# Patient Record
Sex: Male | Born: 1965 | Race: Black or African American | Hispanic: No | Marital: Married | Smoking: Never smoker
Health system: Southern US, Community
[De-identification: ages and names within clinical notes are randomized; demographics above are authoritative.]

## PROBLEM LIST (undated history)

## (undated) DIAGNOSIS — I10 Essential (primary) hypertension: Secondary | ICD-10-CM

---

## 2008-06-27 ENCOUNTER — Encounter: Admission: RE | Admit: 2008-06-27 | Discharge: 2008-06-27 | Payer: Self-pay | Admitting: Family Medicine

## 2010-06-02 ENCOUNTER — Encounter: Admission: RE | Admit: 2010-06-02 | Discharge: 2010-06-02 | Payer: Self-pay | Admitting: Orthopedic Surgery

## 2014-03-07 ENCOUNTER — Other Ambulatory Visit: Payer: Self-pay | Admitting: Family Medicine

## 2014-03-07 ENCOUNTER — Ambulatory Visit
Admission: RE | Admit: 2014-03-07 | Discharge: 2014-03-07 | Disposition: A | Discharge status: Home / Self Care | Payer: BC Managed Care – PPO | Source: Ambulatory Visit | Attending: Family Medicine | Admitting: Family Medicine

## 2014-03-07 DIAGNOSIS — IMO0001 Reserved for inherently not codable concepts without codable children: Secondary | ICD-10-CM

## 2014-03-07 DIAGNOSIS — M7918 Myalgia, other site: Secondary | ICD-10-CM

## 2014-12-01 ENCOUNTER — Other Ambulatory Visit: Payer: Self-pay | Admitting: Family Medicine

## 2014-12-01 DIAGNOSIS — K6289 Other specified diseases of anus and rectum: Secondary | ICD-10-CM

## 2014-12-01 DIAGNOSIS — R102 Pelvic and perineal pain: Secondary | ICD-10-CM

## 2014-12-08 ENCOUNTER — Ambulatory Visit
Admission: RE | Admit: 2014-12-08 | Discharge: 2014-12-08 | Disposition: A | Discharge status: Home / Self Care | Payer: BLUE CROSS/BLUE SHIELD | Source: Ambulatory Visit | Attending: Family Medicine | Admitting: Family Medicine

## 2014-12-08 ENCOUNTER — Other Ambulatory Visit: Payer: Self-pay | Admitting: Family Medicine

## 2014-12-08 DIAGNOSIS — K6289 Other specified diseases of anus and rectum: Secondary | ICD-10-CM

## 2014-12-08 DIAGNOSIS — R102 Pelvic and perineal pain: Secondary | ICD-10-CM

## 2014-12-08 MED ORDER — IOPAMIDOL (ISOVUE-300) INJECTION 61%
125.0000 mL | Freq: Once | INTRAVENOUS | Status: AC | PRN
  Administered 2014-12-08: 125 mL via INTRAVENOUS

## 2018-07-18 ENCOUNTER — Emergency Department (HOSPITAL_BASED_OUTPATIENT_CLINIC_OR_DEPARTMENT_OTHER): Payer: BLUE CROSS/BLUE SHIELD

## 2018-07-18 ENCOUNTER — Inpatient Hospital Stay (HOSPITAL_BASED_OUTPATIENT_CLINIC_OR_DEPARTMENT_OTHER)
Admission: EM | Admit: 2018-07-18 | Discharge: 2018-07-27 | DRG: 374 | Disposition: A | Discharge status: Home / Self Care | Payer: BLUE CROSS/BLUE SHIELD | Attending: Internal Medicine | Admitting: Internal Medicine

## 2018-07-18 ENCOUNTER — Other Ambulatory Visit: Payer: Self-pay

## 2018-07-18 ENCOUNTER — Encounter (HOSPITAL_BASED_OUTPATIENT_CLINIC_OR_DEPARTMENT_OTHER): Payer: Self-pay | Admitting: Emergency Medicine

## 2018-07-18 DIAGNOSIS — E875 Hyperkalemia: Secondary | ICD-10-CM | POA: Diagnosis present

## 2018-07-18 DIAGNOSIS — K8689 Other specified diseases of pancreas: Secondary | ICD-10-CM

## 2018-07-18 DIAGNOSIS — Z79899 Other long term (current) drug therapy: Secondary | ICD-10-CM

## 2018-07-18 DIAGNOSIS — N179 Acute kidney failure, unspecified: Secondary | ICD-10-CM | POA: Diagnosis present

## 2018-07-18 DIAGNOSIS — R109 Unspecified abdominal pain: Secondary | ICD-10-CM | POA: Diagnosis not present

## 2018-07-18 DIAGNOSIS — E860 Dehydration: Secondary | ICD-10-CM | POA: Diagnosis present

## 2018-07-18 DIAGNOSIS — R160 Hepatomegaly, not elsewhere classified: Secondary | ICD-10-CM | POA: Diagnosis present

## 2018-07-18 DIAGNOSIS — K640 First degree hemorrhoids: Secondary | ICD-10-CM | POA: Diagnosis present

## 2018-07-18 DIAGNOSIS — C182 Malignant neoplasm of ascending colon: Secondary | ICD-10-CM | POA: Diagnosis present

## 2018-07-18 DIAGNOSIS — K56609 Unspecified intestinal obstruction, unspecified as to partial versus complete obstruction: Secondary | ICD-10-CM | POA: Diagnosis present

## 2018-07-18 DIAGNOSIS — K668 Other specified disorders of peritoneum: Secondary | ICD-10-CM

## 2018-07-18 DIAGNOSIS — Z8042 Family history of malignant neoplasm of prostate: Secondary | ICD-10-CM

## 2018-07-18 DIAGNOSIS — Z7189 Other specified counseling: Secondary | ICD-10-CM

## 2018-07-18 DIAGNOSIS — C799 Secondary malignant neoplasm of unspecified site: Secondary | ICD-10-CM

## 2018-07-18 DIAGNOSIS — J90 Pleural effusion, not elsewhere classified: Secondary | ICD-10-CM | POA: Diagnosis present

## 2018-07-18 DIAGNOSIS — C787 Secondary malignant neoplasm of liver and intrahepatic bile duct: Secondary | ICD-10-CM

## 2018-07-18 DIAGNOSIS — Z800 Family history of malignant neoplasm of digestive organs: Secondary | ICD-10-CM

## 2018-07-18 DIAGNOSIS — K859 Acute pancreatitis without necrosis or infection, unspecified: Secondary | ICD-10-CM | POA: Diagnosis present

## 2018-07-18 DIAGNOSIS — I10 Essential (primary) hypertension: Secondary | ICD-10-CM | POA: Diagnosis present

## 2018-07-18 HISTORY — DX: Essential (primary) hypertension: I10

## 2018-07-18 LAB — COMPREHENSIVE METABOLIC PANEL
ALBUMIN: 3.3 g/dL — AB (ref 3.5–5.0)
ALK PHOS: 335 U/L — AB (ref 38–126)
ALT: 45 U/L — AB (ref 0–44)
AST: 58 U/L — AB (ref 15–41)
Anion gap: 11 (ref 5–15)
BUN: 17 mg/dL (ref 6–20)
CALCIUM: 12.6 mg/dL — AB (ref 8.9–10.3)
CO2: 23 mmol/L (ref 22–32)
Chloride: 94 mmol/L — ABNORMAL LOW (ref 98–111)
Creatinine, Ser: 1.75 mg/dL — ABNORMAL HIGH (ref 0.61–1.24)
GFR calc Af Amer: 51 mL/min — ABNORMAL LOW (ref >60)
GFR calc non Af Amer: 44 mL/min — ABNORMAL LOW (ref >60)
GLUCOSE: 115 mg/dL — AB (ref 70–99)
Potassium: 4.7 mmol/L (ref 3.5–5.1)
SODIUM: 128 mmol/L — AB (ref 135–145)
Total Bilirubin: 1 mg/dL (ref 0.3–1.2)
Total Protein: 8 g/dL (ref 6.5–8.1)

## 2018-07-18 LAB — CBC
HCT: 41 % (ref 39.0–52.0)
Hemoglobin: 13.3 g/dL (ref 13.0–17.0)
MCH: 27.5 pg (ref 26.0–34.0)
MCHC: 32.4 g/dL (ref 30.0–36.0)
MCV: 84.7 fL (ref 80.0–100.0)
PLATELETS: 409 10*3/uL — AB (ref 150–400)
RBC: 4.84 MIL/uL (ref 4.22–5.81)
RDW: 13 % (ref 11.5–15.5)
WBC: 14.1 10*3/uL — ABNORMAL HIGH (ref 4.0–10.5)
nRBC: 0 % (ref 0.0–0.2)

## 2018-07-18 LAB — LIPASE, BLOOD: Lipase: 1104 U/L — ABNORMAL HIGH (ref 11–51)

## 2018-07-18 MED ORDER — SODIUM CHLORIDE 0.9 % IV BOLUS
500.0000 mL | Freq: Once | INTRAVENOUS | Status: AC
  Administered 2018-07-18: 500 mL via INTRAVENOUS

## 2018-07-18 MED ORDER — IOPAMIDOL (ISOVUE-300) INJECTION 61%
100.0000 mL | Freq: Once | INTRAVENOUS | Status: AC | PRN
  Administered 2018-07-18: 100 mL via INTRAVENOUS

## 2018-07-18 NOTE — ED Provider Notes (Signed)
[facility] EMERGENCY DEPARTMENT Provider Note   CSN: [id number] Arrival date & time: 07/18/18  2030     History   Chief Complaint Chief Complaint  Patient presents with  . Abdominal Pain    HPI Jeffery Roman is a 52 y.o. male.  HPI Patient presents with abdominal pain in his upper abdomen for the last 3 weeks.  States he went to see his doctor and they did not find a clear cause of the pain but found out he had a likely prostatitis.  Has been on antibiotics.  Then developed some nausea and vomiting.  Thought that it may be related to the ciprofloxacin and his medicines were changed.  Now has more nausea vomiting.  Had been on omeprazole.  Pain is worse with eating.  States he has been eating less because he know what will make him hurt and potentially vomit.  States his stool has also become smaller like pellets per Past Medical History:  Diagnosis Date  . Hypertension     There are no active problems to display for this patient.   History reviewed. No pertinent surgical history.      Home Medications    Prior to Admission medications   Not on File    Family History No family history on file.  Social History Social History   Tobacco Use  . Smoking status: Never Smoker  . Smokeless tobacco: Never Used  Substance Use Topics  . Alcohol use: Not on file  . Drug use: Not on file     Allergies   Ciprofloxacin and Lisinopril   Review of Systems Review of Systems  Constitutional: Positive for appetite change. Negative for fever.  Respiratory: Negative for shortness of breath.   Cardiovascular: Negative for chest pain.  Gastrointestinal: Positive for abdominal pain.  Genitourinary: Positive for dysuria.  Musculoskeletal: Negative for back pain.  Skin: Negative for rash.  Neurological: Negative for weakness.  Hematological: Negative for adenopathy.  Psychiatric/Behavioral: Negative for confusion.     Physical Exam Updated Vital Signs BP  (!) 147/92   Pulse (!) 105   Temp 98.7 F (37.1 C) (Oral)   Resp 16   Ht 5\' 11"  (1.803 m)   Wt 89.4 kg   SpO2 98%   BMI 27.48 kg/m   Physical Exam  Constitutional: He appears well-developed.  HENT:  Head: Normocephalic.  Cardiovascular: Normal rate.  Pulmonary/Chest: Breath sounds normal.  Abdominal: Normal appearance.  Epigastric tenderness and tightness to left upper abdomen.  No fullness.  No rebound or guarding.  Neurological: He is alert.  Skin: Skin is warm. Capillary refill takes less than 2 seconds.     ED Treatments / Results  Labs (all labs ordered are listed, but only abnormal results are displayed) Labs Reviewed  LIPASE, BLOOD - Abnormal; Notable for the following components:      Result Value   Lipase 1,104 (*)    All other components within normal limits  COMPREHENSIVE METABOLIC PANEL - Abnormal; Notable for the following components:   Sodium 128 (*)    Chloride 94 (*)    Glucose, Bld 115 (*)    Creatinine, Ser 1.75 (*)    Calcium 12.6 (*)    Albumin 3.3 (*)    AST 58 (*)    ALT 45 (*)    Alkaline Phosphatase 335 (*)    GFR calc non Af Amer 44 (*)    GFR calc Af Amer 51 (*)    All other components  within normal limits  CBC - Abnormal; Notable for the following components:   WBC 14.1 (*)    Platelets 409 (*)    All other components within normal limits  URINALYSIS, ROUTINE W REFLEX MICROSCOPIC    EKG None  Radiology Ct Abdomen Pelvis W Contrast  Result Date: 07/18/2018 CLINICAL DATA:  Worsening epigastric pain for a week, vomiting today. On treatment for prostatitis. EXAM: CT ABDOMEN AND PELVIS WITH CONTRAST TECHNIQUE: Multidetector CT imaging of the abdomen and pelvis was performed using the standard protocol following bolus administration of intravenous contrast. CONTRAST:  125mL ISOVUE-300 IOPAMIDOL (ISOVUE-300) INJECTION 61% COMPARISON:  CT pelvis December 08, 2014 FINDINGS: LOWER CHEST: No pericardial effusions. Lower lobe atelectasis. Heart  size is normal HEPATOBILIARY: Numerous rounded hypodensities throughout the liver measuring to 3.7 cm. Liver size is normal. Normal gallbladder. PANCREAS: Peripancreatic inflammation without parenchymal hypodensity, ductal dilatation, abnormal calcifications or mass. SPLEEN: Normal. ADRENALS/URINARY TRACT: Kidneys are orthotopic, demonstrating symmetric enhancement. No nephrolithiasis, hydronephrosis or solid renal masses. The unopacified ureters are normal in course and caliber. Delayed imaging through the kidneys demonstrates symmetric prompt contrast excretion within the proximal urinary collecting system. Urinary bladder is partially distended and unremarkable. Normal adrenal glands. STOMACH/BOWEL: Eccentric posterior thickening of the ascending colon (axial image 61). The stomach, small bowel are normal in course and caliber without inflammatory changes. Normal appendix. VASCULAR/LYMPHATIC: Aortoiliac vessels are normal in course and caliber. No lymphadenopathy by CT size criteria. REPRODUCTIVE: Normal. OTHER: No intraperitoneal free fluid or free air. Small amount of ascites predominantly in the LEFT upper quadrant and LEFT pericolic gutter. No intraperitoneal free air or focal fluid collection. MUSCULOSKELETAL: Nonacute. Severe L5-S1 disc height loss with endplate sclerosis and vacuum disc compatible with degenerative disc resulting in severe LEFT L5-S1 neural foraminal narrowing. IMPRESSION: 1. Acute pancreatitis without necrosis. 2. Eccentric ascending colon wall thickening concerning for primary neoplasm. Multiple hepatic metastasis. Recommend colonoscopy and/or PET-CT on non emergent basis. 3. Acute findings discussed with and reconfirmed by Dr.MOLPUS on 07/18/2018 at 11:42 pm. Electronically Signed   By: Elon Alas M.D.   On: 07/18/2018 23:45    Procedures Procedures (including critical care time)  Medications Ordered in ED Medications  sodium chloride 0.9 % bolus 500 mL (0 mLs Intravenous  Stopped 07/18/18 2239)  iopamidol (ISOVUE-300) 61 % injection 100 mL (100 mLs Intravenous Contrast Given 07/18/18 2318)     Initial Impression / Assessment and Plan / ED Course  I have reviewed the triage vital signs and the nursing notes.  Pertinent labs & imaging results that were available during my care of the patient were reviewed by me and considered in my medical decision making (see chart for details).    Patient presents with abdominal pain.  Has had vomiting.  Lipase elevated on labs.  CT scan done and shows likely metastatic colon cancer liver.  Patient's pain and vomiting is made it difficult for him to tolerate orals.  Will admit to hospital for further control of his pancreatitis and work-up of his likely metastatic cancer.  Final Clinical Impressions(s) / ED Diagnoses   Final diagnoses:  Acute pancreatitis, unspecified complication status, unspecified pancreatitis type  Metastatic cancer [facility])    ED Discharge Orders    None       Davonna Belling, MD 07/19/18 0000

## 2018-07-18 NOTE — ED Notes (Signed)
Patient stated he could not urinate at this time.

## 2018-07-18 NOTE — ED Triage Notes (Addendum)
Pt reports epigastric pain this week that has increased today; vomited x 2 today; being treated for prostatitis currently and  Is taking zofran and omeprazole per MD, as they thought abd discomfort r/t abx; pt pale

## 2018-07-19 ENCOUNTER — Encounter (HOSPITAL_COMMUNITY): Payer: Self-pay | Admitting: Internal Medicine

## 2018-07-19 DIAGNOSIS — I10 Essential (primary) hypertension: Secondary | ICD-10-CM | POA: Diagnosis present

## 2018-07-19 DIAGNOSIS — N179 Acute kidney failure, unspecified: Secondary | ICD-10-CM | POA: Diagnosis present

## 2018-07-19 DIAGNOSIS — K859 Acute pancreatitis without necrosis or infection, unspecified: Secondary | ICD-10-CM | POA: Diagnosis present

## 2018-07-19 DIAGNOSIS — R109 Unspecified abdominal pain: Secondary | ICD-10-CM | POA: Diagnosis present

## 2018-07-19 DIAGNOSIS — C787 Secondary malignant neoplasm of liver and intrahepatic bile duct: Secondary | ICD-10-CM | POA: Diagnosis present

## 2018-07-19 DIAGNOSIS — E860 Dehydration: Secondary | ICD-10-CM | POA: Diagnosis present

## 2018-07-19 DIAGNOSIS — D490 Neoplasm of unspecified behavior of digestive system: Secondary | ICD-10-CM

## 2018-07-19 DIAGNOSIS — R160 Hepatomegaly, not elsewhere classified: Secondary | ICD-10-CM | POA: Diagnosis not present

## 2018-07-19 DIAGNOSIS — K640 First degree hemorrhoids: Secondary | ICD-10-CM | POA: Diagnosis present

## 2018-07-19 DIAGNOSIS — Z79899 Other long term (current) drug therapy: Secondary | ICD-10-CM | POA: Diagnosis not present

## 2018-07-19 DIAGNOSIS — C182 Malignant neoplasm of ascending colon: Secondary | ICD-10-CM | POA: Diagnosis present

## 2018-07-19 DIAGNOSIS — Z8042 Family history of malignant neoplasm of prostate: Secondary | ICD-10-CM | POA: Diagnosis not present

## 2018-07-19 DIAGNOSIS — E875 Hyperkalemia: Secondary | ICD-10-CM | POA: Diagnosis present

## 2018-07-19 DIAGNOSIS — Z800 Family history of malignant neoplasm of digestive organs: Secondary | ICD-10-CM | POA: Diagnosis not present

## 2018-07-19 DIAGNOSIS — J90 Pleural effusion, not elsewhere classified: Secondary | ICD-10-CM | POA: Diagnosis present

## 2018-07-19 DIAGNOSIS — K56609 Unspecified intestinal obstruction, unspecified as to partial versus complete obstruction: Secondary | ICD-10-CM | POA: Diagnosis not present

## 2018-07-19 LAB — CBC WITH DIFFERENTIAL/PLATELET
Abs Immature Granulocytes: 0.13 10*3/uL — ABNORMAL HIGH (ref 0.00–0.07)
Basophils Absolute: 0 10*3/uL (ref 0.0–0.1)
Basophils Relative: 0 %
EOS ABS: 0 10*3/uL (ref 0.0–0.5)
EOS PCT: 0 %
HEMATOCRIT: 38.7 % — AB (ref 39.0–52.0)
Hemoglobin: 12.4 g/dL — ABNORMAL LOW (ref 13.0–17.0)
Immature Granulocytes: 1 %
LYMPHS ABS: 1.2 10*3/uL (ref 0.7–4.0)
Lymphocytes Relative: 8 %
MCH: 27.7 pg (ref 26.0–34.0)
MCHC: 32 g/dL (ref 30.0–36.0)
MCV: 86.4 fL (ref 80.0–100.0)
MONOS PCT: 7 %
Monocytes Absolute: 1.2 10*3/uL — ABNORMAL HIGH (ref 0.1–1.0)
Neutro Abs: 13.7 10*3/uL — ABNORMAL HIGH (ref 1.7–7.7)
Neutrophils Relative %: 84 %
Platelets: 359 10*3/uL (ref 150–400)
RBC: 4.48 MIL/uL (ref 4.22–5.81)
RDW: 13.2 % (ref 11.5–15.5)
WBC: 16.3 10*3/uL — ABNORMAL HIGH (ref 4.0–10.5)
nRBC: 0 % (ref 0.0–0.2)

## 2018-07-19 LAB — BASIC METABOLIC PANEL
ANION GAP: 9 (ref 5–15)
ANION GAP: 6 (ref 5–15)
BUN: 17 mg/dL (ref 6–20)
BUN: 17 mg/dL (ref 6–20)
CALCIUM: 11.5 mg/dL — AB (ref 8.9–10.3)
CALCIUM: 10.6 mg/dL — AB (ref 8.9–10.3)
CO2: 21 mmol/L — ABNORMAL LOW (ref 22–32)
CO2: 22 mmol/L (ref 22–32)
Chloride: 99 mmol/L (ref 98–111)
Chloride: 102 mmol/L (ref 98–111)
Creatinine, Ser: 1.63 mg/dL — ABNORMAL HIGH (ref 0.61–1.24)
Creatinine, Ser: 1.54 mg/dL — ABNORMAL HIGH (ref 0.61–1.24)
GFR calc Af Amer: 56 mL/min — ABNORMAL LOW (ref >60)
GFR calc Af Amer: 60 mL/min — ABNORMAL LOW (ref >60)
GFR calc non Af Amer: 51 mL/min — ABNORMAL LOW (ref >60)
GFR, EST NON AFRICAN AMERICAN: 48 mL/min — AB (ref >60)
GLUCOSE: 96 mg/dL (ref 70–99)
GLUCOSE: 89 mg/dL (ref 70–99)
POTASSIUM: 5.6 mmol/L — AB (ref 3.5–5.1)
Potassium: 6 mmol/L — ABNORMAL HIGH (ref 3.5–5.1)
SODIUM: 129 mmol/L — AB (ref 135–145)
SODIUM: 130 mmol/L — AB (ref 135–145)

## 2018-07-19 LAB — HEPATIC FUNCTION PANEL
ALBUMIN: 2.9 g/dL — AB (ref 3.5–5.0)
ALK PHOS: 283 U/L — AB (ref 38–126)
ALT: 41 U/L (ref 0–44)
AST: 59 U/L — ABNORMAL HIGH (ref 15–41)
BILIRUBIN DIRECT: 0.4 mg/dL — AB (ref 0.0–0.2)
BILIRUBIN INDIRECT: 0.7 mg/dL (ref 0.3–0.9)
TOTAL PROTEIN: 7.2 g/dL (ref 6.5–8.1)
Total Bilirubin: 1.1 mg/dL (ref 0.3–1.2)

## 2018-07-19 LAB — URINALYSIS, ROUTINE W REFLEX MICROSCOPIC
Bilirubin Urine: NEGATIVE
GLUCOSE, UA: NEGATIVE mg/dL
Ketones, ur: NEGATIVE mg/dL
LEUKOCYTES UA: NEGATIVE
Nitrite: NEGATIVE
Protein, ur: 30 mg/dL — AB
Specific Gravity, Urine: 1.025 (ref 1.005–1.030)
pH: 5 (ref 5.0–8.0)

## 2018-07-19 LAB — URINALYSIS, MICROSCOPIC (REFLEX): BACTERIA UA: NONE SEEN

## 2018-07-19 LAB — TROPONIN I: Troponin I: <0.03 ng/mL (ref <0.03)

## 2018-07-19 LAB — HIV ANTIBODY (ROUTINE TESTING W REFLEX): HIV SCREEN 4TH GENERATION: NONREACTIVE

## 2018-07-19 LAB — MAGNESIUM: Magnesium: 1.9 mg/dL (ref 1.7–2.4)

## 2018-07-19 LAB — TRIGLYCERIDES: Triglycerides: 43 mg/dL (ref <150)

## 2018-07-19 MED ORDER — SODIUM CHLORIDE 0.9 % IV BOLUS
1000.0000 mL | Freq: Once | INTRAVENOUS | Status: AC
  Administered 2018-07-19: 1000 mL via INTRAVENOUS

## 2018-07-19 MED ORDER — ACETAMINOPHEN 650 MG RE SUPP: 650.0000 mg | Freq: Four times a day (QID) | RECTAL | Status: DC | PRN

## 2018-07-19 MED ORDER — SODIUM CHLORIDE 0.9 % IV SOLN: INTRAVENOUS | Status: DC

## 2018-07-19 MED ORDER — ACETAMINOPHEN 325 MG PO TABS
650.0000 mg | ORAL_TABLET | Freq: Four times a day (QID) | ORAL | Status: DC | PRN
  Administered 2018-07-21 (×2): 650 mg via ORAL
  Filled 2018-07-19 (×2): qty 2

## 2018-07-19 MED ORDER — FENTANYL CITRATE (PF) 100 MCG/2ML IJ SOLN
50.0000 ug | INTRAMUSCULAR | Status: DC | PRN
  Administered 2018-07-19 – 2018-07-21 (×16): 50 ug via INTRAVENOUS
  Filled 2018-07-19 (×17): qty 2

## 2018-07-19 MED ORDER — PEG 3350-KCL-NA BICARB-NACL 420 G PO SOLR
4000.0000 mL | Freq: Once | ORAL | Status: AC
  Administered 2018-07-19: 4000 mL via ORAL

## 2018-07-19 MED ORDER — LACTATED RINGERS IV SOLN: INTRAVENOUS | Status: DC

## 2018-07-19 MED ORDER — SODIUM CHLORIDE 0.9 % IV BOLUS
2000.0000 mL | Freq: Once | INTRAVENOUS | Status: AC
  Administered 2018-07-19: 2000 mL via INTRAVENOUS

## 2018-07-19 MED ORDER — FENTANYL CITRATE (PF) 100 MCG/2ML IJ SOLN
50.0000 ug | Freq: Once | INTRAMUSCULAR | Status: AC
  Administered 2018-07-19: 50 ug via INTRAVENOUS
  Filled 2018-07-19: qty 2

## 2018-07-19 MED ORDER — ONDANSETRON HCL 4 MG/2ML IJ SOLN
4.0000 mg | Freq: Once | INTRAMUSCULAR | Status: AC
  Administered 2018-07-19: 4 mg via INTRAVENOUS
  Filled 2018-07-19: qty 2

## 2018-07-19 MED ORDER — ADULT MULTIVITAMIN W/MINERALS CH
1.0000 | ORAL_TABLET | Freq: Every day | ORAL | Status: DC
  Administered 2018-07-21 – 2018-07-25 (×3): 1 via ORAL
  Filled 2018-07-19 (×5): qty 1

## 2018-07-19 MED ORDER — ONDANSETRON HCL 4 MG/2ML IJ SOLN
4.0000 mg | Freq: Four times a day (QID) | INTRAMUSCULAR | Status: DC | PRN
  Administered 2018-07-19 – 2018-07-27 (×13): 4 mg via INTRAVENOUS
  Filled 2018-07-19 (×15): qty 2

## 2018-07-19 MED ORDER — LACTATED RINGERS IV SOLN
INTRAVENOUS | Status: DC
  Administered 2018-07-19: 04:00:00 via INTRAVENOUS

## 2018-07-19 MED ORDER — SODIUM POLYSTYRENE SULFONATE 15 GM/60ML PO SUSP
15.0000 g | Freq: Once | ORAL | Status: AC
  Administered 2018-07-19: 15 g via ORAL
  Filled 2018-07-19: qty 60

## 2018-07-19 MED ORDER — ENSURE ENLIVE PO LIQD
237.0000 mL | Freq: Two times a day (BID) | ORAL | Status: DC
  Administered 2018-07-21 – 2018-07-23 (×2): 237 mL via ORAL

## 2018-07-19 MED ORDER — ZOLPIDEM TARTRATE 5 MG PO TABS
5.0000 mg | ORAL_TABLET | Freq: Every evening | ORAL | Status: DC | PRN
  Administered 2018-07-20: 5 mg via ORAL
  Filled 2018-07-19: qty 1

## 2018-07-19 MED ORDER — HYDRALAZINE HCL 20 MG/ML IJ SOLN
10.0000 mg | INTRAMUSCULAR | Status: DC | PRN
  Administered 2018-07-20 – 2018-07-21 (×3): 10 mg via INTRAVENOUS
  Filled 2018-07-19 (×3): qty 1

## 2018-07-19 MED ORDER — ONDANSETRON HCL 4 MG PO TABS
4.0000 mg | ORAL_TABLET | Freq: Four times a day (QID) | ORAL | Status: DC | PRN
  Administered 2018-07-26: 4 mg via ORAL
  Filled 2018-07-19: qty 1

## 2018-07-19 MED ORDER — SODIUM CHLORIDE 0.9 % IV SOLN
INTRAVENOUS | Status: AC
  Administered 2018-07-19 – 2018-07-20 (×3): via INTRAVENOUS

## 2018-07-19 NOTE — Progress Notes (Signed)
Initial Nutrition Assessment  DOCUMENTATION CODES:   (Will assess for malnutrition at follow-up)  INTERVENTION:  - Diet advancement as medically feasible._0 - Will order Ensure Enlive BID for once diet advanced to at least FLD, each supplement provides 350 kcal and 20 grams of protein. - Will order daily multivitamins with minerals.   NUTRITION DIAGNOSIS:   Increased nutrient needs related to acute illness, catabolic illness(pancreatitis, colonic mass with liver lesions) as evidenced by estimated needs.  GOAL:   Patient will meet greater than or equal to 90% of their needs  MONITOR:   Diet advancement, PO intake, Supplement acceptance, Weight trends, Labs  REASON FOR ASSESSMENT:   Malnutrition Screening Tool  ASSESSMENT:   52 y.o. male past medical history of HTN. He presented to the ED with abdominal pain. He recently saw his PCP and was started on antibiotics with no improvement so he presented to the ED.  He was found to be in ARF with mild pancreatitis. CT scan of the abdomen pelvis showed pancreatitis with a colonic mass with multiple metastatic lesions to the liver.  BMI indicates overweight status. Diet was advanced from NPO to CLD at 10:17 AM today; no intakes documented. Patient was sleeping soundly at the time of RD visit ~30 minutes ago. Did not want to arouse patient. No family/visitors present.   Very limited weight hx available in the chart. Current weight is 195 lb and weight on 01/27/15 at [facility] was 209 lb. This indicates a 14 lb (6.7% body weight) weight difference.   Per Dr. Barbra Sarks note this AM: acute pancreatitis with plan for aggressive hydration, AKI, hyperkalemia with plan for Kayexalate and then recheck BMP this afternoon, colonic neoplasm with metastatic liver disease with possible plan for colonoscopy and consult to GI for possible endoscopy and biopsy, hypercalemia likely 2/2 dehydration.    Medications reviewed; 15 mL oral Kayexalate x1 dose  today. Labs reviewed; Na: 129 mmol/L, K: 6 mmol/L, creatinine: 1.63 mg/dL, Ca: 11.5 mg/dL, Alk Phos elevated, AST elevated, GFR: 48 mL/min. IVF; NS @ 125 mL/hr.     NUTRITION - FOCUSED PHYSICAL EXAM:  Unable to complete at this time and will attempt at follow-up.   Diet Order:   Diet Order            Diet NPO time specified  Diet effective midnight        Diet clear liquid Room service appropriate? Yes; Fluid consistency: Thin  Diet effective now              EDUCATION NEEDS:   No education needs have been identified at this time  Skin:  Skin Assessment: Reviewed RN Assessment  Last BM:  PTA/unknown  Height:   Ht Readings from Last 1 Encounters:  07/19/18 5' 10.98" (1.803 m)    Weight:   Wt Readings from Last 1 Encounters:  07/19/18 88.6 kg    Ideal Body Weight:  78.18 kg  BMI:  Body mass index is 27.25 kg/m.  Estimated Nutritional Needs:   Kcal:  2305-2570 (26-29 kcal/kg)  Protein:  120-130 grams  Fluid:  >/= 2.2 L/day     Jarome Matin, MS, RD, LDN, [facility] Inpatient Clinical Dietitian Pager # [phone] After hours/weekend pager # [phone]

## 2018-07-19 NOTE — H&P (Signed)
History and Physical    [name]:[id number] DOB: 26-Jun-1966 DOA: 07/18/2018  PCP: Alroy Dust, L.Marlou Sa, MD  Patient coming from: Home.  Chief Complaint: Abdominal pain.  HPI: Jeffery Roman is a 52 y.o. male with history of hypertension has been experiencing abdominal pain over the last 3 weeks.  Had gone to his PCP and was empirically started on antibiotics for possible prostatitis.  Initially was on Cipro which was discontinued due to intolerance and was changed to Bactrim.  Patient states despite taking antibiotics his abdominal pain has persistently and progressively worsened.  Has been having some nausea at times vomiting.  Denies any diarrhea.  Pain is mostly in the epigastric area moving across the upper abdomen.  Denies any chest pain shortness of breath fever chills.  Pain acutely worsened yesterday and decided to come to the ER at [facility]  ED Course: In the ER patient's labs show acute renal failure with creatinine of 1.7 hypercalcemia with calcium of 12.2 and lipase of 2104 with AST of 58 ALT of 45 total bilirubin of 1.  CT abdomen and pelvis done shows features concerning for acute pancreatitis and also features concerning for colonic neoplasm with metastasis.  Patient was given fluid bolus and fentanyl for pain and admitted for further management of acute pancreatitis and possible colonic mass with metastasis.  On exam patient has abdominal tenderness which is mostly in the epigastric and right lower quadrant.  No rigidity or rebound tenderness.  Review of Systems: As per HPI, rest all negative.   Past Medical History:  Diagnosis Date  . Hypertension     History reviewed. No pertinent surgical history.   reports that he has never smoked. He has never used smokeless tobacco. His alcohol and drug histories are not on file.  Allergies  Allergen Reactions  . Ciprofloxacin Other (See Comments)    Too strong   . Lisinopril Other (See Comments)    ED     Family History  Problem Relation Age of Onset  . Diabetes Mellitus II Mother   . Prostate cancer Father   . Colon cancer Maternal Grandmother     Prior to Admission medications   Medication Sig Start Date End Date Taking? Authorizing Provider  amLODipine (NORVASC) 5 MG tablet Take 5 mg by mouth daily. 04/30/18  Yes [provider]  hydrochlorothiazide (HYDRODIURIL) 12.5 MG tablet Take 12.5 mg by mouth daily. 04/30/18  Yes [provider]  loratadine (CLARITIN) 10 MG tablet Take 10 mg by mouth daily.   Yes [provider]  Multiple Vitamin (MULTIVITAMIN WITH MINERALS) TABS tablet Take 1 tablet by mouth daily.   Yes [provider]  omeprazole (PRILOSEC) 20 MG capsule Take 20 mg by mouth daily.   Yes [provider]  ondansetron (ZOFRAN) 8 MG tablet Take 8 mg by mouth 2 (two) times daily as needed for nausea or vomiting.  07/09/18  Yes [provider]  Oxygen Permeable Lens Products (RENU REWETTING DROPS) SOLN Place 1 drop into both eyes daily as needed (dry eyes).   Yes [provider]  sulfamethoxazole-trimethoprim (BACTRIM DS,SEPTRA DS) 800-160 MG tablet Take 1 tablet by mouth 2 (two) times daily. 07/09/18  Yes [provider]    Physical Exam: Vitals:   07/18/18 2245 07/18/18 2315 07/19/18 0205 07/19/18 0237  BP: (!) 141/75 (!) 147/92 139/87   Pulse: (!) 103 (!) 105 (!) 103   Resp: (!) 38 16 18   Temp:  98.9 F (37.2 C)   TempSrc:   Oral   SpO2: 100% 98% 99%   Weight:    88.6 kg  Height:    5' 10.98" (1.803 m)      Constitutional: Moderately built and well-nourished. Vitals:   07/18/18 2245 07/18/18 2315 07/19/18 0205 07/19/18 0237  BP: (!) 141/75 (!) 147/92 139/87   Pulse: (!) 103 (!) 105 (!) 103   Resp: (!) 38 16 18   Temp:   98.9 F (37.2 C)   TempSrc:   Oral   SpO2: 100% 98% 99%   Weight:    88.6 kg  Height:    5' 10.98" (1.803 m)   Eyes: Anicteric no pallor. ENMT: No discharge from the  ears eyes nose or mouth. Neck: No mass felt.  No neck rigidity, no JVD appreciated. Respiratory: No rhonchi or crepitations. Cardiovascular: S1-S2 heard no murmurs appreciated. Abdomen: Soft tenderness in the epigastric and right lower quadrant no guarding no rigidity no rebound tenderness. Musculoskeletal: No edema. Skin: No rash. Neurologic: Alert awake oriented to time place and person.  Moves all extremities. Psychiatric: Appears normal.  Normal affect.   Labs on Admission: I have personally reviewed following labs and imaging studies  CBC: Recent Labs  Lab 07/18/18 2114  WBC 14.1*  HGB 13.3  HCT 41.0  MCV 84.7  PLT 030*   Basic Metabolic Panel: Recent Labs  Lab 07/18/18 2114  NA 128*  K 4.7  CL 94*  CO2 23  GLUCOSE 115*  BUN 17  CREATININE 1.75*  CALCIUM 12.6*   GFR: Estimated Creatinine Clearance: 54.4 mL/min (A) (by C-G formula based on SCr of 1.75 mg/dL (H)). Liver Function Tests: Recent Labs  Lab 07/18/18 2114  AST 58*  ALT 45*  ALKPHOS 335*  BILITOT 1.0  PROT 8.0  ALBUMIN 3.3*   Recent Labs  Lab 07/18/18 2114  LIPASE 1,104*   No results for input(s): AMMONIA in the last 168 hours. Coagulation Profile: No results for input(s): INR, PROTIME in the last 168 hours. Cardiac Enzymes: No results for input(s): CKTOTAL, CKMB, CKMBINDEX, TROPONINI in the last 168 hours. BNP (last 3 results) No results for input(s): PROBNP in the last 8760 hours. HbA1C: No results for input(s): HGBA1C in the last 72 hours. CBG: No results for input(s): GLUCAP in the last 168 hours. Lipid Profile: No results for input(s): CHOL, HDL, LDLCALC, TRIG, CHOLHDL, LDLDIRECT in the last 72 hours. Thyroid Function Tests: No results for input(s): TSH, T4TOTAL, FREET4, T3FREE, THYROIDAB in the last 72 hours. Anemia Panel: No results for input(s): VITAMINB12, FOLATE, FERRITIN, TIBC, IRON, RETICCTPCT in the last 72 hours. Urine analysis:    Component Value Date/Time    COLORURINE YELLOW 07/19/2018 0030   APPEARANCEUR CLEAR 07/19/2018 0030   LABSPEC 1.025 07/19/2018 0030   PHURINE 5.0 07/19/2018 0030   GLUCOSEU NEGATIVE 07/19/2018 0030   HGBUR SMALL (A) 07/19/2018 0030   BILIRUBINUR NEGATIVE 07/19/2018 0030   KETONESUR NEGATIVE 07/19/2018 0030   PROTEINUR 30 (A) 07/19/2018 0030   NITRITE NEGATIVE 07/19/2018 0030   LEUKOCYTESUR NEGATIVE 07/19/2018 0030   Sepsis Labs: @LABRCNTIP (procalcitonin:4,lacticidven:4) )No results found for this or any previous visit (from the past 240 hour(s)).   Radiological Exams on Admission: Ct Abdomen Pelvis W Contrast  Result Date: 07/18/2018 CLINICAL DATA:  Worsening epigastric pain for a week, vomiting today. On treatment for prostatitis. EXAM: CT ABDOMEN AND PELVIS WITH CONTRAST TECHNIQUE: Multidetector CT imaging of the abdomen and pelvis was performed using the standard protocol  following bolus administration of intravenous contrast. CONTRAST:  179mL ISOVUE-300 IOPAMIDOL (ISOVUE-300) INJECTION 61% COMPARISON:  CT pelvis December 08, 2014 FINDINGS: LOWER CHEST: No pericardial effusions. Lower lobe atelectasis. Heart size is normal HEPATOBILIARY: Numerous rounded hypodensities throughout the liver measuring to 3.7 cm. Liver size is normal. Normal gallbladder. PANCREAS: Peripancreatic inflammation without parenchymal hypodensity, ductal dilatation, abnormal calcifications or mass. SPLEEN: Normal. ADRENALS/URINARY TRACT: Kidneys are orthotopic, demonstrating symmetric enhancement. No nephrolithiasis, hydronephrosis or solid renal masses. The unopacified ureters are normal in course and caliber. Delayed imaging through the kidneys demonstrates symmetric prompt contrast excretion within the proximal urinary collecting system. Urinary bladder is partially distended and unremarkable. Normal adrenal glands. STOMACH/BOWEL: Eccentric posterior thickening of the ascending colon (axial image 61). The stomach, small bowel are normal in course  and caliber without inflammatory changes. Normal appendix. VASCULAR/LYMPHATIC: Aortoiliac vessels are normal in course and caliber. No lymphadenopathy by CT size criteria. REPRODUCTIVE: Normal. OTHER: No intraperitoneal free fluid or free air. Small amount of ascites predominantly in the LEFT upper quadrant and LEFT pericolic gutter. No intraperitoneal free air or focal fluid collection. MUSCULOSKELETAL: Nonacute. Severe L5-S1 disc height loss with endplate sclerosis and vacuum disc compatible with degenerative disc resulting in severe LEFT L5-S1 neural foraminal narrowing. IMPRESSION: 1. Acute pancreatitis without necrosis. 2. Eccentric ascending colon wall thickening concerning for primary neoplasm. Multiple hepatic metastasis. Recommend colonoscopy and/or PET-CT on non emergent basis. 3. Acute findings discussed with and reconfirmed by Dr.MOLPUS on 07/18/2018 at 11:42 pm. Electronically Signed   By: Elon Alas M.D.   On: 07/18/2018 23:45    EKG: Independently reviewed.  Normal sinus rhythm with QTC of 390 ms.  QRS of 80 ms.  Assessment/Plan Principal Problem:   Acute pancreatitis Active Problems:   Hypercalcemia   Colon neoplasm   ARF (acute renal failure) (HCC)   Essential hypertension    1. Acute pancreatitis cause not clear.  Check triglycerides.  CT does not show any gallstones.  Patient does not drink alcohol.  Continue with aggressive hydration and follow labs and pain relief medications to be continued. 2. Abnormal CT findings concerning for colonic mass with metastasis -need GI consult. 3. Hypertension since patient is n.p.o. we will keep patient on PRN IV hydralazine. 4. Acute renal failure likely from nausea vomiting and dehydration and patient also was on hydrochlorothiazide lisinopril and Bactrim.  Continue with aggressive hydration and follow metabolic panel intake output. 5. Hypercalcemia likely from dehydration or possible secondary to possible malignancy.  Continue with  aggressive hydration recheck metabolic panel.  EKG does not show anything acute.   DVT prophylaxis: SCDs for now. Code Status: Full code. Family Communication: Patient's wife. Disposition Plan: Home. Consults called: None. Admission status: Inpatient.   [name] MD [name] Pager [phone].  If 7PM-7AM, please contact night-coverage [email or web address] Password [email or web address]  07/19/2018, 3:18 AM

## 2018-07-19 NOTE — Consult Note (Signed)
Referring Provider: Dr. Aileen Fass Primary Care Physician:  Alroy Dust, Carlean Jews.Marlou Sa, MD Primary Gastroenterologist:  Althia Forts  Reason for Consultation:  Abdominal pain; Colon mass  HPI: Jeffery Roman is a 52 y.o. male with a 3 week history of crampy upper quadrant abdominal pain. Poor appetite during this time and had N/V X 2 days. 8 pound unintentional weight loss. Reports being diagnosed with prostatitis during this time and being placed on Cipro. BMs have decreased in volume and size but denies rectal bleeding, melena, or hematochezia. Hgb 13.3, WBC 14.1 yesterday and 16.3 today. Lipas 1,104, TB 1, ALP 335, AST 58, ALT 45. CT scan showed an ascending colon mass with liver mets and acute pancreatitis. Paternal grandmother had colon cancer in her 16's and father has a history of prostate cancer. He has not had a colonoscopy.  Past Medical History:  Diagnosis Date  . Hypertension     History reviewed. No pertinent surgical history.  Prior to Admission medications   Medication Sig Start Date End Date Taking? Authorizing Provider  amLODipine (NORVASC) 5 MG tablet Take 5 mg by mouth daily. 04/30/18  Yes [provider]  hydrochlorothiazide (HYDRODIURIL) 12.5 MG tablet Take 12.5 mg by mouth daily. 04/30/18  Yes [provider]  loratadine (CLARITIN) 10 MG tablet Take 10 mg by mouth daily.   Yes [provider]  Multiple Vitamin (MULTIVITAMIN WITH MINERALS) TABS tablet Take 1 tablet by mouth daily.   Yes [provider]  omeprazole (PRILOSEC) 20 MG capsule Take 20 mg by mouth daily.   Yes [provider]  ondansetron (ZOFRAN) 8 MG tablet Take 8 mg by mouth 2 (two) times daily as needed for nausea or vomiting.  07/09/18  Yes [provider]  Oxygen Permeable Lens Products (RENU REWETTING DROPS) SOLN Place 1 drop into both eyes daily as needed (dry eyes).   Yes [provider]  sulfamethoxazole-trimethoprim (BACTRIM DS,SEPTRA DS) 800-160 MG  tablet Take 1 tablet by mouth 2 (two) times daily. 07/09/18  Yes [provider]    Scheduled Meds: Continuous Infusions: . sodium chloride    . sodium chloride 2,000 mL (07/19/18 0859)   PRN Meds:.acetaminophen **OR** acetaminophen, fentaNYL (SUBLIMAZE) injection, hydrALAZINE, ondansetron **OR** ondansetron (ZOFRAN) IV, zolpidem  Allergies as of 07/18/2018 - Review Complete 07/18/2018  Allergen Reaction Noted  . Ciprofloxacin  07/18/2018  . Lisinopril  07/18/2018    Family History  Problem Relation Age of Onset  . Diabetes Mellitus II Mother   . Prostate cancer Father   . Colon cancer Maternal Grandmother     Social History   Socioeconomic History  . Marital status: Married    Spouse name: Not on file  . Number of children: Not on file  . Years of education: Not on file  . Highest education level: Not on file  Occupational History  . Not on file  Social Needs  . Financial resource strain: Not on file  . Food insecurity:    Worry: Not on file    Inability: Not on file  . Transportation needs:    Medical: Not on file    Non-medical: Not on file  Tobacco Use  . Smoking status: Never Smoker  . Smokeless tobacco: Never Used  Substance and Sexual Activity  . Alcohol use: Not on file  . Drug use: Not on file  . Sexual activity: Not on file  Lifestyle  . Physical activity:    Days per week: Not on file    Minutes per  session: Not on file  . Stress: Not on file  Relationships  . Social connections:    Talks on phone: Not on file    Gets together: Not on file    Attends religious service: Not on file    Active member of club or organization: Not on file    Attends meetings of clubs or organizations: Not on file    Relationship status: Not on file  . Intimate partner violence:    Fear of current or ex partner: Not on file    Emotionally abused: Not on file    Physically abused: Not on file    Forced sexual activity: Not on file  Other Topics Concern   . Not on file  Social History Narrative  . Not on file    Review of Systems: All negative except as stated above in HPI.  Physical Exam: Vital signs: Vitals:   07/19/18 0205 07/19/18 0400  BP: 139/87 (!) 144/86  Pulse: (!) 103 (!) 106  Resp: 18 18  Temp: 98.9 F (37.2 C) 99.4 F (37.4 C)  SpO2: 99% 94%     General:   Lethargic, Well-developed, well-nourished, pleasant and cooperative in NAD Head: normocephalic, atraumatic Eyes: anicteric sclera ENT: oropharynx clear Neck: supple, nontender Lungs:  Clear throughout to auscultation.   No wheezes, crackles, or rhonchi. No acute distress. Heart:  Regular rate and rhythm; no murmurs, clicks, rubs,  or gallops. Abdomen: upper quadrant (greatest in RUQ) tenderness with guarding, soft, nondistended, +BS  Rectal:  Deferred Ext: no edema  GI:  Lab Results: Recent Labs    07/18/18 2114 07/19/18 0543  WBC 14.1* 16.3*  HGB 13.3 12.4*  HCT 41.0 38.7*  PLT 409* 359   BMET Recent Labs    07/18/18 2114 07/19/18 0543  NA 128* 129*  K 4.7 6.0*  CL 94* 99  CO2 23 21*  GLUCOSE 115* 96  BUN 17 17  CREATININE 1.75* 1.63*  CALCIUM 12.6* 11.5*   LFT Recent Labs    07/19/18 0543  PROT 7.2  ALBUMIN 2.9*  AST 59*  ALT 41  ALKPHOS 283*  BILITOT 1.1  BILIDIR 0.4*  IBILI 0.7   PT/INR No results for input(s): LABPROT, INR in the last 72 hours.   Studies/Results: Ct Abdomen Pelvis W Contrast  Result Date: 07/18/2018 CLINICAL DATA:  Worsening epigastric pain for a week, vomiting today. On treatment for prostatitis. EXAM: CT ABDOMEN AND PELVIS WITH CONTRAST TECHNIQUE: Multidetector CT imaging of the abdomen and pelvis was performed using the standard protocol following bolus administration of intravenous contrast. CONTRAST:  152mL ISOVUE-300 IOPAMIDOL (ISOVUE-300) INJECTION 61% COMPARISON:  CT pelvis December 08, 2014 FINDINGS: LOWER CHEST: No pericardial effusions. Lower lobe atelectasis. Heart size is normal HEPATOBILIARY:  Numerous rounded hypodensities throughout the liver measuring to 3.7 cm. Liver size is normal. Normal gallbladder. PANCREAS: Peripancreatic inflammation without parenchymal hypodensity, ductal dilatation, abnormal calcifications or mass. SPLEEN: Normal. ADRENALS/URINARY TRACT: Kidneys are orthotopic, demonstrating symmetric enhancement. No nephrolithiasis, hydronephrosis or solid renal masses. The unopacified ureters are normal in course and caliber. Delayed imaging through the kidneys demonstrates symmetric prompt contrast excretion within the proximal urinary collecting system. Urinary bladder is partially distended and unremarkable. Normal adrenal glands. STOMACH/BOWEL: Eccentric posterior thickening of the ascending colon (axial image 61). The stomach, small bowel are normal in course and caliber without inflammatory changes. Normal appendix. VASCULAR/LYMPHATIC: Aortoiliac vessels are normal in course and caliber. No lymphadenopathy by CT size criteria. REPRODUCTIVE: Normal. OTHER: No intraperitoneal free fluid  or free air. Small amount of ascites predominantly in the LEFT upper quadrant and LEFT pericolic gutter. No intraperitoneal free air or focal fluid collection. MUSCULOSKELETAL: Nonacute. Severe L5-S1 disc height loss with endplate sclerosis and vacuum disc compatible with degenerative disc resulting in severe LEFT L5-S1 neural foraminal narrowing. IMPRESSION: 1. Acute pancreatitis without necrosis. 2. Eccentric ascending colon wall thickening concerning for primary neoplasm. Multiple hepatic metastasis. Recommend colonoscopy and/or PET-CT on non emergent basis. 3. Acute findings discussed with and reconfirmed by Dr.MOLPUS on 07/18/2018 at 11:42 pm. Electronically Signed   By: Elon Alas M.D.   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 Colon prep today. Clear liquid diet. NPO p MN. Supportive  care.    LOS: 0 days   [name]  07/19/2018, 10:14 AM  Questions please call [phone]

## 2018-07-19 NOTE — Progress Notes (Addendum)
[name] PROGRESS NOTE    Progress Note  Jeffery Roman  EUM:[id number] DOB: [birth year] DOA: 07/18/2018 PCP: Alroy Dust, L.Marlou Sa, MD     Brief Narrative:   Jeffery Roman is an 52 y.o. male past medical history of hypertension comes in complaining of abdominal pain, she recently seen her PCP and was started on.  Antibiotics no improvement came into the ER was found to be in acute renal failure with mild pancreatitis calcium of 12.6 bilirubin of 1, CT scan of the abdomen pelvis showed pancreatitis with a colonic mass with multiple metastatic lesions to the liver.  Assessment/Plan:   Acute pancreatitis: We will hydrate aggressively, continue to follow strict I's and O's Daily weights. Continue narcotics for pain place her n.p.o. Discontinue lactated Ringer's, start him on normal saline.  Acute kidney injury: With an unknown baseline, on admission 1.5, today 1.6 we will give him 2 L of normal saline continue IV fluids recheck a basic metabolic panel this afternoon.  Hyperkalemia: Give oral Kayexalate check a 12-lead EKG. Check a basic metabolic panel this afternoon.  Colonic neoplasm with metastatic liver disease: Hemoglobin is 12.4, we will consult for possible colonoscopy Consult GI, for possible endoscopy and biopsy.  Hypercalcemia Likely due to dehydration is improving with IV fluids.  Essential hypertension Hold antihypertensive medication.   DVT prophylaxis: SCD Family Communication:None Disposition Plan/Barrier to D/C: unable to determine Code Status:     Code Status Orders  (From admission, onward)         Start     Ordered   07/19/18 0316  Full code  Continuous     07/19/18 0317        Code Status History    This patient has a current code status but no historical code status.        IV Access:    Peripheral IV   Procedures and diagnostic studies:   Ct Abdomen Pelvis W Contrast  Result Date: 07/18/2018 CLINICAL DATA:  Worsening  epigastric pain for a week, vomiting today. On treatment for prostatitis. EXAM: CT ABDOMEN AND PELVIS WITH CONTRAST TECHNIQUE: Multidetector CT imaging of the abdomen and pelvis was performed using the standard protocol following bolus administration of intravenous contrast. CONTRAST:  145mL ISOVUE-300 IOPAMIDOL (ISOVUE-300) INJECTION 61% COMPARISON:  CT pelvis December 08, 2014 FINDINGS: LOWER CHEST: No pericardial effusions. Lower lobe atelectasis. Heart size is normal HEPATOBILIARY: Numerous rounded hypodensities throughout the liver measuring to 3.7 cm. Liver size is normal. Normal gallbladder. PANCREAS: Peripancreatic inflammation without parenchymal hypodensity, ductal dilatation, abnormal calcifications or mass. SPLEEN: Normal. ADRENALS/URINARY TRACT: Kidneys are orthotopic, demonstrating symmetric enhancement. No nephrolithiasis, hydronephrosis or solid renal masses. The unopacified ureters are normal in course and caliber. Delayed imaging through the kidneys demonstrates symmetric prompt contrast excretion within the proximal urinary collecting system. Urinary bladder is partially distended and unremarkable. Normal adrenal glands. STOMACH/BOWEL: Eccentric posterior thickening of the ascending colon (axial image 61). The stomach, small bowel are normal in course and caliber without inflammatory changes. Normal appendix. VASCULAR/LYMPHATIC: Aortoiliac vessels are normal in course and caliber. No lymphadenopathy by CT size criteria. REPRODUCTIVE: Normal. OTHER: No intraperitoneal free fluid or free air. Small amount of ascites predominantly in the LEFT upper quadrant and LEFT pericolic gutter. No intraperitoneal free air or focal fluid collection. MUSCULOSKELETAL: Nonacute. Severe L5-S1 disc height loss with endplate sclerosis and vacuum disc compatible with degenerative disc resulting in severe LEFT L5-S1 neural foraminal narrowing. IMPRESSION: 1. Acute pancreatitis without necrosis. 2. Eccentric ascending colon  wall  thickening concerning for primary neoplasm. Multiple hepatic metastasis. Recommend colonoscopy and/or PET-CT on non emergent basis. 3. Acute findings discussed with and reconfirmed by Dr.MOLPUS on 07/18/2018 at 11:42 pm. Electronically Signed   By: Elon Alas M.D.   On: 07/18/2018 23:45     Medical Consultants:    None.  Anti-Infectives:   None  Subjective:    Jeffery Roman relates he continues to have epigastric pain mild nausea but no vomiting.  Objective:    Vitals:   07/18/18 2315 07/19/18 0205 07/19/18 0237 07/19/18 0400  BP: (!) 147/92 139/87  (!) 144/86  Pulse: (!) 105 (!) 103  (!) 106  Resp: 16 18  18   Temp:  98.9 F (37.2 C)  99.4 F (37.4 C)  TempSrc:  Oral  Oral  SpO2: 98% 99%  94%  Weight:   88.6 kg   Height:   5' 10.98" (1.803 m)     Intake/Output Summary (Last 24 hours) at 07/19/2018 0758 Last data filed at 07/19/2018 0200 Gross per 24 hour  Intake 0 ml  Output -  Net 0 ml   Filed Weights   07/18/18 2057 07/19/18 0237  Weight: 89.4 kg 88.6 kg    Exam: General exam: In no acute distress. Respiratory system: Good air movement and clear to auscultation. Cardiovascular system: S1 & S2 heard, RRR. Gastrointestinal system: Abdomen is nondistended, soft and nontender.  Central nervous system: Alert and oriented. No focal neurological deficits. Extremities: No pedal edema. Skin: No rashes, lesions or ulcers Psychiatry: Judgement and insight appear normal. Mood & affect appropriate.    Data Reviewed:    Labs: Basic Metabolic Panel: Recent Labs  Lab 07/18/18 2114 07/19/18 0543  NA 128* 129*  K 4.7 6.0*  CL 94* 99  CO2 23 21*  GLUCOSE 115* 96  BUN 17 17  CREATININE 1.75* 1.63*  CALCIUM 12.6* 11.5*  MG  --  1.9   GFR Estimated Creatinine Clearance: 58.4 mL/min (A) (by C-G formula based on SCr of 1.63 mg/dL (H)). Liver Function Tests: Recent Labs  Lab 07/18/18 2114 07/19/18 0543  AST 58* 59*  ALT 45* 41  ALKPHOS 335*  283*  BILITOT 1.0 1.1  PROT 8.0 7.2  ALBUMIN 3.3* 2.9*   Recent Labs  Lab 07/18/18 2114  LIPASE 1,104*   No results for input(s): AMMONIA in the last 168 hours. Coagulation profile No results for input(s): INR, PROTIME in the last 168 hours.  CBC: Recent Labs  Lab 07/18/18 2114 07/19/18 0543  WBC 14.1* 16.3*  NEUTROABS  --  13.7*  HGB 13.3 12.4*  HCT 41.0 38.7*  MCV 84.7 86.4  PLT 409* 359   Cardiac Enzymes: Recent Labs  Lab 07/19/18 0543  TROPONINI <0.03   BNP (last 3 results) No results for input(s): PROBNP in the last 8760 hours. CBG: No results for input(s): GLUCAP in the last 168 hours. D-Dimer: No results for input(s): DDIMER in the last 72 hours. Hgb A1c: No results for input(s): HGBA1C in the last 72 hours. Lipid Profile: Recent Labs    07/19/18 0543  TRIG 43   Thyroid function studies: No results for input(s): TSH, T4TOTAL, T3FREE, THYROIDAB in the last 72 hours.  Invalid input(s): FREET3 Anemia work up: No results for input(s): VITAMINB12, FOLATE, FERRITIN, TIBC, IRON, RETICCTPCT in the last 72 hours. Sepsis Labs: Recent Labs  Lab 07/18/18 2114 07/19/18 0543  WBC 14.1* 16.3*   Microbiology No results found for this or any previous visit (from the past  240 hour(s)).   Medications:    Continuous Infusions: . sodium chloride    . lactated ringers 150 mL/hr at 07/19/18 0358  . sodium chloride       LOS: 0 days   Charlynne Cousins  [name] Pager [phone]  *Please refer to [email or web address], password [email or web address] to get updated schedule on who will round on this patient, as hospitalists switch teams weekly. If 7PM-7AM, please contact night-coverage at [email or web address], password [email or web address] for any overnight needs.  07/19/2018, 7:58 AM

## 2018-07-20 ENCOUNTER — Encounter (HOSPITAL_COMMUNITY)
Admission: EM | Disposition: A | Discharge status: Home / Self Care | Payer: Self-pay | Source: Home / Self Care | Attending: Internal Medicine

## 2018-07-20 ENCOUNTER — Encounter (HOSPITAL_COMMUNITY): Payer: Self-pay | Admitting: Anesthesiology

## 2018-07-20 ENCOUNTER — Inpatient Hospital Stay (HOSPITAL_COMMUNITY): Payer: BLUE CROSS/BLUE SHIELD

## 2018-07-20 LAB — COMPREHENSIVE METABOLIC PANEL
ALBUMIN: 2.5 g/dL — AB (ref 3.5–5.0)
ALT: 36 U/L (ref 0–44)
ANION GAP: 7 (ref 5–15)
AST: 69 U/L — ABNORMAL HIGH (ref 15–41)
Alkaline Phosphatase: 262 U/L — ABNORMAL HIGH (ref 38–126)
BUN: 16 mg/dL (ref 6–20)
CO2: 21 mmol/L — ABNORMAL LOW (ref 22–32)
Calcium: 10.1 mg/dL (ref 8.9–10.3)
Chloride: 104 mmol/L (ref 98–111)
Creatinine, Ser: 1.36 mg/dL — ABNORMAL HIGH (ref 0.61–1.24)
GFR calc Af Amer: >60 mL/min (ref >60)
GFR, EST NON AFRICAN AMERICAN: 60 mL/min — AB (ref >60)
GLUCOSE: 71 mg/dL (ref 70–99)
POTASSIUM: 5.8 mmol/L — AB (ref 3.5–5.1)
SODIUM: 132 mmol/L — AB (ref 135–145)
TOTAL PROTEIN: 6 g/dL — AB (ref 6.5–8.1)
Total Bilirubin: 2.4 mg/dL — ABNORMAL HIGH (ref 0.3–1.2)

## 2018-07-20 LAB — CBC
HCT: 34.6 % — ABNORMAL LOW (ref 39.0–52.0)
Hemoglobin: 11 g/dL — ABNORMAL LOW (ref 13.0–17.0)
MCH: 27.9 pg (ref 26.0–34.0)
MCHC: 31.8 g/dL (ref 30.0–36.0)
MCV: 87.8 fL (ref 80.0–100.0)
NRBC: 0 % (ref 0.0–0.2)
PLATELETS: 291 10*3/uL (ref 150–400)
RBC: 3.94 MIL/uL — ABNORMAL LOW (ref 4.22–5.81)
RDW: 13.2 % (ref 11.5–15.5)
WBC: 15.6 10*3/uL — AB (ref 4.0–10.5)

## 2018-07-20 LAB — LIPASE, BLOOD: Lipase: 214 U/L — ABNORMAL HIGH (ref 11–51)

## 2018-07-20 SURGERY — COLONOSCOPY WITH PROPOFOL
Anesthesia: Monitor Anesthesia Care

## 2018-07-20 MED ORDER — SODIUM CHLORIDE 0.9 % IV SOLN
INTRAVENOUS | Status: AC
  Administered 2018-07-20 (×2): via INTRAVENOUS

## 2018-07-20 NOTE — Progress Notes (Addendum)
Patient unable to drink the entire Golytely due to nausea, cramping, and firm abdomen. He was only able to drink a little over 1 liter. Stool is still Dills and lumpy.

## 2018-07-20 NOTE — Consult Note (Signed)
[facility] Surgery Consult Note  Jeffery Roman 02-08-1966  [id number].    Requesting MD: Wilford Corner Chief Complaint/Reason for Consult: colon mass  HPI:  Jeffery Roman is a 52yo male admitted to [facility] 10/23 for pancreatitis and newly found ascending colon mass and liver metastasis. States that 3 weeks ago he started having upper abdominal pain. Initially seen by PCP who felt this was prostatitis and started him on cipro. Since that time his pain has gradually gotten worse. He has had associated nausea, vomiting, and abdominal bloating. Pain became severe on Wednesday so he went to the ED. CT scan was performed and showed Acute pancreatitis without necrosis, as well as eccentric ascending colon wall thickening concerning for primary neoplasm with multiple hepatic metastasis. GI was consulted and planned to perform a colonoscopy today, but patient was unable to tolerate colon prep. Last BM was 2 days ago, but he is still passing flatus. Prior to admission he reports noticing a change in his stools caliber, smaller than normal. Denies any recent weight loss. He has never had a colonoscopy before. No known family h/o colon cancer.  PMH significant for HTN Abdominal surgical history: none Anticoagulants: none Nonsmoker Employment: IT  ROS: Review of Systems  Constitutional: Negative.   HENT: Negative.   Eyes: Negative.   Respiratory: Negative.   Cardiovascular: Negative.   Gastrointestinal: Positive for abdominal pain, constipation, nausea and vomiting. Negative for blood in stool and melena.  Genitourinary: Negative.   Musculoskeletal: Negative.   Skin: Negative.   Neurological: Negative.    All systems reviewed and otherwise negative except for as above  Family History  Problem Relation Age of Onset  . Diabetes Mellitus II Mother   . Prostate cancer Father   . Colon cancer Maternal Grandmother     Past Medical History:  Diagnosis Date  . Hypertension     History  reviewed. No pertinent surgical history.  Social History:  reports that he has never smoked. He has never used smokeless tobacco. His alcohol and drug histories are not on file.  Allergies:  Allergies  Allergen Reactions  . Ciprofloxacin Other (See Comments)    Too strong   . Lisinopril Other (See Comments)    ED    Medications Prior to Admission  Medication Sig Dispense Refill  . amLODipine (NORVASC) 5 MG tablet Take 5 mg by mouth daily.    . hydrochlorothiazide (HYDRODIURIL) 12.5 MG tablet Take 12.5 mg by mouth daily.    Marland Kitchen loratadine (CLARITIN) 10 MG tablet Take 10 mg by mouth daily.    . Multiple Vitamin (MULTIVITAMIN WITH MINERALS) TABS tablet Take 1 tablet by mouth daily.    Marland Kitchen omeprazole (PRILOSEC) 20 MG capsule Take 20 mg by mouth daily.    . ondansetron (ZOFRAN) 8 MG tablet Take 8 mg by mouth 2 (two) times daily as needed for nausea or vomiting.   0  . Oxygen Permeable Lens Products (RENU REWETTING DROPS) SOLN Place 1 drop into both eyes daily as needed (dry eyes).    Marland Kitchen sulfamethoxazole-trimethoprim (BACTRIM DS,SEPTRA DS) 800-160 MG tablet Take 1 tablet by mouth 2 (two) times daily.  0    Prior to Admission medications   Medication Sig Start Date End Date Taking? Authorizing Provider  amLODipine (NORVASC) 5 MG tablet Take 5 mg by mouth daily. 04/30/18  Yes [provider]  hydrochlorothiazide (HYDRODIURIL) 12.5 MG tablet Take 12.5 mg by mouth daily. 04/30/18  Yes [provider]  loratadine (CLARITIN) 10 MG tablet  Take 10 mg by mouth daily.   Yes [provider]  Multiple Vitamin (MULTIVITAMIN WITH MINERALS) TABS tablet Take 1 tablet by mouth daily.   Yes [provider]  omeprazole (PRILOSEC) 20 MG capsule Take 20 mg by mouth daily.   Yes [provider]  ondansetron (ZOFRAN) 8 MG tablet Take 8 mg by mouth 2 (two) times daily as needed for nausea or vomiting.  07/09/18  Yes [provider]  Oxygen Permeable Lens Products (RENU  REWETTING DROPS) SOLN Place 1 drop into both eyes daily as needed (dry eyes).   Yes [provider]  sulfamethoxazole-trimethoprim (BACTRIM DS,SEPTRA DS) 800-160 MG tablet Take 1 tablet by mouth 2 (two) times daily. 07/09/18  Yes [provider]    Blood pressure 140/83, pulse (!) 114, temperature 99.1 F (37.3 C), temperature source Oral, resp. rate 20, height 5' 10.98" (1.803 m), weight 91.5 kg, SpO2 93 %. Physical Exam: General: pleasant, WD/WN AA male who is laying in bed in NAD HEENT: head is normocephalic, atraumatic.  Sclera are noninjected.  Pupils equal and round.  Ears and nose without any masses or lesions.  Mouth is pink and moist. Dentition fair Heart: tachy, regular rhythma.  No obvious murmurs, gallops, or rubs noted.  Palpable pedal pulses bilaterally Lungs: CTAB, no wheezes, rhonchi, or rales noted.  Respiratory effort nonlabored Abd: soft, distended, few BS heard, no masses, hernias, or organomegaly. TTP across upper abdomen without rebound or guarding MS: all 4 extremities are symmetrical with no cyanosis, clubbing, or edema. Skin: warm and dry with no masses, lesions, or rashes Psych: A&Ox3 with an appropriate affect. Neuro: cranial nerves grossly intact, extremity CSM intact bilaterally, normal speech  Results for orders placed or performed during the hospital encounter of 07/18/18 (from the past 48 hour(s))  Lipase, blood     Status: Abnormal   Collection Time: 07/18/18  9:14 PM  Result Value Ref Range   Lipase 1,104 (H) 11 - 51 U/L    Comment: RESULTS CONFIRMED BY MANUAL DILUTION Performed at [facility], [address] [place], [place] [address]   Comprehensive metabolic panel     Status: Abnormal   Collection Time: 07/18/18  9:14 PM  Result Value Ref Range   Sodium 128 (L) 135 - 145 mmol/L   Potassium 4.7 3.5 - 5.1 mmol/L   Chloride 94 (L) 98 - 111 mmol/L   CO2 23 22 - 32 mmol/L   Glucose, Bld 115 (H) 70 - 99 mg/dL   BUN 17 6 -  20 mg/dL   Creatinine, Ser 1.75 (H) 0.61 - 1.24 mg/dL   Calcium 12.6 (H) 8.9 - 10.3 mg/dL   Total Protein 8.0 6.5 - 8.1 g/dL   Albumin 3.3 (L) 3.5 - 5.0 g/dL   AST 58 (H) 15 - 41 U/L   ALT 45 (H) 0 - 44 U/L   Alkaline Phosphatase 335 (H) 38 - 126 U/L   Total Bilirubin 1.0 0.3 - 1.2 mg/dL   GFR calc non Af Amer 44 (L) >60 mL/min   GFR calc Af Amer 51 (L) >60 mL/min    Comment: (NOTE) The eGFR has been calculated using the CKD EPI equation. This calculation has not been validated in all clinical situations. eGFR's persistently <60 mL/min signify possible Chronic Kidney Disease.    Anion gap 11 5 - 15    Comment: Performed at [facility], [address] [place], [place] [address]  CBC  Status: Abnormal   Collection Time: 07/18/18  9:14 PM  Result Value Ref Range   WBC 14.1 (H) 4.0 - 10.5 K/uL   RBC 4.84 4.22 - 5.81 MIL/uL   Hemoglobin 13.3 13.0 - 17.0 g/dL   HCT 41.0 39.0 - 52.0 %   MCV 84.7 80.0 - 100.0 fL   MCH 27.5 26.0 - 34.0 pg   MCHC 32.4 30.0 - 36.0 g/dL   RDW 13.0 11.5 - 15.5 %   Platelets 409 (H) 150 - 400 K/uL   nRBC 0.0 0.0 - 0.2 %    Comment: Performed at [facility], [address] [place], [place] [address]  Urinalysis, Routine w reflex microscopic     Status: Abnormal   Collection Time: 07/19/18 12:30 AM  Result Value Ref Range   Color, Urine YELLOW YELLOW   APPearance CLEAR CLEAR   Specific Gravity, Urine 1.025 1.005 - 1.030   pH 5.0 5.0 - 8.0   Glucose, UA NEGATIVE NEGATIVE mg/dL   Hgb urine dipstick SMALL (A) NEGATIVE   Bilirubin Urine NEGATIVE NEGATIVE   Ketones, ur NEGATIVE NEGATIVE mg/dL   Protein, ur 30 (A) NEGATIVE mg/dL   Nitrite NEGATIVE NEGATIVE   Leukocytes, UA NEGATIVE NEGATIVE    Comment: Performed at [facility], [address] [place], [place] [address]  Urinalysis, Microscopic (reflex)     Status: None   Collection Time: 07/19/18 12:30 AM  Result Value Ref Range   RBC / HPF 0-5 0 - 5 RBC/hpf    WBC, UA 0-5 0 - 5 WBC/hpf   Bacteria, UA NONE SEEN NONE SEEN   Squamous Epithelial / LPF 0-5 0 - 5   Hyaline Casts, UA PRESENT    Granular Casts, UA PRESENT     Comment: Performed at [facility], [address] [place], [place] [address]  HIV antibody (Routine Testing)     Status: None   Collection Time: 07/19/18  5:43 AM  Result Value Ref Range   HIV Screen 4th Generation wRfx Non Reactive Non Reactive    Comment: (NOTE) Performed At: [facility] [place], [place] [id number] Rush Farmer MD WI:[id number]   Hepatic function panel     Status: Abnormal   Collection Time: 07/19/18  5:43 AM  Result Value Ref Range   Total Protein 7.2 6.5 - 8.1 g/dL   Albumin 2.9 (L) 3.5 - 5.0 g/dL   AST 59 (H) 15 - 41 U/L   ALT 41 0 - 44 U/L   Alkaline Phosphatase 283 (H) 38 - 126 U/L   Total Bilirubin 1.1 0.3 - 1.2 mg/dL   Bilirubin, Direct 0.4 (H) 0.0 - 0.2 mg/dL   Indirect Bilirubin 0.7 0.3 - 0.9 mg/dL    Comment: Performed at [facility], [place] [address] [place], [place] [address]  Basic metabolic panel     Status: Abnormal   Collection Time: 07/19/18  5:43 AM  Result Value Ref Range   Sodium 129 (L) 135 - 145 mmol/L   Potassium 6.0 (H) 3.5 - 5.1 mmol/L   Chloride 99 98 - 111 mmol/L   CO2 21 (L) 22 - 32 mmol/L   Glucose, Bld 96 70 - 99 mg/dL   BUN 17 6 - 20 mg/dL   Creatinine, Ser 1.63 (H) 0.61 - 1.24 mg/dL   Calcium 11.5 (H) 8.9 - 10.3 mg/dL   GFR calc non Af Amer 48 (L) >60 mL/min   GFR  calc Af Amer 56 (L) >60 mL/min    Comment: (NOTE) The eGFR has been calculated using the CKD EPI equation. This calculation has not been validated in all clinical situations. eGFR's persistently <60 mL/min signify possible Chronic Kidney Disease.    Anion gap 9 5 - 15    Comment: Performed at [facility], [place] [address], [place], [place] [address]  Magnesium     Status: None   Collection Time: 07/19/18  5:43 AM  Result  Value Ref Range   Magnesium 1.9 1.7 - 2.4 mg/dL    Comment: Performed at [facility], [place] [address], [place], [place] [address]  CBC WITH DIFFERENTIAL     Status: Abnormal   Collection Time: 07/19/18  5:43 AM  Result Value Ref Range   WBC 16.3 (H) 4.0 - 10.5 K/uL   RBC 4.48 4.22 - 5.81 MIL/uL   Hemoglobin 12.4 (L) 13.0 - 17.0 g/dL   HCT 38.7 (L) 39.0 - 52.0 %   MCV 86.4 80.0 - 100.0 fL   MCH 27.7 26.0 - 34.0 pg   MCHC 32.0 30.0 - 36.0 g/dL   RDW 13.2 11.5 - 15.5 %   Platelets 359 150 - 400 K/uL   nRBC 0.0 0.0 - 0.2 %   Neutrophils Relative % 84 %   Neutro Abs 13.7 (H) 1.7 - 7.7 K/uL   Lymphocytes Relative 8 %   Lymphs Abs 1.2 0.7 - 4.0 K/uL   Monocytes Relative 7 %   Monocytes Absolute 1.2 (H) 0.1 - 1.0 K/uL   Eosinophils Relative 0 %   Eosinophils Absolute 0.0 0.0 - 0.5 K/uL   Basophils Relative 0 %   Basophils Absolute 0.0 0.0 - 0.1 K/uL   Immature Granulocytes 1 %   Abs Immature Granulocytes 0.13 (H) 0.00 - 0.07 K/uL    Comment: Performed at [facility], [place] [address] [place], [place] [address]  Troponin I     Status: None   Collection Time: 07/19/18  5:43 AM  Result Value Ref Range   Troponin I <0.03 <0.03 ng/mL    Comment: Performed at [facility], [place] [address], [place], [place] [address]  Triglycerides     Status: None   Collection Time: 07/19/18  5:43 AM  Result Value Ref Range   Triglycerides 43 <150 mg/dL    Comment: Performed at [facility], [place] [address], [place], [place] [address]  Basic metabolic panel     Status: Abnormal   Collection Time: 07/19/18  2:49 PM  Result Value Ref Range   Sodium 130 (L) 135 - 145 mmol/L   Potassium 5.6 (H) 3.5 - 5.1 mmol/L   Chloride 102 98 - 111 mmol/L   CO2 22 22 - 32 mmol/L   Glucose, Bld 89 70 - 99 mg/dL   BUN 17 6 - 20 mg/dL   Creatinine, Ser 1.54 (H) 0.61 - 1.24 mg/dL   Calcium 10.6 (H) 8.9 - 10.3 mg/dL   GFR calc non Af Amer 51 (L)  >60 mL/min   GFR calc Af Amer 60 (L) >60 mL/min    Comment: (NOTE) The eGFR has been calculated using the CKD EPI equation. This calculation has not been validated in all clinical situations. eGFR's persistently <60 mL/min signify possible Chronic Kidney Disease.    Anion gap 6 5 - 15    Comment: Performed at [facility], [place] [address] [place], [place] [address]   Dg  Abd 1 View - Kub  Result Date: 07/20/2018 CLINICAL DATA:  Evaluate for free peritoneal air. EXAM: ABDOMEN - 1 VIEW COMPARISON:  CT 07/18/2018 FINDINGS: Moderate air and contrast throughout the colon. No definite free peritoneal air. Remainder the exam is unremarkable. IMPRESSION: Nonspecific, nonobstructive bowel gas pattern. No definite free peritoneal air. Recommend upright KUB with diaphragms in field of view versus right lateral decubitus film for better evaluation for free air. Electronically Signed   By: Marin Olp M.D.   On: 07/20/2018 11:03   Ct Abdomen Pelvis W Contrast  Result Date: 07/18/2018 CLINICAL DATA:  Worsening epigastric pain for a week, vomiting today. On treatment for prostatitis. EXAM: CT ABDOMEN AND PELVIS WITH CONTRAST TECHNIQUE: Multidetector CT imaging of the abdomen and pelvis was performed using the standard protocol following bolus administration of intravenous contrast. CONTRAST:  161m ISOVUE-300 IOPAMIDOL (ISOVUE-300) INJECTION 61% COMPARISON:  CT pelvis December 08, 2014 FINDINGS: LOWER CHEST: No pericardial effusions. Lower lobe atelectasis. Heart size is normal HEPATOBILIARY: Numerous rounded hypodensities throughout the liver measuring to 3.7 cm. Liver size is normal. Normal gallbladder. PANCREAS: Peripancreatic inflammation without parenchymal hypodensity, ductal dilatation, abnormal calcifications or mass. SPLEEN: Normal. ADRENALS/URINARY TRACT: Kidneys are orthotopic, demonstrating symmetric enhancement. No nephrolithiasis, hydronephrosis or solid renal masses. The  unopacified ureters are normal in course and caliber. Delayed imaging through the kidneys demonstrates symmetric prompt contrast excretion within the proximal urinary collecting system. Urinary bladder is partially distended and unremarkable. Normal adrenal glands. STOMACH/BOWEL: Eccentric posterior thickening of the ascending colon (axial image 61). The stomach, small bowel are normal in course and caliber without inflammatory changes. Normal appendix. VASCULAR/LYMPHATIC: Aortoiliac vessels are normal in course and caliber. No lymphadenopathy by CT size criteria. REPRODUCTIVE: Normal. OTHER: No intraperitoneal free fluid or free air. Small amount of ascites predominantly in the LEFT upper quadrant and LEFT pericolic gutter. No intraperitoneal free air or focal fluid collection. MUSCULOSKELETAL: Nonacute. Severe L5-S1 disc height loss with endplate sclerosis and vacuum disc compatible with degenerative disc resulting in severe LEFT L5-S1 neural foraminal narrowing. IMPRESSION: 1. Acute pancreatitis without necrosis. 2. Eccentric ascending colon wall thickening concerning for primary neoplasm. Multiple hepatic metastasis. Recommend colonoscopy and/or PET-CT on non emergent basis. 3. Acute findings discussed with and reconfirmed by Dr.MOLPUS on 07/18/2018 at 11:42 pm. Electronically Signed   By: CElon AlasM.D.   On: 07/18/2018 23:45   Anti-infectives (From admission, onward)   None        Assessment/Plan HTN  Acute pancreatitis Ascending colon mass, liver metastasis  - Patient with newly found likely colon cancer with metastasis to his liver. Supposed to have colonoscopy today but did not tolerate colon prep, possibly because of his pancreatitis. He is distended and tender in his upper abdomen but has no signs of peritonitis. Would recommend continuing treatment for his pancreatitis and once this resolves repeat colon prep and attempt colonoscopy. He does not need an urgent operation. It would be  beneficial to have this done prior to surgery in order to take a biopsy and evaluate the entirety of his colon.   ID - none VTE - SCDs FEN - IVF, NPO Foley - none  BWellington Hampshire PLa [facility] Inter[facility] Hospital[facility] 07/20/2018, 12:12 PM Pager: [phone] 7:00 am -11:30 AM Tues-Fri 7:00 am-4:30 pm Sat-Sun 7:00 am-11:30 am

## 2018-07-20 NOTE — Progress Notes (Signed)
Notified endoscopy that patient was only able to tolerate drinking a small amount of bowel prep. Patient reported that is last bowel movement was 07/19/18 prior to drinking bowel prep, he is passing gas, bowel is distended. Patient remains NPO and waiting for instruction from GI.

## 2018-07-20 NOTE — Progress Notes (Signed)
[facility] Gastroenterology Progress Note  Jeffery Roman 52 y.o. 1965/10/01   Subjective: Complaining of abdominal pain. No BMs overnight with bowel prep. Abdominal distention. Wife at bedside.  Objective: Vital signs: Vitals:   07/19/18 2049 07/20/18 0522  BP: (!) 155/95 140/83  Pulse: (!) 107 (!) 114  Resp: 20 20  Temp: 99 F (37.2 C) 99.1 F (37.3 C)  SpO2: 98% 93%    Physical Exam: Gen: lethargic, no acute distress  HEENT: anicteric sclera CV: RRR Chest: CTA B Abd: diffuse tenderness with guarding, distended, high-pitched bowel sounds, no rebound Ext: no edema  Lab Results: Recent Labs    07/19/18 0543 07/19/18 1449  NA 129* 130*  K 6.0* 5.6*  CL 99 102  CO2 21* 22  GLUCOSE 96 89  BUN 17 17  CREATININE 1.63* 1.54*  CALCIUM 11.5* 10.6*  MG 1.9  --    Recent Labs    07/18/18 2114 07/19/18 0543  AST 58* 59*  ALT 45* 41  ALKPHOS 335* 283*  BILITOT 1.0 1.1  PROT 8.0 7.2  ALBUMIN 3.3* 2.9*   Recent Labs    07/18/18 2114 07/19/18 0543  WBC 14.1* 16.3*  NEUTROABS  --  13.7*  HGB 13.3 12.4*  HCT 41.0 38.7*  MCV 84.7 86.4  PLT 409* 359      Assessment/Plan: 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 Cancel colonoscopy. Surgery consult called and discussed with Dr. Orest Dikes PA. Bedrest. Supportive care.   Jeffery Roman 07/20/2018, 11:35 AM  Questions please call [phone] ID: Jeffery Roman, male   DOB: 1966/09/23, 52 y.o.   MRN: [id number]

## 2018-07-20 NOTE — Progress Notes (Signed)
[name] PROGRESS NOTE    Progress Note  Jeffery Roman  YIR:[id number] DOB: [birth year] DOA: 07/18/2018 PCP: Alroy Dust, L.Marlou Sa, MD     Brief Narrative:   Jeffery Roman is an 52 y.o. male past medical history of hypertension comes in complaining of abdominal pain, she recently seen her PCP and was started on.  Antibiotics no improvement came into the ER was found to be in acute renal failure with mild pancreatitis calcium of 12.6 bilirubin of 1, CT scan of the abdomen pelvis showed pancreatitis with a colonic mass with multiple metastatic lesions to the liver.  Assessment/Plan:   Acute pancreatitis: We will hydrate aggressively, continue to follow strict I's and O's Daily weights. Continue narcotics for pain place her n.p.o.  Acute kidney injury: Likely prerenal in etiology, unknown baseline creatinine is improving slowly with IV fluids we will continue IV hydration check a basic metabolic panel in the morning.  Hyperkalemia: No changes on EKG his potassium is improving slowly.  Colonic neoplasm with metastatic liver disease: Hemoglobin is 12.4, we will consult for possible colonoscopy Consult GI, did not finish his prep we have notified GI, KUB was obtained.  Hypercalcemia Likely due to dehydration is improving with IV fluids.  Essential hypertension Hold antihypertensive medication.   DVT prophylaxis: SCD Family Communication:None Disposition Plan/Barrier to D/C: unable to determine Code Status:     Code Status Orders  (From admission, onward)         Start     Ordered   07/19/18 0316  Full code  Continuous     07/19/18 0317        Code Status History    This patient has a current code status but no historical code status.        IV Access:    Peripheral IV   Procedures and diagnostic studies:   Ct Abdomen Pelvis W Contrast  Result Date: 07/18/2018 CLINICAL DATA:  Worsening epigastric pain for a week, vomiting today. On treatment for  prostatitis. EXAM: CT ABDOMEN AND PELVIS WITH CONTRAST TECHNIQUE: Multidetector CT imaging of the abdomen and pelvis was performed using the standard protocol following bolus administration of intravenous contrast. CONTRAST:  181mL ISOVUE-300 IOPAMIDOL (ISOVUE-300) INJECTION 61% COMPARISON:  CT pelvis December 08, 2014 FINDINGS: LOWER CHEST: No pericardial effusions. Lower lobe atelectasis. Heart size is normal HEPATOBILIARY: Numerous rounded hypodensities throughout the liver measuring to 3.7 cm. Liver size is normal. Normal gallbladder. PANCREAS: Peripancreatic inflammation without parenchymal hypodensity, ductal dilatation, abnormal calcifications or mass. SPLEEN: Normal. ADRENALS/URINARY TRACT: Kidneys are orthotopic, demonstrating symmetric enhancement. No nephrolithiasis, hydronephrosis or solid renal masses. The unopacified ureters are normal in course and caliber. Delayed imaging through the kidneys demonstrates symmetric prompt contrast excretion within the proximal urinary collecting system. Urinary bladder is partially distended and unremarkable. Normal adrenal glands. STOMACH/BOWEL: Eccentric posterior thickening of the ascending colon (axial image 61). The stomach, small bowel are normal in course and caliber without inflammatory changes. Normal appendix. VASCULAR/LYMPHATIC: Aortoiliac vessels are normal in course and caliber. No lymphadenopathy by CT size criteria. REPRODUCTIVE: Normal. OTHER: No intraperitoneal free fluid or free air. Small amount of ascites predominantly in the LEFT upper quadrant and LEFT pericolic gutter. No intraperitoneal free air or focal fluid collection. MUSCULOSKELETAL: Nonacute. Severe L5-S1 disc height loss with endplate sclerosis and vacuum disc compatible with degenerative disc resulting in severe LEFT L5-S1 neural foraminal narrowing. IMPRESSION: 1. Acute pancreatitis without necrosis. 2. Eccentric ascending colon wall thickening concerning for primary neoplasm. Multiple  hepatic metastasis.  Recommend colonoscopy and/or PET-CT on non emergent basis. 3. Acute findings discussed with and reconfirmed by Dr.MOLPUS on 07/18/2018 at 11:42 pm. Electronically Signed   By: Elon Alas M.D.   On: 07/18/2018 23:45     Medical Consultants:    None.  Anti-Infectives:   None  Subjective:    Jeffery Roman she feels bloated started throwing up about a third the way during his prep.  Objective:    Vitals:   07/19/18 0400 07/19/18 1321 07/19/18 2049 07/20/18 0522  BP: (!) 144/86 (!) 151/102 (!) 155/95 140/83  Pulse: (!) 106 (!) 103 (!) 107 (!) 114  Resp: 18  20 20   Temp: 99.4 F (37.4 C) 98.8 F (37.1 C) 99 F (37.2 C) 99.1 F (37.3 C)  TempSrc: Oral Oral Oral Oral  SpO2: 94% 94% 98% 93%  Weight:    91.5 kg  Height:        Intake/Output Summary (Last 24 hours) at 07/20/2018 1057 Last data filed at 07/20/2018 0359 Gross per 24 hour  Intake 2043.54 ml  Output -  Net 2043.54 ml   Filed Weights   07/18/18 2057 07/19/18 0237 07/20/18 0522  Weight: 89.4 kg 88.6 kg 91.5 kg    Exam: General exam: In no acute distress. Respiratory system: Good air movement and clear to auscultation. Cardiovascular system: S1 & S2 heard, RRR. Gastrointestinal system: Abdomen is nondistended, soft and nontender.  Central nervous system: Alert and oriented. No focal neurological deficits. Extremities: No pedal edema. Skin: No rashes, lesions or ulcers Psychiatry: Judgement and insight appear normal. Mood & affect appropriate.    Data Reviewed:    Labs: Basic Metabolic Panel: Recent Labs  Lab 07/18/18 2114 07/19/18 0543 07/19/18 1449  NA 128* 129* 130*  K 4.7 6.0* 5.6*  CL 94* 99 102  CO2 23 21* 22  GLUCOSE 115* 96 89  BUN 17 17 17   CREATININE 1.75* 1.63* 1.54*  CALCIUM 12.6* 11.5* 10.6*  MG  --  1.9  --    GFR Estimated Creatinine Clearance: 67.1 mL/min (A) (by C-G formula based on SCr of 1.54 mg/dL (H)). Liver Function Tests: Recent Labs    Lab 07/18/18 2114 07/19/18 0543  AST 58* 59*  ALT 45* 41  ALKPHOS 335* 283*  BILITOT 1.0 1.1  PROT 8.0 7.2  ALBUMIN 3.3* 2.9*   Recent Labs  Lab 07/18/18 2114  LIPASE 1,104*   No results for input(s): AMMONIA in the last 168 hours. Coagulation profile No results for input(s): INR, PROTIME in the last 168 hours.  CBC: Recent Labs  Lab 07/18/18 2114 07/19/18 0543  WBC 14.1* 16.3*  NEUTROABS  --  13.7*  HGB 13.3 12.4*  HCT 41.0 38.7*  MCV 84.7 86.4  PLT 409* 359   Cardiac Enzymes: Recent Labs  Lab 07/19/18 0543  TROPONINI <0.03   BNP (last 3 results) No results for input(s): PROBNP in the last 8760 hours. CBG: No results for input(s): GLUCAP in the last 168 hours. D-Dimer: No results for input(s): DDIMER in the last 72 hours. Hgb A1c: No results for input(s): HGBA1C in the last 72 hours. Lipid Profile: Recent Labs    07/19/18 0543  TRIG 43   Thyroid function studies: No results for input(s): TSH, T4TOTAL, T3FREE, THYROIDAB in the last 72 hours.  Invalid input(s): FREET3 Anemia work up: No results for input(s): VITAMINB12, FOLATE, FERRITIN, TIBC, IRON, RETICCTPCT in the last 72 hours. Sepsis Labs: Recent Labs  Lab 07/18/18 2114 07/19/18 0543  WBC  14.1* 16.3*   Microbiology No results found for this or any previous visit (from the past 240 hour(s)).   Medications:   . feeding supplement (ENSURE ENLIVE)  237 mL Oral BID BM  . multivitamin with minerals  1 tablet Oral Daily   Continuous Infusions: . sodium chloride       LOS: 1 day   Charlynne Cousins  [name] Pager [phone]  *Please refer to [email or web address], password [email or web address] to get updated schedule on who will round on this patient, as hospitalists switch teams weekly. If 7PM-7AM, please contact night-coverage at [email or web address], password [email or web address] for any overnight needs.  07/20/2018, 10:57 AM

## 2018-07-21 ENCOUNTER — Encounter (HOSPITAL_COMMUNITY): Payer: Self-pay | Admitting: Certified Registered Nurse Anesthetist

## 2018-07-21 DIAGNOSIS — K56609 Unspecified intestinal obstruction, unspecified as to partial versus complete obstruction: Secondary | ICD-10-CM | POA: Diagnosis present

## 2018-07-21 DIAGNOSIS — R160 Hepatomegaly, not elsewhere classified: Secondary | ICD-10-CM | POA: Diagnosis present

## 2018-07-21 LAB — CEA: CEA: 33.7 ng/mL — ABNORMAL HIGH (ref 0.0–4.7)

## 2018-07-21 MED ORDER — POLYETHYLENE GLYCOL 3350 17 G PO PACK
34.0000 g | PACK | Freq: Two times a day (BID) | ORAL | Status: DC
  Administered 2018-07-21 (×2): 34 g via ORAL
  Filled 2018-07-21 (×3): qty 2

## 2018-07-21 MED ORDER — BISACODYL 10 MG RE SUPP
10.0000 mg | Freq: Every day | RECTAL | Status: DC
  Administered 2018-07-21 – 2018-07-25 (×3): 10 mg via RECTAL
  Filled 2018-07-21 (×7): qty 1

## 2018-07-21 MED ORDER — SODIUM BICARBONATE 650 MG PO TABS
650.0000 mg | ORAL_TABLET | Freq: Two times a day (BID) | ORAL | Status: AC
  Administered 2018-07-21 (×2): 650 mg via ORAL
  Filled 2018-07-21 (×2): qty 1

## 2018-07-21 MED ORDER — SODIUM CHLORIDE 0.9 % IV SOLN
INTRAVENOUS | Status: DC
  Administered 2018-07-21: 16:00:00 via INTRAVENOUS

## 2018-07-21 MED ORDER — LACTATED RINGERS IV BOLUS
1000.0000 mL | Freq: Once | INTRAVENOUS | Status: AC
  Administered 2018-07-21: 1000 mL via INTRAVENOUS

## 2018-07-21 MED ORDER — PEG 3350-KCL-NA BICARB-NACL 420 G PO SOLR
4000.0000 mL | Freq: Once | ORAL | Status: AC
  Administered 2018-07-21: 4000 mL via ORAL

## 2018-07-21 NOTE — Progress Notes (Signed)
[name] PROGRESS NOTE    Progress Note  Jeffery Roman  JAS:[id number] DOB: 08/28/1966 DOA: 07/18/2018 PCP: Alroy Dust, L.Marlou Sa, MD     Brief Narrative:   Jeffery Roman is an 52 y.o. male past medical history of hypertension comes in complaining of abdominal pain, she recently seen her PCP and was started on.  Antibiotics no improvement came into the ER was found to be in acute renal failure with mild pancreatitis calcium of 12.6 bilirubin of 1, CT scan of the abdomen pelvis showed pancreatitis with a colonic mass with multiple metastatic lesions to the liver.  Assessment/Plan:   Acute pancreatitis: Continue IV fluid strict I's and O's Daily weights. Continue narcotics for pain place her n.p.o. We will go ahead and do an MRCP contrast.  Acute kidney injury: Continues to improve with IV hydration, unknown baseline creatinine.  Hyperkalemia: No changes on EKG his potassium is improving slowly.  Colonic neoplasm with metastatic liver disease: Hemoglobin is 12.4, we will consult for possible colonoscopy Consult GI, did not finish his prep we have notified GI, KUB was obtained.  Hypercalcemia Likely due to dehydration is improving with IV fluids.  Essential hypertension Hold antihypertensive medication.   DVT prophylaxis: SCD Family Communication:None Disposition Plan/Barrier to D/C: unable to determine Code Status:     Code Status Orders  (From admission, onward)         Start     Ordered   07/19/18 0316  Full code  Continuous     07/19/18 0317        Code Status History    This patient has a current code status but no historical code status.        IV Access:    Peripheral IV   Procedures and diagnostic studies:   Dg Abd 1 View - Kub  Result Date: 07/20/2018 CLINICAL DATA:  Evaluate for free peritoneal air. EXAM: ABDOMEN - 1 VIEW COMPARISON:  CT 07/18/2018 FINDINGS: Moderate air and contrast throughout the colon. No definite free peritoneal  air. Remainder the exam is unremarkable. IMPRESSION: Nonspecific, nonobstructive bowel gas pattern. No definite free peritoneal air. Recommend upright KUB with diaphragms in field of view versus right lateral decubitus film for better evaluation for free air. Electronically Signed   By: Marin Olp M.D.   On: 07/20/2018 11:03     Medical Consultants:    None.  Anti-Infectives:   None  Subjective:    Jeffery Roman relates he had a watery bowel movement today. No further abdominal pain. Objective:    Vitals:   07/20/18 1256 07/20/18 2057 07/20/18 2147 07/21/18 0551  BP: (!) 147/94  (!) 162/91 (!) 160/86  Pulse: (!) 108 (!) 112 (!) 113 (!) 120  Resp: (!) 29 20  20   Temp: 99.9 F (37.7 C) 99.8 F (37.7 C)  100.3 F (37.9 C)  TempSrc: Oral Oral  Oral  SpO2: 94% 95%  91%  Weight:      Height:        Intake/Output Summary (Last 24 hours) at 07/21/2018 1013 Last data filed at 07/21/2018 0600 Gross per 24 hour  Intake 2446.34 ml  Output 950 ml  Net 1496.34 ml   Filed Weights   07/18/18 2057 07/19/18 0237 07/20/18 0522  Weight: 89.4 kg 88.6 kg 91.5 kg    Exam: General exam: In no acute distress. Respiratory system: Good air movement and clear to auscultation. Cardiovascular system: S1 & S2 heard, RRR. Gastrointestinal system: Abdomen is nondistended, soft and  nontender.  Central nervous system: Alert and oriented. No focal neurological deficits. Extremities: No pedal edema. Skin: No rashes, lesions or ulcers Psychiatry: Judgement and insight appear normal. Mood & affect appropriate.    Data Reviewed:    Labs: Basic Metabolic Panel: Recent Labs  Lab 07/18/18 2114 07/19/18 0543 07/19/18 1449 07/20/18 1219  NA 128* 129* 130* 132*  K 4.7 6.0* 5.6* 5.8*  CL 94* 99 102 104  CO2 23 21* 22 21*  GLUCOSE 115* 96 89 71  BUN 17 17 17 16   CREATININE 1.75* 1.63* 1.54* 1.36*  CALCIUM 12.6* 11.5* 10.6* 10.1  MG  --  1.9  --   --    GFR Estimated Creatinine  Clearance: 76 mL/min (A) (by C-G formula based on SCr of 1.36 mg/dL (H)). Liver Function Tests: Recent Labs  Lab 07/18/18 2114 07/19/18 0543 07/20/18 1219  AST 58* 59* 69*  ALT 45* 41 36  ALKPHOS 335* 283* 262*  BILITOT 1.0 1.1 2.4*  PROT 8.0 7.2 6.0*  ALBUMIN 3.3* 2.9* 2.5*   Recent Labs  Lab 07/18/18 2114 07/20/18 1219  LIPASE 1,104* 214*   No results for input(s): AMMONIA in the last 168 hours. Coagulation profile No results for input(s): INR, PROTIME in the last 168 hours.  CBC: Recent Labs  Lab 07/18/18 2114 07/19/18 0543 07/20/18 1219  WBC 14.1* 16.3* 15.6*  NEUTROABS  --  13.7*  --   HGB 13.3 12.4* 11.0*  HCT 41.0 38.7* 34.6*  MCV 84.7 86.4 87.8  PLT 409* 359 291   Cardiac Enzymes: Recent Labs  Lab 07/19/18 0543  TROPONINI <0.03   BNP (last 3 results) No results for input(s): PROBNP in the last 8760 hours. CBG: No results for input(s): GLUCAP in the last 168 hours. D-Dimer: No results for input(s): DDIMER in the last 72 hours. Hgb A1c: No results for input(s): HGBA1C in the last 72 hours. Lipid Profile: Recent Labs    07/19/18 0543  TRIG 43   Thyroid function studies: No results for input(s): TSH, T4TOTAL, T3FREE, THYROIDAB in the last 72 hours.  Invalid input(s): FREET3 Anemia work up: No results for input(s): VITAMINB12, FOLATE, FERRITIN, TIBC, IRON, RETICCTPCT in the last 72 hours. Sepsis Labs: Recent Labs  Lab 07/18/18 2114 07/19/18 0543 07/20/18 1219  WBC 14.1* 16.3* 15.6*   Microbiology No results found for this or any previous visit (from the past 240 hour(s)).   Medications:   . feeding supplement (ENSURE ENLIVE)  237 mL Oral BID BM  . multivitamin with minerals  1 tablet Oral Daily   Continuous Infusions: . sodium chloride 100 mL/hr at 07/21/18 0600     LOS: 2 days   [facility] Pager [phone]  *Please refer to [email or web address], password [email or web address] to get updated schedule on who will round on this  patient, as hospitalists switch teams weekly. If 7PM-7AM, please contact night-coverage at [email or web address], password [email or web address] for any overnight needs.  07/21/2018, 10:13 AM

## 2018-07-21 NOTE — H&P (View-Only) (Signed)
[facility] Progress Note  Jeffery Roman 52 y.o. 01/08/1966   Subjective: Passed a small loose BM early this morning. Denies bleeding. Less distention this morning. Denies N/V.  Objective: Vital signs: Vitals:   07/20/18 2147 07/21/18 0551  BP: (!) 162/91 (!) 160/86  Pulse: (!) 113 (!) 120  Resp:  20  Temp:  100.3 F (37.9 C)  SpO2:  91%    Physical Exam: Gen: lethargic, no acute distress, well-nourished HEENT: anicteric sclera CV: RRR Chest: CTA B Abd: epigastric and RUQ tenderness with guarding, soft, nondistended, +BS Ext: no edema  Lab Results: Recent Labs    07/19/18 0543 07/19/18 1449 07/20/18 1219  NA 129* 130* 132*  K 6.0* 5.6* 5.8*  CL 99 102 104  CO2 21* 22 21*  GLUCOSE 96 89 71  BUN 17 17 16   CREATININE 1.63* 1.54* 1.36*  CALCIUM 11.5* 10.6* 10.1  MG 1.9  --   --    Recent Labs    07/19/18 0543 07/20/18 1219  AST 59* 69*  ALT 41 36  ALKPHOS 283* 262*  BILITOT 1.1 2.4*  PROT 7.2 6.0*  ALBUMIN 2.9* 2.5*   Recent Labs    07/19/18 0543 07/20/18 1219  WBC 16.3* 15.6*  NEUTROABS 13.7*  --   HGB 12.4* 11.0*  HCT 38.7* 34.6*  MCV 86.4 87.8  PLT 359 291      Assessment/Plan: Colon mass that is likely partially obstructing but good sign with passage of stool and contrast distal to mass. MRCP ordered by surgery to further assess pancreatitis and mets. Will give additional prep today and consider colonoscopy for tissue diagnosis tomorrow if adequate BMs ocur otherwise will do 07/23/18. NPO. Supportive care.    Jeffery Roman 07/21/2018, 11:57 AM  Questions please call [phone] ID: Jeffery Roman, male   DOB: 1966/04/16, 52 y.o.   MRN: [id number]

## 2018-07-21 NOTE — Progress Notes (Signed)
Jeffery Roman [id number] 04-20-1966  CARE TEAM:  PCP: Alroy Dust, L.Marlou Sa, MD  Outpatient Care Team: Patient Care Team: [place], Carlean Jews.Marlou Sa, MD as PCP - General (Family Medicine)  Inpatient Treatment Team: Treatment Team: Attending Provider: Aileen Fass, Tammi Klippel, MD; Rounding Team: Joycelyn Das, MD; Technician: Lucila Maine, NT; Consulting Physician: Wilford Corner, MD; Technician: Loree Fee, NT; Registered Nurse: Claris Pong, RN; Technician: Angie Fava, NT; Registered Nurse: Ophelia Shoulder, RN; Consulting Physician: Nolon Nations, MD; Registered Nurse: Eli Hose, RN; Registered Nurse: Kirby Crigler, RN   Problem List:   Principal Problem:   Acute pancreatitis Active Problems:   Hypercalcemia   Colon neoplasm   ARF (acute renal failure) [facility])   Essential hypertension      07/20/2018  Procedure(s): [facility] Stay = 2 days  Assessment  Ascending colon mass with liver mets and lymphadenopathy.  Elevated CEA=  highly suspicious for ascending colon cancer metastatic to liver.  Concern for obstruction but no definite total obstruction at this time.  Pancreatitis of uncertain etiology.  Suspect extrinsic compression from lymphadenopathy and not true cholecystitis/choledocholithiasis/gallstone pancreatitis  Plan:  -Discussion with the patient, RN,  and primary service Dr. Olevia Bowens.  Think the patient would benefit from an MRI/MRCP.  This can help evaluate the pancreas and liver masses.  Also evaluate the biliary system to see if patient has extrinsic compression from lymphadenopathy versus true choledocholithiasis.  U/S redundant.  Dr Olevia Bowens agrees - will order  While he has had some obstructive like symptoms, he is not obstructed.  He is passing gas and had a bowel movement.  Contrast in colon past the ascending colon mass.  See if we can try and more gently clean him out and reconsider colonoscopy for biopsy.  Otherwise consider  liver biopsy.  If cancer proven, patient would do better with palliative chemotherapy to control disease.  Not ideal to do palliative right colectomy with ileostomy at this time unless he is truly obstructed and all other options have failed.  Also with pancreatitis, operative risks of bleeding and other issues are markedly increased.  Would like to hold off on that if possible.  Patient and primary service agree.  -ARF - resolving -VTE prophylaxis- SCDs, etc -mobilize as tolerated to help recovery  30 minutes spent in review, evaluation, examination, counseling, and coordination of care.  More than 50% of that time was spent in counseling.  07/21/2018    Subjective: (Chief complaint)  Patient feeling better.  No more abdominal cramping.  Passing gas.  Had bowel movement this morning.  Not nauseated.   Nurse outside room.  Primary service just outside hallway  Objective:  Vital signs:  Vitals:   07/20/18 1256 07/20/18 2057 07/20/18 2147 07/21/18 0551  BP: (!) 147/94  (!) 162/91 (!) 160/86  Pulse: (!) 108 (!) 112 (!) 113 (!) 120  Resp: (!) '29 20  20  ' Temp: 99.9 F (37.7 C) 99.8 F (37.7 C)  100.3 F (37.9 C)  TempSrc: Oral Oral  Oral  SpO2: 94% 95%  91%  Weight:      Height:        Last BM Date: 07/19/18  Intake/Output   Yesterday:  10/25 0701 - 10/26 0700 In: 2446.3 [I.V.:2446.3] Out: 950 [Urine:950] This shift:  No intake/output data recorded.  Bowel function:  Flatus: YES  BM:  YES  Drain: (No drain)   Physical Exam:  General: Pt awake/alert/oriented x4 in no acute distress Eyes:  PERRL, normal EOM.  Sclera clear.  No icterus Neuro: CN II-XII intact w/o focal sensory/motor deficits. Lymph: No head/neck/groin lymphadenopathy Psych:  No delerium/psychosis/paranoia HENT: Normocephalic, Mucus membranes moist.  No thrush Neck: Supple, No tracheal deviation Chest: No chest wall pain w good excursion CV:  Pulses intact.  Regular rhythm MS: Normal AROM mjr  joints.  No obvious deformity  Abdomen: Somewhat firm.  Mildy distended.  Tenderness at epigastric area - mild.  No evidence of peritonitis.  No incarcerated hernias.  Ext:  No deformity.  No mjr edema.  No cyanosis Skin: No petechiae / purpura  Results:   Labs: Results for orders placed or performed during the hospital encounter of 07/18/18 (from the past 48 hour(s))  Basic metabolic panel     Status: Abnormal   Collection Time: 07/19/18  2:49 PM  Result Value Ref Range   Sodium 130 (L) 135 - 145 mmol/L   Potassium 5.6 (H) 3.5 - 5.1 mmol/L   Chloride 102 98 - 111 mmol/L   CO2 22 22 - 32 mmol/L   Glucose, Bld 89 70 - 99 mg/dL   BUN 17 6 - 20 mg/dL   Creatinine, Ser 1.54 (H) 0.61 - 1.24 mg/dL   Calcium 10.6 (H) 8.9 - 10.3 mg/dL   GFR calc non Af Amer 51 (L) >60 mL/min   GFR calc Af Amer 60 (L) >60 mL/min    Comment: (NOTE) The eGFR has been calculated using the CKD EPI equation. This calculation has not been validated in all clinical situations. eGFR's persistently <60 mL/min signify possible Chronic Kidney Disease.    Anion gap 6 5 - 15    Comment: Performed at [facility], [place] [address] [place], [place] [address]  CBC     Status: Abnormal   Collection Time: 07/20/18 12:19 PM  Result Value Ref Range   WBC 15.6 (H) 4.0 - 10.5 K/uL   RBC 3.94 (L) 4.22 - 5.81 MIL/uL   Hemoglobin 11.0 (L) 13.0 - 17.0 g/dL   HCT 34.6 (L) 39.0 - 52.0 %   MCV 87.8 80.0 - 100.0 fL   MCH 27.9 26.0 - 34.0 pg   MCHC 31.8 30.0 - 36.0 g/dL   RDW 13.2 11.5 - 15.5 %   Platelets 291 150 - 400 K/uL   nRBC 0.0 0.0 - 0.2 %    Comment: Performed at [facility], [place] [address], [place], [place] [address]  Lipase, blood     Status: Abnormal   Collection Time: 07/20/18 12:19 PM  Result Value Ref Range   Lipase 214 (H) 11 - 51 U/L    Comment: Performed at [facility], [place] [address], [place], [place] [address]  Comprehensive metabolic panel      Status: Abnormal   Collection Time: 07/20/18 12:19 PM  Result Value Ref Range   Sodium 132 (L) 135 - 145 mmol/L   Potassium 5.8 (H) 3.5 - 5.1 mmol/L   Chloride 104 98 - 111 mmol/L   CO2 21 (L) 22 - 32 mmol/L   Glucose, Bld 71 70 - 99 mg/dL   BUN 16 6 - 20 mg/dL   Creatinine, Ser 1.36 (H) 0.61 - 1.24 mg/dL   Calcium 10.1 8.9 - 10.3 mg/dL   Total Protein 6.0 (L) 6.5 - 8.1 g/dL   Albumin 2.5 (L) 3.5 - 5.0 g/dL   AST 69 (H) 15 - 41 U/L   ALT 36 0 - 44 U/L   Alkaline Phosphatase  262 (H) 38 - 126 U/L   Total Bilirubin 2.4 (H) 0.3 - 1.2 mg/dL   GFR calc non Af Amer 60 (L) >60 mL/min   GFR calc Af Amer >60 >60 mL/min    Comment: (NOTE) The eGFR has been calculated using the CKD EPI equation. This calculation has not been validated in all clinical situations. eGFR's persistently <60 mL/min signify possible Chronic Kidney Disease.    Anion gap 7 5 - 15    Comment: Performed at [facility], [place] [address] [place], [place] [address]  CEA     Status: Abnormal   Collection Time: 07/20/18  3:56 PM  Result Value Ref Range   CEA 33.7 (H) 0.0 - 4.7 ng/mL    Comment: (NOTE)                             Nonsmokers          <3.9                             Smokers             <5.6 [employer] Electrochemiluminescence Immunoassay (ECLIA) Values obtained with different assay methods or kits cannot be used interchangeably.  Results cannot be interpreted as absolute evidence of the presence or absence of malignant disease. Performed At: [facility] [place], [place] [id number] Rush Farmer MD WO:[id number]     Imaging / Studies: Dg Abd 1 View - Kub  Result Date: 07/20/2018 CLINICAL DATA:  Evaluate for free peritoneal air. EXAM: ABDOMEN - 1 VIEW COMPARISON:  CT 07/18/2018 FINDINGS: Moderate air and contrast throughout the colon. No definite free peritoneal air. Remainder the exam is unremarkable. IMPRESSION: Nonspecific, nonobstructive bowel  gas pattern. No definite free peritoneal air. Recommend upright KUB with diaphragms in field of view versus right lateral decubitus film for better evaluation for free air. Electronically Signed   By: Marin Olp M.D.   On: 07/20/2018 11:03    Medications / Allergies: per chart  Antibiotics: Anti-infectives (From admission, onward)   None        Note: Portions of this report may have been transcribed using voice recognition software. Every effort was made to ensure accuracy; however, inadvertent computerized transcription errors may be present.   Any transcriptional errors that result from this process are unintentional.     Adin Hector, MD, FACS, MASCRS Gastrointestinal and Minimally Invasive Surgery    [address], [place], [place] [address] [phone] Main / Paging [phone] Fax

## 2018-07-21 NOTE — Anesthesia Preprocedure Evaluation (Addendum)
Anesthesia Evaluation  Patient identified by MRN, date of birth, ID band Patient awake    Reviewed: Allergy & Precautions, NPO status , Patient's Chart, lab work & pertinent test results  Airway Mallampati: I  TM Distance: >3 FB Neck ROM: Full    Dental no notable dental hx. (+) Teeth Intact, Dental Advisory Given   Pulmonary neg pulmonary ROS,    Pulmonary exam normal breath sounds clear to auscultation       Cardiovascular hypertension, Normal cardiovascular exam Rhythm:Regular Rate:Normal     Neuro/Psych negative neurological ROS  negative psych ROS   GI/Hepatic negative GI ROS, Neg liver ROS,   Endo/Other  negative endocrine ROS  Renal/GU ARFRenal disease  negative genitourinary   Musculoskeletal negative musculoskeletal ROS (+)   Abdominal   Peds  Hematology negative hematology ROS (+)   Anesthesia Other Findings Colon cancer c/b ARF and pancreatitis, K 4.3 today  Reproductive/Obstetrics                          Anesthesia Physical Anesthesia Plan  ASA: III  Anesthesia Plan: MAC   Post-op Pain Management:    Induction: Intravenous  PONV Risk Score and Plan: 1 and Treatment may vary due to age or medical condition and Propofol infusion  Airway Management Planned: Natural Airway and Simple Face Mask  Additional Equipment:   Intra-op Plan:   Post-operative Plan:   Informed Consent: I have reviewed the patients History and Physical, chart, labs and discussed the procedure including the risks, benefits and alternatives for the proposed anesthesia with the patient or authorized representative who has indicated his/her understanding and acceptance.   Dental advisory given  Plan Discussed with: CRNA  Anesthesia Plan Comments:         Anesthesia Quick Evaluation

## 2018-07-21 NOTE — Progress Notes (Signed)
[facility] Gastroenterology Progress Note  PROMISE BUSHONG 52 y.o. [birth year]   Subjective: Passed a small loose BM early this morning. Denies bleeding. Less distention this morning. Denies N/V.  Objective: Vital signs: Vitals:   07/20/18 2147 07/21/18 0551  BP: (!) 162/91 (!) 160/86  Pulse: (!) 113 (!) 120  Resp:  20  Temp:  100.3 F (37.9 C)  SpO2:  91%    Physical Exam: Gen: lethargic, no acute distress, well-nourished HEENT: anicteric sclera CV: RRR Chest: CTA B Abd: epigastric and RUQ tenderness with guarding, soft, nondistended, +BS Ext: no edema  Lab Results: Recent Labs    07/19/18 0543 07/19/18 1449 07/20/18 1219  NA 129* 130* 132*  K 6.0* 5.6* 5.8*  CL 99 102 104  CO2 21* 22 21*  GLUCOSE 96 89 71  BUN 17 17 16   CREATININE 1.63* 1.54* 1.36*  CALCIUM 11.5* 10.6* 10.1  MG 1.9  --   --    Recent Labs    07/19/18 0543 07/20/18 1219  AST 59* 69*  ALT 41 36  ALKPHOS 283* 262*  BILITOT 1.1 2.4*  PROT 7.2 6.0*  ALBUMIN 2.9* 2.5*   Recent Labs    07/19/18 0543 07/20/18 1219  WBC 16.3* 15.6*  NEUTROABS 13.7*  --   HGB 12.4* 11.0*  HCT 38.7* 34.6*  MCV 86.4 87.8  PLT 359 291      Assessment/Plan: Colon mass that is likely partially obstructing but good sign with passage of stool and contrast distal to mass. MRCP ordered by surgery to further assess pancreatitis and mets. Will give additional prep today and consider colonoscopy for tissue diagnosis tomorrow if adequate BMs ocur otherwise will do 07/23/18. NPO. Supportive care.    [name] 07/21/2018, 11:57 AM  Questions please call [phone] ID: [name], male   DOB: 1965-11-09, 52 y.o.   MRN: [id number]

## 2018-07-22 ENCOUNTER — Encounter (HOSPITAL_COMMUNITY)
Admission: EM | Disposition: A | Discharge status: Home / Self Care | Payer: Self-pay | Source: Home / Self Care | Attending: Internal Medicine

## 2018-07-22 ENCOUNTER — Inpatient Hospital Stay (HOSPITAL_COMMUNITY): Payer: BLUE CROSS/BLUE SHIELD | Admitting: Certified Registered Nurse Anesthetist

## 2018-07-22 ENCOUNTER — Encounter (HOSPITAL_COMMUNITY): Payer: Self-pay

## 2018-07-22 HISTORY — PX: COLONOSCOPY WITH PROPOFOL: SHX5780

## 2018-07-22 HISTORY — PX: SUBMUCOSAL INJECTION: SHX5543

## 2018-07-22 HISTORY — PX: BIOPSY: SHX5522

## 2018-07-22 LAB — POCT I-STAT EG7
Bicarbonate: 23.8 mmol/L (ref 20.0–28.0)
Calcium, Ion: 1.44 mmol/L — ABNORMAL HIGH (ref 1.15–1.40)
HCT: 32 % — ABNORMAL LOW (ref 39.0–52.0)
HEMOGLOBIN: 10.9 g/dL — AB (ref 13.0–17.0)
O2 Saturation: 73 %
PO2 VEN: 36 mmHg (ref 32.0–45.0)
Potassium: 4.3 mmol/L (ref 3.5–5.1)
SODIUM: 131 mmol/L — AB (ref 135–145)
TCO2: 25 mmol/L (ref 22–32)
pCO2, Ven: 33.7 mmHg — ABNORMAL LOW (ref 44.0–60.0)
pH, Ven: 7.457 — ABNORMAL HIGH (ref 7.250–7.430)

## 2018-07-22 LAB — BASIC METABOLIC PANEL
Anion gap: 9 (ref 5–15)
BUN: 17 mg/dL (ref 6–20)
CO2: 23 mmol/L (ref 22–32)
Calcium: 9.9 mg/dL (ref 8.9–10.3)
Chloride: 100 mmol/L (ref 98–111)
Creatinine, Ser: 1.16 mg/dL (ref 0.61–1.24)
GFR calc Af Amer: >60 mL/min (ref >60)
GFR calc non Af Amer: >60 mL/min (ref >60)
Glucose, Bld: 92 mg/dL (ref 70–99)
POTASSIUM: 4.6 mmol/L (ref 3.5–5.1)
SODIUM: 132 mmol/L — AB (ref 135–145)

## 2018-07-22 SURGERY — COLONOSCOPY WITH PROPOFOL
Anesthesia: Monitor Anesthesia Care

## 2018-07-22 MED ORDER — DEXAMETHASONE SODIUM PHOSPHATE 10 MG/ML IJ SOLN
INTRAMUSCULAR | Status: DC | PRN
  Administered 2018-07-22: 10 mg via INTRAVENOUS

## 2018-07-22 MED ORDER — SODIUM CHLORIDE 0.9 % IV SOLN
INTRAVENOUS | Status: AC
  Administered 2018-07-22 – 2018-07-23 (×3): via INTRAVENOUS

## 2018-07-22 MED ORDER — PROPOFOL 500 MG/50ML IV EMUL
INTRAVENOUS | Status: DC | PRN
  Administered 2018-07-22: 125 ug/kg/min via INTRAVENOUS

## 2018-07-22 MED ORDER — MORPHINE SULFATE (PF) 4 MG/ML IV SOLN
4.0000 mg | INTRAVENOUS | Status: DC | PRN
  Administered 2018-07-22 – 2018-07-24 (×5): 4 mg via INTRAVENOUS
  Filled 2018-07-22 (×5): qty 1

## 2018-07-22 MED ORDER — LACTATED RINGERS IV SOLN
INTRAVENOUS | Status: DC
  Administered 2018-07-22 (×2): via INTRAVENOUS

## 2018-07-22 MED ORDER — PROPOFOL 10 MG/ML IV BOLUS
INTRAVENOUS | Status: DC | PRN
  Administered 2018-07-22 (×4): 20 mg via INTRAVENOUS
  Administered 2018-07-22: 40 mg via INTRAVENOUS

## 2018-07-22 MED ORDER — SPOT INK MARKER SYRINGE KIT
PACK | SUBMUCOSAL | Status: DC | PRN
  Administered 2018-07-22: 5 mL via SUBMUCOSAL

## 2018-07-22 MED ORDER — PROPOFOL 10 MG/ML IV BOLUS
INTRAVENOUS | Status: AC
  Filled 2018-07-22: qty 60

## 2018-07-22 MED ORDER — SPOT INK MARKER SYRINGE KIT
PACK | SUBMUCOSAL | Status: AC
  Filled 2018-07-22: qty 10

## 2018-07-22 MED ORDER — LIDOCAINE 2% (20 MG/ML) 5 ML SYRINGE
INTRAMUSCULAR | Status: DC | PRN
  Administered 2018-07-22: 60 mg via INTRAVENOUS

## 2018-07-22 SURGICAL SUPPLY — 22 items

## 2018-07-22 NOTE — Brief Op Note (Addendum)
Partially obstructing ascending colon mass biopsied and tattooed on colonoscopy. Able to reach cecum without difficulty. See endopro procedure report for complete details/recs. NPO except ice chips and sips with meds. Needs inpt oncology consult. Path pending.

## 2018-07-22 NOTE — Interval H&P Note (Signed)
History and Physical Interval Note:  07/22/2018 7:37 AM  Jeffery Roman  has presented today for surgery, with the diagnosis of Colon Mass; Abdominal pain  The various methods of treatment have been discussed with the patient and family. After consideration of risks, benefits and other options for treatment, the patient has consented to  Procedure(s): COLONOSCOPY WITH PROPOFOL (N/A) as a surgical intervention .  The patient's history has been reviewed, patient examined, no change in status, stable for surgery.  I have reviewed the patient's chart and labs.  Questions were answered to the patient's satisfaction.     [name]

## 2018-07-22 NOTE — Progress Notes (Signed)
Note: Portions of this report may have been transcribed using voice recognition software. Every effort was made to ensure accuracy; however, inadvertent computerized transcription errors may be present.   Any transcriptional errors that result from this process are unintentional.              Jeffery Roman  1966-07-11 [id number]  Patient Care Team: Alroy Dust, L.Marlou Sa, MD as PCP - General (Family Medicine)  This patient is a 52 y.o.male who presents today for surgical evaluation   Patient underwent colonoscopy today.  Found to have bulky mass in ascending colon.  No obstruction.  Suspicious for cancer.  Biopsied and tattooed.  Dr. Michail Sermon called and discussed with me.  MRCP pending given liver masses and questionable pancreatitis.  I am skeptical of cholecystitis.  More strongly suspect extrinsic compression from lymphadenopathy related to the metastatic colorectal cancer.  Would not be an ideal situation to do cholecystectomy with all these liver metastases anyway.   As mentioned in yesterday's note, most likely patient has metastatic colorectal cancer and would benefit from palliative chemotherapy and medical oncologic consultation.  Usually do not do surgery first in this situation unless requires palliative resection such as in the setting of uncontrolled bleeding, perforation, or obstruction.  No definite evidence of this at this time.  Surgery will follow peripherally for now.  No urgent surgical needs at this time.   Adin Hector, MD, FACS, MASCRS Gastrointestinal and Minimally Invasive Surgery    [address] [place],  [address] [phone] Main / Paging [phone] Fax    Patient Active Problem List   Diagnosis Date Noted  . 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 Colon obstruction - partial - from ascending colon tumor 07/21/2018  . Acute pancreatitis 07/19/2018  . Hypercalcemia 07/19/2018  . Mass of ascending  colon - probable cancer 07/19/2018  . ARF (acute renal failure) ([place]) 07/19/2018  . Essential hypertension 07/19/2018    Past Medical History:  Diagnosis Date  . Hypertension     History reviewed. No pertinent surgical history.  Social History   Socioeconomic History  . Marital status: Married    Spouse name: Not on file  . Number of children: Not on file  . Years of education: Not on file  . Highest education level: Not on file  Occupational History  . Not on file  Social Needs  . Financial resource strain: Not on file  . Food insecurity:    Worry: Not on file    Inability: Not on file  . Transportation needs:    Medical: Not on file    Non-medical: Not on file  Tobacco Use  . Smoking status: Never Smoker  . Smokeless tobacco: Never Used  Substance and Sexual Activity  . Alcohol use: Not on file  . Drug use: Not on file  . Sexual activity: Not on file  Lifestyle  . Physical activity:    Days per week: Not on file    Minutes per session: Not on file  . Stress: Not on file  Relationships  . Social connections:    Talks on phone: Not on file    Gets together: Not on file    Attends religious service: Not on file    Active member of club or organization: Not on file    Attends meetings of clubs or organizations: Not on file    Relationship status: Not on file  . Intimate partner violence:  Fear of current or ex partner: Not on file    Emotionally abused: Not on file    Physically abused: Not on file    Forced sexual activity: Not on file  Other Topics Concern  . Not on file  Social History Narrative  . Not on file    Family History  Problem Relation Age of Onset  . Diabetes Mellitus II Mother   . Prostate cancer Father   . Colon cancer Maternal Grandmother     Current Facility-Administered Medications  Medication Dose Route Frequency Provider Last Rate Last Dose  . 0.9 %  sodium chloride infusion   Intravenous Continuous Wilford Corner, MD 10  mL/hr at 07/21/18 1530    . 0.9 %  sodium chloride infusion   Intravenous Continuous Charlynne Cousins, MD 100 mL/hr at 07/22/18 0951    . acetaminophen (TYLENOL) tablet 650 mg  650 mg Oral Q6H PRN [name], MD   650 mg at 07/21/18 2203   Or  . acetaminophen (TYLENOL) suppository 650 mg  650 mg Rectal Q6H PRN [name], MD      . bisacodyl (DULCOLAX) suppository 10 mg  10 mg Rectal Daily Michael Boston, MD   10 mg at 07/21/18 1217  . feeding supplement (ENSURE ENLIVE) (ENSURE ENLIVE) liquid 237 mL  237 mL Oral BID BM Charlynne Cousins, MD   237 mL at 07/21/18 0809  . fentaNYL (SUBLIMAZE) injection 50 mcg  50 mcg Intravenous Q2H PRN [name], MD   50 mcg at 07/21/18 1430  . hydrALAZINE (APRESOLINE) injection 10 mg  10 mg Intravenous Q4H PRN [name], MD   10 mg at 07/21/18 2203  . morphine 4 MG/ML injection 4 mg  4 mg Intravenous Q3H PRN Charlynne Cousins, MD   4 mg at 07/22/18 0948  . multivitamin with minerals tablet 1 tablet  1 tablet Oral Daily Charlynne Cousins, MD   1 tablet at 07/21/18 0809  . ondansetron (ZOFRAN) tablet 4 mg  4 mg Oral Q6H PRN [name], MD       Or  . ondansetron [facility]) injection 4 mg  4 mg Intravenous Q6H PRN [name], MD   4 mg at 07/22/18 0752  . polyethylene glycol (MIRALAX / GLYCOLAX) packet 34 g  34 g Oral BID Michael Boston, MD   34 g at 07/21/18 2203  . zolpidem (AMBIEN) tablet 5 mg  5 mg Oral QHS PRN Charlynne Cousins, MD   5 mg at 07/20/18 2203     Allergies  Allergen Reactions  . Ciprofloxacin Other (See Comments)    Too strong   . Lisinopril Other (See Comments)    ED    BP (!) 142/88 (BP Location: Left Arm)   Pulse 99   Temp 98.6 F (37 C) (Oral)   Resp 20   Ht 5' 10.98" (1.803 m)   Wt 91.5 kg   SpO2 95%   BMI 28.14 kg/m   Dg Abd 1 View - Kub  Result Date: 07/20/2018 CLINICAL DATA:  Evaluate for free peritoneal air. EXAM: ABDOMEN - 1 VIEW COMPARISON:  CT  07/18/2018 FINDINGS: Moderate air and contrast throughout the colon. No definite free peritoneal air. Remainder the exam is unremarkable. IMPRESSION: Nonspecific, nonobstructive bowel gas pattern. No definite free peritoneal air. Recommend upright KUB with diaphragms in field of view versus right lateral decubitus film for better evaluation for free air. Electronically Signed   By: Quillian Quince  Derrel Nip M.D.   On: 07/20/2018 11:03   Ct Abdomen Pelvis W Contrast  Result Date: 07/18/2018 CLINICAL DATA:  Worsening epigastric pain for a week, vomiting today. On treatment for prostatitis. EXAM: CT ABDOMEN AND PELVIS WITH CONTRAST TECHNIQUE: Multidetector CT imaging of the abdomen and pelvis was performed using the standard protocol following bolus administration of intravenous contrast. CONTRAST:  142mL ISOVUE-300 IOPAMIDOL (ISOVUE-300) INJECTION 61% COMPARISON:  CT pelvis December 08, 2014 FINDINGS: LOWER CHEST: No pericardial effusions. Lower lobe atelectasis. Heart size is normal HEPATOBILIARY: Numerous rounded hypodensities throughout the liver measuring to 3.7 cm. Liver size is normal. Normal gallbladder. PANCREAS: Peripancreatic inflammation without parenchymal hypodensity, ductal dilatation, abnormal calcifications or mass. SPLEEN: Normal. ADRENALS/URINARY TRACT: Kidneys are orthotopic, demonstrating symmetric enhancement. No nephrolithiasis, hydronephrosis or solid renal masses. The unopacified ureters are normal in course and caliber. Delayed imaging through the kidneys demonstrates symmetric prompt contrast excretion within the proximal urinary collecting system. Urinary bladder is partially distended and unremarkable. Normal adrenal glands. STOMACH/BOWEL: Eccentric posterior thickening of the ascending colon (axial image 61). The stomach, small bowel are normal in course and caliber without inflammatory changes. Normal appendix. VASCULAR/LYMPHATIC: Aortoiliac vessels are normal in course and caliber. No  lymphadenopathy by CT size criteria. REPRODUCTIVE: Normal. OTHER: No intraperitoneal free fluid or free air. Small amount of ascites predominantly in the LEFT upper quadrant and LEFT pericolic gutter. No intraperitoneal free air or focal fluid collection. MUSCULOSKELETAL: Nonacute. Severe L5-S1 disc height loss with endplate sclerosis and vacuum disc compatible with degenerative disc resulting in severe LEFT L5-S1 neural foraminal narrowing. IMPRESSION: 1. Acute pancreatitis without necrosis. 2. Eccentric ascending colon wall thickening concerning for primary neoplasm. Multiple hepatic metastasis. Recommend colonoscopy and/or PET-CT on non emergent basis. 3. Acute findings discussed with and reconfirmed by Dr.MOLPUS on 07/18/2018 at 11:42 pm. Electronically Signed   By: Elon Alas M.D.   On: 07/18/2018 23:45   Adin Hector, MD, FACS, MASCRS Gastrointestinal and Minimally Invasive Surgery    [address] [place], [place] [address] [phone] Main / Paging [phone] Fax

## 2018-07-22 NOTE — Anesthesia Postprocedure Evaluation (Signed)
Anesthesia Post Note  Patient: Jeffery Roman  Procedure(s) Performed: COLONOSCOPY WITH PROPOFOL (N/A )     Patient location during evaluation: Endoscopy Anesthesia Type: MAC Level of consciousness: awake and alert Pain management: pain level controlled Vital Signs Assessment: post-procedure vital signs reviewed and stable Respiratory status: spontaneous breathing, nonlabored ventilation, respiratory function stable and patient connected to nasal cannula oxygen Cardiovascular status: blood pressure returned to baseline and stable Postop Assessment: no apparent nausea or vomiting Anesthetic complications: no    Last Vitals:  Vitals:   07/22/18 0813 07/22/18 0820  BP: 118/65 127/78  Pulse: 92 92  Resp: (!) 29 (!) 28  Temp: 37.2 C   SpO2: 100% 97%    Last Pain:  Vitals:   07/22/18 0813  TempSrc: Oral  PainSc:                  Marquette Piontek L Krishika Bugge

## 2018-07-22 NOTE — Progress Notes (Signed)
Patient was unable to tolerant bowel prep for colonoscopy, he did however drink Miralax 34Gram without any problem. Patient's stools have been tan/Algeo transparent.

## 2018-07-22 NOTE — Transfer of Care (Signed)
Immediate Anesthesia Transfer of Care Note  Patient: Jeffery Roman  Procedure(s) Performed: COLONOSCOPY WITH PROPOFOL (N/A )  Patient Location: Endoscopy Unit  Anesthesia Type:MAC  Level of Consciousness: drowsy  Airway & Oxygen Therapy: Patient Spontanous Breathing and Patient connected to face mask  Post-op Assessment: Report given to RN and Post -op Vital signs reviewed and stable  Post vital signs: Reviewed and stable  Last Vitals:  Vitals Value Taken Time  BP    Temp    Pulse 92 07/22/2018  8:11 AM  Resp 23 07/22/2018  8:11 AM  SpO2 100 % 07/22/2018  8:11 AM  Vitals shown include unvalidated device data.  Last Pain:  Vitals:   07/22/18 0703  TempSrc: Oral  PainSc: 4       Patients Stated Pain Goal: 3 (74/94/49 6759)  Complications: No apparent anesthesia complications

## 2018-07-22 NOTE — Progress Notes (Addendum)
Jeffery Roman, Jeffery Sa, Jeffery     Brief Narrative:   Jeffery Roman is an 52 y.o. male past medical history of hypertension comes in complaining of abdominal Roman, Jeffery recently seen her PCP and was started on.  Antibiotics no improvement came into the ER was found to be in acute renal failure with mild pancreatitis calcium of 12.6 bilirubin of 1, CT scan of the abdomen pelvis showed pancreatitis with a colonic mass with multiple metastatic lesions to the liver.  Assessment/Plan:   Acute pancreatitis: He continues to have abdominal Roman continue IV fluids keep him n.p.o. Zofran for nausea. Use IV narcotics for Roman.   Acute kidney injury: Basic metabolic panel is pending this morning continue IV fluids recheck a basic metabolic panel in the morning  Hyperkalemia: Improved. Now 4.  Colonic neoplasm with metastatic liver disease: Hemoglobin this morning is 10.9 we will continue to trend hemoglobin. GI was consulted who recommended a colonoscopy that showed a partially obstructed mass in the ascending colon. Pathology reports are pending. Check an MRCP. CT scan of the chest once his creatinine is at goal GFR.  Hypercalcemia Improved with IV fluids.  Essential hypertension Hold antihypertensive medication.   DVT prophylaxis: SCD Family Communication:None Disposition Plan/Barrier to D/C: unable to determine Code Status:     Code Status Orders  (From admission, onward)         Start     Ordered   07/19/18 0316  Full code  Continuous     07/19/18 0317        Code Status History    This patient has a current code status but no historical code status.        IV Access:    Peripheral IV   Procedures and diagnostic studies:   Dg Abd 1 View - Kub  Result Date: 07/20/2018 CLINICAL DATA:  Evaluate for free peritoneal air. EXAM: ABDOMEN - 1 VIEW  COMPARISON:  CT 07/18/2018 FINDINGS: Moderate air and contrast throughout the colon. No definite free peritoneal air. Remainder the exam is unremarkable. IMPRESSION: Nonspecific, nonobstructive bowel gas pattern. No definite free peritoneal air. Recommend upright KUB with diaphragms in field of view versus right lateral decubitus film for better evaluation for free air. Electronically Signed   By: Marin Olp M.D.   On: 07/20/2018 11:03     Medical Consultants:    None.  Anti-Infectives:   None  Subjective:    [name]  Complaining of abd Roman with radiation to the back Objective:    Vitals:   07/22/18 0703 07/22/18 0813 07/22/18 0820 07/22/18 0840  BP: (!) 173/96 118/65 127/78 (!) 142/88  Pulse: (!) 109 92 92 99  Resp: (!) 28 (!) 29 (!) 28 20  Temp: 98.4 F (36.9 C) 99 F (37.2 C)  98.6 F (37 C)  TempSrc: Oral Oral  Oral  SpO2: 93% 100% 97% 95%  Weight:      Height:        Intake/Output Summary (Last 24 hours) at 07/22/2018 0918 Last data filed at 07/22/2018 0758 Gross per 24 hour  Intake 685 ml  Output 0 ml  Net 685 ml   Filed Weights   07/18/18 2057 07/19/18 0237 07/20/18 0522  Weight: 89.4 kg 88.6 kg 91.5 kg    Exam: General exam: In no acute distress. Respiratory system: Good air movement and clear to auscultation.  Cardiovascular system: S1 & S2 heard, RRR. Gastrointestinal system: Abdomen is nondistended, soft and nontender.  Central nervous system: Alert and oriented. No focal neurological deficits. Extremities: No pedal edema. Skin: No rashes, lesions or ulcers Psychiatry: Judgement and insight appear normal. Mood & affect appropriate.    Data Reviewed:    Labs: Basic Metabolic Panel: Recent Labs  Lab 07/18/18 2114 07/19/18 0543 07/19/18 1449 07/20/18 1219 07/22/18 0722  NA 128* 129* 130* 132* 131*  K 4.7 6.0* 5.6* 5.8* 4.3  CL 94* 99 102 104  --   CO2 23 21* 22 21*  --   GLUCOSE 115* 96 89 71  --   BUN 17 17 17 16   --     CREATININE 1.75* 1.63* 1.54* 1.36*  --   CALCIUM 12.6* 11.5* 10.6* 10.1  --   MG  --  1.9  --   --   --    GFR Estimated Creatinine Clearance: 76 mL/min (A) (by C-G formula based on SCr of 1.36 mg/dL (H)). Liver Function Tests: Recent Labs  Lab 07/18/18 2114 07/19/18 0543 07/20/18 1219  AST 58* 59* 69*  ALT 45* 41 36  ALKPHOS 335* 283* 262*  BILITOT 1.0 1.1 2.4*  PROT 8.0 7.2 6.0*  ALBUMIN 3.3* 2.9* 2.5*   Recent Labs  Lab 07/18/18 2114 07/20/18 1219  LIPASE 1,104* 214*   No results for input(s): AMMONIA in the last 168 hours. Coagulation profile No results for input(s): INR, PROTIME in the last 168 hours.  CBC: Recent Labs  Lab 07/18/18 2114 07/19/18 0543 07/20/18 1219 07/22/18 0722  WBC 14.1* 16.3* 15.6*  --   NEUTROABS  --  13.7*  --   --   HGB 13.3 12.4* 11.0* 10.9*  HCT 41.0 38.7* 34.6* 32.0*  MCV 84.7 86.4 87.8  --   PLT 409* 359 291  --    Cardiac Enzymes: Recent Labs  Lab 07/19/18 0543  TROPONINI <0.03   BNP (last 3 results) No results for input(s): PROBNP in the last 8760 hours. CBG: No results for input(s): GLUCAP in the last 168 hours. D-Dimer: No results for input(s): DDIMER in the last 72 hours. Hgb A1c: No results for input(s): HGBA1C in the last 72 hours. Lipid Profile: No results for input(s): CHOL, HDL, LDLCALC, TRIG, CHOLHDL, LDLDIRECT in the last 72 hours. Thyroid function studies: No results for input(s): TSH, T4TOTAL, T3FREE, THYROIDAB in the last 72 hours.  Invalid input(s): FREET3 Anemia work up: No results for input(s): VITAMINB12, FOLATE, FERRITIN, TIBC, IRON, RETICCTPCT in the last 72 hours. Sepsis Labs: Recent Labs  Lab 07/18/18 2114 07/19/18 0543 07/20/18 1219  WBC 14.1* 16.3* 15.6*   Microbiology No results found for this or any previous visit (from the past 240 hour(s)).   Medications:   . bisacodyl  10 mg Rectal Daily  . feeding supplement (ENSURE ENLIVE)  237 mL Oral BID BM  . multivitamin with minerals   1 tablet Oral Daily  . polyethylene glycol  34 g Oral BID   Continuous Infusions: . sodium chloride 10 mL/hr at 07/21/18 1530     LOS: 3 days   Charlynne Cousins  [facility] Pager [phone]  *Please refer to [email or web address], password [email or web address] to get updated schedule on who will round on this patient, as hospitalists switch teams weekly. If 7PM-7AM, please contact night-coverage at [email or web address], password [email or web address] for any overnight needs.  07/22/2018, 9:18 AM

## 2018-07-22 NOTE — Progress Notes (Addendum)
Pt reports hunger and requesting something to eat. Dr. Olevia Bowens notified. New order given. Pt made aware and tray ordered.

## 2018-07-22 NOTE — Op Note (Signed)
[facility] Patient Name: Jeffery Roman Procedure Date: 07/22/2018 MRN: [id number] Attending MD: [name] , MD Date of Birth: [birth year] CSN: [id number] Age: 52 Admit Type: Inpatient Procedure:                Colonoscopy Indications:              This is the patient's first colonoscopy,                            Generalized abdominal pain, Weight loss Providers:                [name], MD, Zenon Mayo, RN, Laverda Sorenson, Technician, Dellie Catholic, CRNA Referring MD:             hospital team Medicines:                Propofol per Anesthesia, Monitored Anesthesia Care Complications:            No immediate complications. Estimated Blood Loss:     Estimated blood loss was minimal. Procedure:                Pre-Anesthesia Assessment:                           - Prior to the procedure, a History and Physical                            was performed, and patient medications and                            allergies were reviewed. The patient's tolerance of                            previous anesthesia was also reviewed. The risks                            and benefits of the procedure and the sedation                            options and risks were discussed with the patient.                            All questions were answered, and informed consent                            was obtained. Prior Anticoagulants: The patient has                            taken no previous anticoagulant or antiplatelet                            agents. ASA Grade Assessment: III - A patient with  severe systemic disease. After reviewing the risks                            and benefits, the patient was deemed in                            satisfactory condition to undergo the procedure.                           After obtaining informed consent, the colonoscope                            was passed under direct  vision. Throughout the                            procedure, the patient's blood pressure, pulse, and                            oxygen saturations were monitored continuously. The                            [id number]-[id number] ([id number] Olympus peds colonscope was                            introduced through the anus and advanced to the the                            cecum, identified by appendiceal orifice and                            ileocecal valve. The colonoscopy was somewhat                            difficult due to fair prep. Successful completion                            of the procedure was aided by straightening and                            shortening the scope to obtain bowel loop reduction                            and lavage. The patient tolerated the procedure                            well. The quality of the bowel preparation was                            fair. The terminal ileum, ileocecal valve,                            appendiceal orifice, and rectum were photographed. Scope In: 7:47:03 AM Scope Out: 8:07:01 AM Scope Withdrawal Time: 0 hours 15 minutes 38 seconds  Total Procedure Duration: 0 hours 19 minutes 58 seconds  Findings:      The perianal and digital rectal examinations were normal.      A fungating and infiltrative partially obstructing large mass was found       in the ascending colon. The mass was partially circumferential       (involving two-thirds of the lumen circumference). No bleeding was       present. Biopsies were taken with a cold forceps for histology.       Estimated blood loss was minimal. Area (distal edge successfully       tattooed; unsuccessful adequate tattooing of proximal edge) was tattooed       with an injection of 5 mL of [place] ink.      Internal hemorrhoids were found during retroflexion. The hemorrhoids       were medium-sized and Grade I (internal hemorrhoids that do not       prolapse).      The terminal ileum appeared  normal. Impression:               - Preparation of the colon was fair.                           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 Biopsied. Tattooed.                           - Internal hemorrhoids.                           - The examined portion of the ileum was normal. Moderate Sedation:      N/A- Per Anesthesia Care Recommendation:           - Clear liquid diet.                           - Await pathology results.                           - Repeat colonoscopy (date not yet determined) for                            surveillance.                           - Oncology consult and f/u with surgery. Procedure Code(s):        --- Professional ---                           [phone], Colonoscopy, flexible; with directed                            submucosal injection(s), any substance                           [id number], Colonoscopy, flexible; with biopsy, single  or multiple Diagnosis Code(s):        --- Professional ---                           R10.84, Generalized abdominal pain                           C18.2, Malignant neoplasm of ascending colon                           K56.690, Other partial intestinal obstruction                           K64.0, First degree hemorrhoids                           R63.4, Abnormal weight loss CPT copyright 2018 [employer]. All rights reserved. The codes documented in this report are preliminary and upon coder review may  be revised to meet current compliance requirements. [name], MD 07/22/2018 8:20:31 AM This report has been signed electronically. Number of Addenda: 0

## 2018-07-23 ENCOUNTER — Other Ambulatory Visit: Payer: Self-pay | Admitting: Hematology

## 2018-07-23 ENCOUNTER — Encounter (HOSPITAL_COMMUNITY): Payer: Self-pay | Admitting: Gastroenterology

## 2018-07-23 ENCOUNTER — Inpatient Hospital Stay (HOSPITAL_COMMUNITY): Payer: BLUE CROSS/BLUE SHIELD

## 2018-07-23 DIAGNOSIS — K859 Acute pancreatitis without necrosis or infection, unspecified: Secondary | ICD-10-CM

## 2018-07-23 DIAGNOSIS — R740 Nonspecific elevation of levels of transaminase and lactic acid dehydrogenase [LDH]: Secondary | ICD-10-CM

## 2018-07-23 DIAGNOSIS — N179 Acute kidney failure, unspecified: Secondary | ICD-10-CM

## 2018-07-23 DIAGNOSIS — K5900 Constipation, unspecified: Secondary | ICD-10-CM

## 2018-07-23 DIAGNOSIS — J90 Pleural effusion, not elsewhere classified: Secondary | ICD-10-CM

## 2018-07-23 DIAGNOSIS — C182 Malignant neoplasm of ascending colon: Principal | ICD-10-CM

## 2018-07-23 DIAGNOSIS — I10 Essential (primary) hypertension: Secondary | ICD-10-CM

## 2018-07-23 DIAGNOSIS — C787 Secondary malignant neoplasm of liver and intrahepatic bile duct: Secondary | ICD-10-CM

## 2018-07-23 LAB — BASIC METABOLIC PANEL
Anion gap: 9 (ref 5–15)
BUN: 21 mg/dL — AB (ref 6–20)
CALCIUM: 10 mg/dL (ref 8.9–10.3)
CO2: 20 mmol/L — ABNORMAL LOW (ref 22–32)
CREATININE: 1.17 mg/dL (ref 0.61–1.24)
Chloride: 104 mmol/L (ref 98–111)
GFR calc Af Amer: >60 mL/min (ref >60)
GLUCOSE: 116 mg/dL — AB (ref 70–99)
POTASSIUM: 5.2 mmol/L — AB (ref 3.5–5.1)
SODIUM: 133 mmol/L — AB (ref 135–145)

## 2018-07-23 LAB — LACTATE DEHYDROGENASE: LDH: 678 U/L — AB (ref 98–192)

## 2018-07-23 MED ORDER — POLYETHYLENE GLYCOL 3350 17 G PO PACK
17.0000 g | PACK | Freq: Every day | ORAL | Status: DC
  Administered 2018-07-23 – 2018-07-25 (×2): 17 g via ORAL
  Filled 2018-07-23 (×4): qty 1

## 2018-07-23 MED ORDER — IOHEXOL 300 MG/ML  SOLN
75.0000 mL | Freq: Once | INTRAMUSCULAR | Status: AC | PRN
  Administered 2018-07-23: 75 mL via INTRAVENOUS

## 2018-07-23 MED ORDER — SODIUM POLYSTYRENE SULFONATE 15 GM/60ML PO SUSP
15.0000 g | Freq: Once | ORAL | Status: DC
  Filled 2018-07-23: qty 60

## 2018-07-23 MED ORDER — PATIROMER SORBITEX CALCIUM 8.4 G PO PACK
8.4000 g | PACK | Freq: Once | ORAL | Status: AC
  Administered 2018-07-23: 8.4 g via ORAL
  Filled 2018-07-23: qty 1

## 2018-07-23 MED ORDER — SODIUM CHLORIDE 0.9 % IJ SOLN
INTRAMUSCULAR | Status: AC
  Filled 2018-07-23: qty 50

## 2018-07-23 MED ORDER — GADOBUTROL 1 MMOL/ML IV SOLN
9.0000 mL | Freq: Once | INTRAVENOUS | Status: AC | PRN
  Administered 2018-07-23: 9 mL via INTRAVENOUS

## 2018-07-23 NOTE — Progress Notes (Signed)
[place]  Telephone:([phone]) [name] Single  DOB: 11-Oct-1965  MR#: [id number]  CSN#: [id number]    Requesting Physician: [name]  Patient Care Team: Jeffery Roman, Jeffery Jews.Marlou Sa, MD as PCP - General (Family Medicine)  Reason for consult:  Colon cancer with multiple metastatic lesions to the liver  History of present illness:    Jeffery Roman is a 52 y.o. male who is initially presented to the ER for abdominal pain, and was found to be in acute renal failure with mild pancreatitis.  He states that he was initially believed to have prostatitis and was given Cipro, that made him nauseated. Nausea medication didn't relief his nausea and he eventually started vomiting. His symptoms kept worsening.  He states that his abdominal pain was 10/10 in Wednesday, but has now improved. He states that he noticed that he was constipated before coming to the hospital and his stool consistency was "like small balls". He previously had regular stools. He denies blood in stool or dark urine. He denies weight changed. He noticed a cough that started after laying in hospital bed for long. He denies fever, CP, or chills. He denies LL edema, but has noticed knee swelling.  He has HTN. Denies other medical conditions. He denies previous surgeries.  He works in Engineer, technical sales. No smoking, rarely drinks alcohol.  MEDICAL HISTORY:  Past Medical History:  Diagnosis Date  . Hypertension     SURGICAL HISTORY: Past Surgical History:  Procedure Laterality Date  . BIOPSY  07/22/2018   Procedure: BIOPSY;  Surgeon: Jeffery Corner, MD;  Location: [facility];  Service: Endoscopy;;  . COLONOSCOPY WITH PROPOFOL N/A 07/22/2018   Procedure: COLONOSCOPY WITH PROPOFOL;  Surgeon: Jeffery Corner, MD;  Location: [facility];  Service: Endoscopy;  Laterality: N/A;  . SUBMUCOSAL INJECTION  07/22/2018   Procedure: SUBMUCOSAL INJECTION;  Surgeon: Jeffery Corner, MD;  Location: [facility];  Service: Endoscopy;;  tattoo    SOCIAL HISTORY: Social History   Socioeconomic History  . Marital status: Married    Spouse name: Not on file  . Number of children: Not on file  . Years of education: Not on file  . Highest education level: Not on file  Occupational History  . Not on file  Social Needs  . Financial resource strain: Not on file  . Food insecurity:    Worry: Not on file    Inability: Not on file  . Transportation needs:    Medical: Not on file    Non-medical: Not on file  Tobacco Use  . Smoking status: Never Smoker  . Smokeless tobacco: Never Used  Substance and Sexual Activity  . Alcohol use: Not on file  . Drug use: Not on file  . Sexual activity: Not on file  Lifestyle  . Physical activity:    Days per week: Not on file    Minutes per session: Not on file  . Stress: Not on file  Relationships  . Social connections:    Talks on phone: Not on file    Gets together: Not on file    Attends religious service: Not on file    Active member of club or organization: Not on file    Attends meetings of clubs or organizations: Not on file    Relationship status: Not on file  . Intimate partner violence:    Fear of current or ex partner: Not on file    Emotionally abused: Not  on file    Physically abused: Not on file    Forced sexual activity: Not on file  Other Topics Concern  . Not on file  Social History Narrative  . Not on file    FAMILY HISTORY: Family History  Problem Relation Age of Onset  . Diabetes Mellitus II Mother   . Prostate cancer Father   . Colon cancer Maternal Grandmother     ALLERGIES:  is allergic to ciprofloxacin and lisinopril.  MEDICATIONS:  Current Facility-Administered Medications  Medication Dose Route Frequency Provider Last Rate Last Dose  . 0.9 %  sodium chloride infusion   Intravenous Continuous Jeffery Corner, MD 10 mL/hr at 07/21/18 1530    . acetaminophen (TYLENOL) tablet  650 mg  650 mg Oral Q6H PRN Jeffery Corner, MD   650 mg at 07/21/18 2203   Or  . acetaminophen (TYLENOL) suppository 650 mg  650 mg Rectal Q6H PRN Jeffery Corner, MD      . bisacodyl (DULCOLAX) suppository 10 mg  10 mg Rectal Daily Jeffery Corner, MD   10 mg at 07/21/18 1217  . feeding supplement (ENSURE ENLIVE) (ENSURE ENLIVE) liquid 237 mL  237 mL Oral BID BM Jeffery Corner, MD   237 mL at 07/23/18 1001  . fentaNYL (SUBLIMAZE) injection 50 mcg  50 mcg Intravenous Q2H PRN Jeffery Corner, MD   50 mcg at 07/21/18 1430  . hydrALAZINE (APRESOLINE) injection 10 mg  10 mg Intravenous Q4H PRN Jeffery Corner, MD   10 mg at 07/21/18 2203  . morphine 4 MG/ML injection 4 mg  4 mg Intravenous Q3H PRN Charlynne Cousins, MD   4 mg at 07/23/18 1022  . multivitamin with minerals tablet 1 tablet  1 tablet Oral Daily Jeffery Corner, MD   1 tablet at 07/23/18 1001  . ondansetron (ZOFRAN) tablet 4 mg  4 mg Oral Q6H PRN Jeffery Corner, MD       Or  . ondansetron [facility]) injection 4 mg  4 mg Intravenous Q6H PRN Jeffery Corner, MD   4 mg at 07/23/18 1022  . sodium polystyrene (KAYEXALATE) 15 GM/60ML suspension 15 g  15 g Oral Once Aileen Fass, Tammi Klippel, MD      . zolpidem Lorrin Mais) tablet 5 mg  5 mg Oral QHS PRN Jeffery Corner, MD   5 mg at 07/20/18 2203    REVIEW OF SYSTEMS:   Constitutional: Denies fevers, chills or abnormal night sweats Eyes: Denies blurriness of vision, double vision or watery eyes Ears, nose, mouth, throat, and face: Denies mucositis or sore throat Respiratory: Denies cough, dyspnea or wheezes Cardiovascular: Denies palpitation, chest discomfort or lower extremity swelling Gastrointestinal:  Denies nausea, heartburn (+) constipation (+) abdominal pain, improved Skin: Denies abnormal skin rashes Lymphatics: Denies new lymphadenopathy or easy bruising Neurological:Denies numbness, tingling or new weaknesses Behavioral/Psych: Mood is stable, no new changes  All  other systems were reviewed with the patient and are negative.  PHYSICAL EXAMINATION: ECOG PERFORMANCE STATUS: 1 - Symptomatic but completely ambulatory  Vitals:   07/23/18 0618 07/23/18 1347  BP: 136/82 126/86  Pulse: 76 84  Resp: 16 20  Temp: (!) 97.5 F (36.4 C) 97.8 F (36.6 C)  SpO2: 95% 96%   Filed Weights   07/19/18 0237 07/20/18 0522 07/23/18 0618  Weight: 195 lb 4.8 oz (88.6 kg) 201 lb 11.2 oz (91.5 kg) 201 lb 15.1 oz (91.6 kg)    GENERAL:alert, no distress and comfortable SKIN: skin color, texture, turgor are normal, no rashes or significant  lesions EYES: normal, conjunctiva are pink and non-injected, sclera clear OROPHARYNX:no exudate, no erythema and lips, buccal mucosa, and tongue normal  NECK: supple, thyroid normal size, non-tender, without nodularity LYMPH:  no palpable lymphadenopathy in the cervical, axillary or inguinal LUNGS: clear to auscultation and percussion with normal breathing effort HEART: regular rate & rhythm and no murmurs and no lower extremity edema ABDOMEN:abdomen soft, mild tenderness at RUQ and epigastric area  Musculoskeletal:no cyanosis of digits and no clubbing  PSYCH: alert & oriented x 3 with fluent speech NEURO: no focal motor/sensory deficits  LABORATORY DATA:  I have reviewed the data as listed Lab Results  Component Value Date   WBC 15.6 (H) 07/20/2018   HGB 10.9 (L) 07/22/2018   HCT 32.0 (L) 07/22/2018   MCV 87.8 07/20/2018   PLT 291 07/20/2018   Recent Labs    07/18/18 2114 07/19/18 0543  07/20/18 1219 07/22/18 0722 07/22/18 0952 07/23/18 0533  NA 128* 129*   < > 132* 131* 132* 133*  K 4.7 6.0*   < > 5.8* 4.3 4.6 5.2*  CL 94* 99   < > 104  --  100 104  CO2 23 21*   < > 21*  --  23 20*  GLUCOSE 115* 96   < > 71  --  92 116*  BUN 17 17   < > 16  --  17 21*  CREATININE 1.75* 1.63*   < > 1.36*  --  1.16 1.17  CALCIUM 12.6* 11.5*   < > 10.1  --  9.9 10.0  GFRNONAA 44* 48*   < > 60*  --  >60 >60  GFRAA 51* 56*   < >  >60  --  >60 >60  PROT 8.0 7.2  --  6.0*  --   --   --   ALBUMIN 3.3* 2.9*  --  2.5*  --   --   --   AST 58* 59*  --  69*  --   --   --   ALT 45* 41  --  36  --   --   --   ALKPHOS 335* 283*  --  262*  --   --   --   BILITOT 1.0 1.1  --  2.4*  --   --   --   BILIDIR  --  0.4*  --   --   --   --   --   IBILI  --  0.7  --   --   --   --   --    < > = values in this interval not displayed.   Procedures  07/22/2018 Colonoscopy  Findings: The perianal and digital rectal examinations were normal. A fungating and infiltrative partially obstructing large mass was found in the ascending colon. The mass was partially circumferential (involving two-thirds of the lumen circumference). No bleeding was present. Biopsies were taken with a cold forceps for histology. Estimated blood loss was minimal. Area (distal edge successfully tattooed; unsuccessful adequate tattooing of proximal edge) was tattooed with an injection of 5 mL of [place] ink. Internal hemorrhoids were found during retroflexion. The hemorrhoids were medium-sized and Grade I (internal hemorrhoids that do not prolapse). The terminal ileum appeared normal.  Impression: - Preparation of the colon was fair. - Malignant partially obstructing tumor in the ascending colon. Biopsied. Tattooed. - Internal hemorrhoids. - The examined portion of the ileum was normal.  RADIOGRAPHIC STUDIES: I have personally reviewed the radiological images  as listed and agreed with the findings in the report.  07/23/2018 MRI abdomen IMPRESSION: 1. There are innumerable peripherally enhancing liver lesions most concerning for diffuse metastatic disease. 2. Mild inflammatory changes of the pancreas compatible with pancreatitis. No complications identified. 3. Ascending colon mass concerning for primary colonic neoplasm. A soft tissue nodule is noted within the right lower quadrant of the abdomen suspicious for ileocolic mesenteric adenopathy. 4.  Bilateral pleural effusions.  07/18/2018 CT AP IMPRESSION: 1. Acute pancreatitis without necrosis. 2. Eccentric ascending colon wall thickening concerning for primary neoplasm. Multiple hepatic metastasis. Recommend colonoscopy and/or PET-CT on non emergent basis. 3. Acute findings discussed with and reconfirmed by Dr.MOLPUS on 07/18/2018 at 11:42 pm.  Dg Abd 1 View - Kub  Result Date: 07/20/2018 CLINICAL DATA:  Evaluate for free peritoneal air. EXAM: ABDOMEN - 1 VIEW COMPARISON:  CT 07/18/2018 FINDINGS: Moderate air and contrast throughout the colon. No definite free peritoneal air. Remainder the exam is unremarkable. IMPRESSION: Nonspecific, nonobstructive bowel gas pattern. No definite free peritoneal air. Recommend upright KUB with diaphragms in field of view versus right lateral decubitus film for better evaluation for free air. Electronically Signed   By: Marin Olp M.D.   On: 07/20/2018 11:03   Ct Chest W Contrast  Result Date: 07/23/2018 CLINICAL DATA:  Inpatient admitted with abdominal pain, pancreatitis and weight loss. Malignant-appearing partially obstructing tumor in the ascending colon biopsied 1 day prior at colonoscopy, pathology pending. Multiple liver masses suspicious for liver metastases at CT. Chest staging. EXAM: CT CHEST WITH CONTRAST TECHNIQUE: Multidetector CT imaging of the chest was performed during intravenous contrast administration. CONTRAST:  81mL OMNIPAQUE IOHEXOL 300 MG/ML  SOLN COMPARISON:  07/18/2018 CT abdomen/pelvis. FINDINGS: Cardiovascular: Normal heart size. No significant pericardial effusion/thickening. Great vessels are normal in course and caliber. No central pulmonary emboli. Mediastinum/Nodes: No discrete thyroid nodules. Unremarkable esophagus. No pathologically enlarged axillary, mediastinal or hilar lymph nodes. Lungs/Pleura: No pneumothorax. Small dependent bilateral pleural effusions. Mild-to-moderate compressive atelectasis in the dependent  lower lobes, left greater than right. No acute consolidative airspace disease, lung masses or significant pulmonary nodules. Upper abdomen: Numerous hypodense liver masses scattered throughout the visualized liver, unchanged from recent CT. Musculoskeletal:  No aggressive appearing focal osseous lesions. IMPRESSION: 1. No findings suspicious for metastatic disease in the chest. 2. Small dependent bilateral pleural effusions with associated dependent lower lobe atelectasis. 3. Redemonstration of innumerable hypodense liver masses suspicious for liver metastases. Electronically Signed   By: Ilona Sorrel M.D.   On: 07/23/2018 13:55   Ct Abdomen Pelvis W Contrast  Addendum Date: 07/23/2018   ADDENDUM REPORT: 07/23/2018 15:19 ADDENDUM: RIGHT lower quadrant 3.2 cm solid nodule (axial 69/112) consistent with metastatic lymphadenopathy. Electronically Signed   By: Elon Alas M.D.   On: 07/23/2018 15:19   Result Date: 07/23/2018 CLINICAL DATA:  Worsening epigastric pain for a week, vomiting today. On treatment for prostatitis. EXAM: CT ABDOMEN AND PELVIS WITH CONTRAST TECHNIQUE: Multidetector CT imaging of the abdomen and pelvis was performed using the standard protocol following bolus administration of intravenous contrast. CONTRAST:  151mL ISOVUE-300 IOPAMIDOL (ISOVUE-300) INJECTION 61% COMPARISON:  CT pelvis December 08, 2014 FINDINGS: LOWER CHEST: No pericardial effusions. Lower lobe atelectasis. Heart size is normal HEPATOBILIARY: Numerous rounded hypodensities throughout the liver measuring to 3.7 cm. Liver size is normal. Normal gallbladder. PANCREAS: Peripancreatic inflammation without parenchymal hypodensity, ductal dilatation, abnormal calcifications or mass. SPLEEN: Normal. ADRENALS/URINARY TRACT: Kidneys are orthotopic, demonstrating symmetric enhancement. No nephrolithiasis, hydronephrosis or solid renal masses. The  unopacified ureters are normal in course and caliber. Delayed imaging through the  kidneys demonstrates symmetric prompt contrast excretion within the proximal urinary collecting system. Urinary bladder is partially distended and unremarkable. Normal adrenal glands. STOMACH/BOWEL: Eccentric posterior thickening of the ascending colon (axial image 61). The stomach, small bowel are normal in course and caliber without inflammatory changes. Normal appendix. VASCULAR/LYMPHATIC: Aortoiliac vessels are normal in course and caliber. No lymphadenopathy by CT size criteria. REPRODUCTIVE: Normal. OTHER: No intraperitoneal free fluid or free air. Small amount of ascites predominantly in the LEFT upper quadrant and LEFT pericolic gutter. No intraperitoneal free air or focal fluid collection. MUSCULOSKELETAL: Nonacute. Severe L5-S1 disc height loss with endplate sclerosis and vacuum disc compatible with degenerative disc resulting in severe LEFT L5-S1 neural foraminal narrowing. IMPRESSION: 1. Acute pancreatitis without necrosis. 2. Eccentric ascending colon wall thickening concerning for primary neoplasm. Multiple hepatic metastasis. Recommend colonoscopy and/or PET-CT on non emergent basis. 3. Acute findings discussed with and reconfirmed by Dr.MOLPUS on 07/18/2018 at 11:42 pm. Electronically Signed: By: Elon Alas M.D. On: 07/18/2018 23:45   Mr 3d Recon At Scanner  Result Date: 07/23/2018 CLINICAL DATA:  Pancreatic adenocarcinoma. EXAM: MRI ABDOMEN WITHOUT AND WITH CONTRAST (INCLUDING MRCP) TECHNIQUE: Multiplanar multisequence MR imaging of the abdomen was performed both before and after the administration of intravenous contrast. Heavily T2-weighted images of the biliary and pancreatic ducts were obtained, and three-dimensional MRCP images were rendered by post processing. CONTRAST:  9 cc of Gadavist COMPARISON:  CT AP 07/18/2018 FINDINGS: Lower chest: There are small to moderate bilateral pleural effusions identified. Hepatobiliary: Innumerable peripheral enhancing lesions are identified  throughout both lobes of liver compatible with metastatic disease. -index lesion within segment 2 measures 2.9 cm, image 19/3. -Index lesion within segment 4 a measures 3.9 cm, image 21/3. -Index lesion within segment 8 measures 3.8 cm, image number 20/3. -index segment 6 lesion measures 3.4 cm, image 46/3. The gallbladder appears unremarkable.  No biliary dilatation. Pancreas: Mild diffuse pancreatic edema noted. No discrete mass identified. No main duct dilatation. Spleen:  Within normal limits in size and appearance. Adrenals/Urinary Tract: Normal appearance of the adrenal glands. No kidney mass or hydronephrosis identified. Stomach/Bowel: No abnormal bowel dilatation identified. Right colon mass is noted measuring 4.3 cm. Vascular/Lymphatic: Normal appearance of the abdominal aorta. No aneurysm. Within the right lower quadrant of the abdomen there is and enlarged enhancing lymph node measuring 2.7 cm, image 35/12. Other:  None Musculoskeletal: No suspicious bone lesions identified. IMPRESSION: 1. There are innumerable peripherally enhancing liver lesions most concerning for diffuse metastatic disease. 2. Mild inflammatory changes of the pancreas compatible with pancreatitis. No complications identified. 3. Ascending colon mass concerning for primary colonic neoplasm. A soft tissue nodule is noted within the right lower quadrant of the abdomen suspicious for ileocolic mesenteric adenopathy. 4. Bilateral pleural effusions. Electronically Signed   By: Kerby Moors M.D.   On: 07/23/2018 11:35   Mr Abdomen Mrcp Moise Boring Contast  Result Date: 07/23/2018 CLINICAL DATA:  Pancreatic adenocarcinoma. EXAM: MRI ABDOMEN WITHOUT AND WITH CONTRAST (INCLUDING MRCP) TECHNIQUE: Multiplanar multisequence MR imaging of the abdomen was performed both before and after the administration of intravenous contrast. Heavily T2-weighted images of the biliary and pancreatic ducts were obtained, and three-dimensional MRCP images were  rendered by post processing. CONTRAST:  9 cc of Gadavist COMPARISON:  CT AP 07/18/2018 FINDINGS: Lower chest: There are small to moderate bilateral pleural effusions identified. Hepatobiliary: Innumerable peripheral enhancing lesions are identified throughout both lobes of  liver compatible with metastatic disease. -index lesion within segment 2 measures 2.9 cm, image 19/3. -Index lesion within segment 4 a measures 3.9 cm, image 21/3. -Index lesion within segment 8 measures 3.8 cm, image number 20/3. -index segment 6 lesion measures 3.4 cm, image 46/3. The gallbladder appears unremarkable.  No biliary dilatation. Pancreas: Mild diffuse pancreatic edema noted. No discrete mass identified. No main duct dilatation. Spleen:  Within normal limits in size and appearance. Adrenals/Urinary Tract: Normal appearance of the adrenal glands. No kidney mass or hydronephrosis identified. Stomach/Bowel: No abnormal bowel dilatation identified. Right colon mass is noted measuring 4.3 cm. Vascular/Lymphatic: Normal appearance of the abdominal aorta. No aneurysm. Within the right lower quadrant of the abdomen there is and enlarged enhancing lymph node measuring 2.7 cm, image 35/12. Other:  None Musculoskeletal: No suspicious bone lesions identified. IMPRESSION: 1. There are innumerable peripherally enhancing liver lesions most concerning for diffuse metastatic disease. 2. Mild inflammatory changes of the pancreas compatible with pancreatitis. No complications identified. 3. Ascending colon mass concerning for primary colonic neoplasm. A soft tissue nodule is noted within the right lower quadrant of the abdomen suspicious for ileocolic mesenteric adenopathy. 4. Bilateral pleural effusions. Electronically Signed   By: Kerby Moors M.D.   On: 07/23/2018 11:35    ASSESSMENT & PLAN:  Jeffery Roman is a 52 y.o. male with history of HTN, presented with 3 weeks history of epigastric pain, intermittent nausea and vomiting  1. Colon  Cancer with diffuse metastasis to liver -I reviewed imaging studies and labs with him in person.  Unfortunately his CT scan showed a diffuse liver lesions, highly suspicious for metastatic disease from his colon cancer. -I recommend a liver biopsy to confirm metastasis, he is agreeable. -He has been seen by general surgeon Dr. Tera Helper.  He does not have significant bowel obstruction, bleeding, giving his metastatic disease, upfront hemicolectomy is probably not indicated at this point, I  Agree with Dr Tera Helper.  -We discussed systemic therapy options, including chemotherapy, immunotherapy, and a biological agent, such as Avastin.  Given his young age, overall excellent performance status, I recommend him to do FOFOXFRIRI as first line therapy  --Chemotherapy consent: Side effects including but does not not limited to, fatigue, nausea, vomiting, diarrhea, hair loss, neuropathy, fluid retention, renal and kidney dysfunction, neutropenic fever, needed for blood transfusion, bleeding, were discussed with patient in great detail. He agrees to proceed. -he would need a port placement  -I will schedule chemo class and see him next week to start treatment as soon as possible  2.  Transaminitis and hyperbilirubinemia -Secondary to diffuse liver metastasis, no evidence of biliary obstruction  3.  Pancreatitis -Continue IV fluids and pain management -I think his abdominal pain mainly from his liver metastasis, less from his pancreatitis  4. AKI -Secondary to dehydration from poor oral intake and nausea vomiting -improved   5. B/l pleural effusion  -Etiology is unclear, malignant effusion is not ruled out, although he has no evidence of pulmonary metastasis on the scan -will f/u   6. Hypercalcemia -Secondary to malignancy -improved now after IVF  -please obtain an bone scan to rule out bone metastasis   Recommendations: -bone scan tomorrow -repeat LFTs tomorrow -IR liver biopsy before discharge  if possible  -f/u in one week to start chemo   All questions were answered. The patient knows to call the clinic with any problems, questions or concerns.     Truitt Merle, MD 07/23/2018 5:40 PM

## 2018-07-23 NOTE — Progress Notes (Signed)
Patient did well overnight but he did have an episode of profuse sweating; through gown and sheets. Patient did not have a fever and vitals were stable this morning. Will continue to closely monitor patient.

## 2018-07-23 NOTE — Progress Notes (Signed)
[name] PROGRESS NOTE    Progress Note  Jeffery Roman  TDS:[id number] DOB: 1965-11-22 DOA: 07/18/2018 PCP: Alroy Dust, L.Marlou Sa, MD     Brief Narrative:   Jeffery Roman is an 52 y.o. male past medical history of hypertension comes in complaining of abdominal pain, she recently seen her PCP and was started on.  Antibiotics no improvement came into the ER was found to be in acute renal failure with mild pancreatitis calcium of 12.6 bilirubin of 1, CT scan of the abdomen pelvis showed pancreatitis with a colonic mass with multiple metastatic lesions to the liver.  Assessment/Plan:   Acute pancreatitis: It appears to be resolved. He relates he is tolerating his liquid diet and and he has remained pain-free. We will advance to full liquid diet.  Acute kidney injury: Likely prerenal azotemia resolved with IV fluid hydration.  Hyperkalemia: Again today on no supplementation except heparin which can cause on rare cases increasing her potassium will give oral Kayexalate. Phosphorus and LDH question of tissue destruction is causing this increase in potassium.  Colonic neoplasm with metastatic liver disease: Hemoglobin this morning is 10.9 we will continue to trend hemoglobin. GI was consulted who recommended a colonoscopy done on 07/22/2018 that showed a partially obstructed mass in the ascending colon and concern for adenocarcinoma. Pathology reports are pending. MRCP is pending. CT scan of the chest once his creatinine is at goal GFR. Oncology has been consulted.  Hypercalcemia Resolved with IV fluids   Essential hypertension Hold antihypertensive medication.   DVT prophylaxis: SCD Family Communication:None Disposition Plan/Barrier to D/C: unable to determine Code Status:     Code Status Orders  (From admission, onward)         Start     Ordered   07/19/18 0316  Full code  Continuous     07/19/18 0317        Code Status History    This patient has a current  code status but no historical code status.        IV Access:    Peripheral IV   Procedures and diagnostic studies:   No results found.   Medical Consultants:    None.  Anti-Infectives:   None  Subjective:    Jeffery Roman pain is resolved he is tolerating his clear liquid diet he would like his diet advanced. Objective:    Vitals:   07/22/18 1329 07/22/18 2026 07/23/18 0158 07/23/18 0618  BP: (!) 156/90 (!) 155/97  136/82  Pulse: 90 87  76  Resp: 18 20  16   Temp: 99 F (37.2 C) 98.4 F (36.9 C) 98.1 F (36.7 C) (!) 97.5 F (36.4 C)  TempSrc: Oral Oral Oral Oral  SpO2: 91% 95%  95%  Weight:    91.6 kg  Height:        Intake/Output Summary (Last 24 hours) at 07/23/2018 0951 Last data filed at 07/23/2018 0700 Gross per 24 hour  Intake 2396.28 ml  Output -  Net 2396.28 ml   Filed Weights   07/19/18 0237 07/20/18 0522 07/23/18 0618  Weight: 88.6 kg 91.5 kg 91.6 kg    Exam: General exam: In no acute distress. Respiratory system: Good air movement and clear to auscultation. Cardiovascular system: S1 & S2 heard, RRR. Gastrointestinal system: Abdomen is nondistended, soft and nontender.  Central nervous system: Alert and oriented. No focal neurological deficits. Extremities: No pedal edema. Skin: No rashes, lesions or ulcers Psychiatry: Judgement and insight appear normal. Mood & affect appropriate.  Data Reviewed:    Labs: Basic Metabolic Panel: Recent Labs  Lab 07/19/18 0543 07/19/18 1449 07/20/18 1219 07/22/18 0722 07/22/18 0952 07/23/18 0533  NA 129* 130* 132* 131* 132* 133*  K 6.0* 5.6* 5.8* 4.3 4.6 5.2*  CL 99 102 104  --  100 104  CO2 21* 22 21*  --  23 20*  GLUCOSE 96 89 71  --  92 116*  BUN 17 17 16   --  17 21*  CREATININE 1.63* 1.54* 1.36*  --  1.16 1.17  CALCIUM 11.5* 10.6* 10.1  --  9.9 10.0  MG 1.9  --   --   --   --   --    GFR Estimated Creatinine Clearance: 88.4 mL/min (by C-G formula based on SCr of 1.17  mg/dL). Liver Function Tests: Recent Labs  Lab 07/18/18 2114 07/19/18 0543 07/20/18 1219  AST 58* 59* 69*  ALT 45* 41 36  ALKPHOS 335* 283* 262*  BILITOT 1.0 1.1 2.4*  PROT 8.0 7.2 6.0*  ALBUMIN 3.3* 2.9* 2.5*   Recent Labs  Lab 07/18/18 2114 07/20/18 1219  LIPASE 1,104* 214*   No results for input(s): AMMONIA in the last 168 hours. Coagulation profile No results for input(s): INR, PROTIME in the last 168 hours.  CBC: Recent Labs  Lab 07/18/18 2114 07/19/18 0543 07/20/18 1219 07/22/18 0722  WBC 14.1* 16.3* 15.6*  --   NEUTROABS  --  13.7*  --   --   HGB 13.3 12.4* 11.0* 10.9*  HCT 41.0 38.7* 34.6* 32.0*  MCV 84.7 86.4 87.8  --   PLT 409* 359 291  --    Cardiac Enzymes: Recent Labs  Lab 07/19/18 0543  TROPONINI <0.03   BNP (last 3 results) No results for input(s): PROBNP in the last 8760 hours. CBG: No results for input(s): GLUCAP in the last 168 hours. D-Dimer: No results for input(s): DDIMER in the last 72 hours. Hgb A1c: No results for input(s): HGBA1C in the last 72 hours. Lipid Profile: No results for input(s): CHOL, HDL, LDLCALC, TRIG, CHOLHDL, LDLDIRECT in the last 72 hours. Thyroid function studies: No results for input(s): TSH, T4TOTAL, T3FREE, THYROIDAB in the last 72 hours.  Invalid input(s): FREET3 Anemia work up: No results for input(s): VITAMINB12, FOLATE, FERRITIN, TIBC, IRON, RETICCTPCT in the last 72 hours. Sepsis Labs: Recent Labs  Lab 07/18/18 2114 07/19/18 0543 07/20/18 1219  WBC 14.1* 16.3* 15.6*   Microbiology No results found for this or any previous visit (from the past 240 hour(s)).   Medications:   . bisacodyl  10 mg Rectal Daily  . feeding supplement (ENSURE ENLIVE)  237 mL Oral BID BM  . multivitamin with minerals  1 tablet Oral Daily  . sodium polystyrene  15 g Oral Once   Continuous Infusions: . sodium chloride 10 mL/hr at 07/21/18 1530     LOS: 4 days   Jeffery Roman  [name] Pager  [phone]  *Please refer to [email or web address], password [email or web address] to get updated schedule on who will round on this patient, as hospitalists switch teams weekly. If 7PM-7AM, please contact night-coverage at [email or web address], password [email or web address] for any overnight needs.  07/23/2018, 9:51 AM

## 2018-07-23 NOTE — Progress Notes (Signed)
  [facility] Progress Note  1 Day Post-Op  Subjective: CC-  Feeling better today. Still having some abdominal discomfort, but it's improving. Tolerating full liquids. No n/v. Passing flatus. Reports multiple loose BMs over night.  Went for MRCP this morning, results pending.  Objective: Vital signs in last 24 hours: Temp:  [97.5 F (36.4 C)-99 F (37.2 C)] 97.5 F (36.4 C) (10/28 0618) Pulse Rate:  [76-90] 76 (10/28 0618) Resp:  [16-20] 16 (10/28 0618) BP: (136-156)/(82-97) 136/82 (10/28 0618) SpO2:  [91 %-95 %] 95 % (10/28 0618) Weight:  [91.6 kg] 91.6 kg (10/28 0618) Last BM Date: 07/22/18  Intake/Output from previous day: 10/27 0701 - 10/28 0700 In: 2696.3 [P.O.:360; I.V.:2336.3] Out: 0  Intake/Output this shift: No intake/output data recorded.  PE: Gen:  Alert, NAD, pleasant HEENT: EOM's intact, pupils equal and round Card:  RRR Pulm:  CTAB, no W/R/R, effort normal Abd: Soft, mild distension, nontender, +BS, no HSM Psych: A&Ox3  Skin: no rashes noted, warm and dry  Lab Results:  Recent Labs    07/20/18 1219 07/22/18 0722  WBC 15.6*  --   HGB 11.0* 10.9*  HCT 34.6* 32.0*  PLT 291  --    BMET Recent Labs    07/22/18 0952 07/23/18 0533  NA 132* 133*  K 4.6 5.2*  CL 100 104  CO2 23 20*  GLUCOSE 92 116*  BUN 17 21*  CREATININE 1.16 1.17  CALCIUM 9.9 10.0   PT/INR No results for input(s): LABPROT, INR in the last 72 hours. CMP     Component Value Date/Time   NA 133 (L) 07/23/2018 0533   K 5.2 (H) 07/23/2018 0533   CL 104 07/23/2018 0533   CO2 20 (L) 07/23/2018 0533   GLUCOSE 116 (H) 07/23/2018 0533   BUN 21 (H) 07/23/2018 0533   CREATININE 1.17 07/23/2018 0533   CALCIUM 10.0 07/23/2018 0533   PROT 6.0 (L) 07/20/2018 1219   ALBUMIN 2.5 (L) 07/20/2018 1219   AST 69 (H) 07/20/2018 1219   ALT 36 07/20/2018 1219   ALKPHOS 262 (H) 07/20/2018 1219   BILITOT 2.4 (H) 07/20/2018 1219   GFRNONAA >60 07/23/2018 0533   GFRAA >60  07/23/2018 0533   Lipase     Component Value Date/Time   LIPASE 214 (H) 07/20/2018 1219       Studies/Results: No results found.  Anti-infectives: Anti-infectives (From admission, onward)   None       Assessment/Plan HTN  Acute pancreatitis - uncertain etiology, suspect extrinsic compression from lymphadenopathy - MRCP done, results pending  Ascending colon mass, liver metastasis, lymphadenopathy - s/p colonoscopy 10/27, biopsy pathology pending - CEA 33.7 (10/25) - usually do not do surgery first in this situation unless requires palliative resection because of uncontrolled bleeding, perforation, or obstruction... Which patient has no evidence of at this time - oncology consult pending  ID - none FEN - IVF, FLD, Ensure VTE - SCDs, ok for chemical DVT prophylaxis from surgical standpoint Foley - none Follow up - TBD   LOS: 4 days    Wellington Hampshire , [facility] 07/23/2018, 10:29 AM Pager: [phone] Mon 7:00 am -11:30 AM Tues-Fri 7:00 am-4:30 pm Sat-Sun 7:00 am-11:30 am

## 2018-07-23 NOTE — Progress Notes (Signed)
[facility] Progress Note  Subjective: No major complaints today. Patient is being seen for metastatic colon cancer and acute pancreatitis. He has some abdominal pain but not much today. He had an MRCP today. He is awaiting oncology consultation.  Objective: Vital signs in last 24 hours: Temp:  [97.5 F (36.4 C)-99 F (37.2 C)] 97.5 F (36.4 C) (10/28 0618) Pulse Rate:  [76-90] 76 (10/28 0618) Resp:  [16-20] 16 (10/28 0618) BP: (136-156)/(82-97) 136/82 (10/28 0618) SpO2:  [91 %-95 %] 95 % (10/28 0618) Weight:  [91.6 kg] 91.6 kg (10/28 0618) Weight change:    PE:  No distress  Heart regular rhythm  Lungs clear  Abdomen soft and nontender  Lab Results: Results for orders placed or performed during the hospital encounter of 07/18/18 (from the past 24 hour(s))  Basic metabolic panel     Status: Abnormal   Collection Time: 07/22/18  9:52 AM  Result Value Ref Range   Sodium 132 (L) 135 - 145 mmol/L   Potassium 4.6 3.5 - 5.1 mmol/L   Chloride 100 98 - 111 mmol/L   CO2 23 22 - 32 mmol/L   Glucose, Bld 92 70 - 99 mg/dL   BUN 17 6 - 20 mg/dL   Creatinine, Ser 1.16 0.61 - 1.24 mg/dL   Calcium 9.9 8.9 - 10.3 mg/dL   GFR calc non Af Amer >60 >60 mL/min   GFR calc Af Amer >60 >60 mL/min   Anion gap 9 5 - 15  Basic metabolic panel     Status: Abnormal   Collection Time: 07/23/18  5:33 AM  Result Value Ref Range   Sodium 133 (L) 135 - 145 mmol/L   Potassium 5.2 (H) 3.5 - 5.1 mmol/L   Chloride 104 98 - 111 mmol/L   CO2 20 (L) 22 - 32 mmol/L   Glucose, Bld 116 (H) 70 - 99 mg/dL   BUN 21 (H) 6 - 20 mg/dL   Creatinine, Ser 1.17 0.61 - 1.24 mg/dL   Calcium 10.0 8.9 - 10.3 mg/dL   GFR calc non Af Amer >60 >60 mL/min   GFR calc Af Amer >60 >60 mL/min   Anion gap 9 5 - 15    Studies/Results: No results found.    Assessment: Metastatic colon cancer. Biopsy pending and colonoscopy. Oncology consult pending.  Acute pancreatitis. Etiology unclear. Patient does not  drink alcohol. Waiting MRCP.  Plan:   As above. Continue supportive care.    Cassell Clement 07/23/2018, 9:25 AM  Pager: [phone] If no answer or after 5 PM call [phone]

## 2018-07-24 ENCOUNTER — Inpatient Hospital Stay (HOSPITAL_COMMUNITY): Payer: BLUE CROSS/BLUE SHIELD

## 2018-07-24 ENCOUNTER — Encounter (HOSPITAL_COMMUNITY): Payer: Self-pay

## 2018-07-24 DIAGNOSIS — K56609 Unspecified intestinal obstruction, unspecified as to partial versus complete obstruction: Secondary | ICD-10-CM

## 2018-07-24 LAB — HEPATIC FUNCTION PANEL
ALT: 46 U/L — AB (ref 0–44)
AST: 71 U/L — AB (ref 15–41)
Albumin: 2.6 g/dL — ABNORMAL LOW (ref 3.5–5.0)
Alkaline Phosphatase: 283 U/L — ABNORMAL HIGH (ref 38–126)
BILIRUBIN DIRECT: 0.8 mg/dL — AB (ref 0.0–0.2)
Indirect Bilirubin: 0.6 mg/dL (ref 0.3–0.9)
TOTAL PROTEIN: 6.7 g/dL (ref 6.5–8.1)
Total Bilirubin: 1.4 mg/dL — ABNORMAL HIGH (ref 0.3–1.2)

## 2018-07-24 LAB — IRON AND TIBC
Iron: 20 ug/dL — ABNORMAL LOW (ref 45–182)
SATURATION RATIOS: 11 % — AB (ref 17.9–39.5)
TIBC: 188 ug/dL — AB (ref 250–450)
UIBC: 168 ug/dL

## 2018-07-24 LAB — PROTIME-INR
INR: 1.04
PROTHROMBIN TIME: 13.5 s (ref 11.4–15.2)

## 2018-07-24 LAB — BASIC METABOLIC PANEL
Anion gap: 9 (ref 5–15)
BUN: 22 mg/dL — AB (ref 6–20)
CHLORIDE: 102 mmol/L (ref 98–111)
CO2: 24 mmol/L (ref 22–32)
Calcium: 10.2 mg/dL (ref 8.9–10.3)
Creatinine, Ser: 1.28 mg/dL — ABNORMAL HIGH (ref 0.61–1.24)
GFR calc Af Amer: >60 mL/min (ref >60)
GFR calc non Af Amer: >60 mL/min (ref >60)
Glucose, Bld: 80 mg/dL (ref 70–99)
POTASSIUM: 4.5 mmol/L (ref 3.5–5.1)
SODIUM: 135 mmol/L (ref 135–145)

## 2018-07-24 LAB — FERRITIN: Ferritin: 520 ng/mL — ABNORMAL HIGH (ref 24–336)

## 2018-07-24 LAB — PHOSPHORUS: PHOSPHORUS: 1.4 mg/dL — AB (ref 2.5–4.6)

## 2018-07-24 MED ORDER — MORPHINE SULFATE (PF) 2 MG/ML IV SOLN
2.0000 mg | INTRAVENOUS | Status: DC | PRN
  Administered 2018-07-24: 4 mg via INTRAVENOUS
  Administered 2018-07-25 (×4): 2 mg via INTRAVENOUS
  Filled 2018-07-24 (×4): qty 1
  Filled 2018-07-24: qty 2

## 2018-07-24 MED ORDER — GELATIN ABSORBABLE 12-7 MM EX MISC
CUTANEOUS | Status: AC
  Filled 2018-07-24: qty 1

## 2018-07-24 MED ORDER — LIDOCAINE HCL 1 % IJ SOLN
INTRAMUSCULAR | Status: AC
  Filled 2018-07-24: qty 20

## 2018-07-24 MED ORDER — FENTANYL CITRATE (PF) 100 MCG/2ML IJ SOLN
INTRAMUSCULAR | Status: AC | PRN
  Administered 2018-07-24 (×2): 50 ug via INTRAVENOUS

## 2018-07-24 MED ORDER — FENTANYL CITRATE (PF) 100 MCG/2ML IJ SOLN
INTRAMUSCULAR | Status: AC
  Filled 2018-07-24: qty 2

## 2018-07-24 MED ORDER — MIDAZOLAM HCL 2 MG/2ML IJ SOLN
INTRAMUSCULAR | Status: AC
  Filled 2018-07-24: qty 4

## 2018-07-24 MED ORDER — SODIUM CHLORIDE 0.9 % IV BOLUS
1000.0000 mL | Freq: Once | INTRAVENOUS | Status: AC
  Administered 2018-07-24: 1000 mL via INTRAVENOUS

## 2018-07-24 MED ORDER — PNEUMOCOCCAL 13-VAL CONJ VACC IM SUSP
0.5000 mL | INTRAMUSCULAR | Status: AC
  Administered 2018-07-25: 0.5 mL via INTRAMUSCULAR
  Filled 2018-07-24: qty 0.5

## 2018-07-24 MED ORDER — LIDOCAINE HCL (PF) 1 % IJ SOLN
INTRAMUSCULAR | Status: AC | PRN
  Administered 2018-07-24: 10 mL

## 2018-07-24 MED ORDER — ENSURE ENLIVE PO LIQD
237.0000 mL | Freq: Three times a day (TID) | ORAL | Status: DC
  Administered 2018-07-24 – 2018-07-25 (×2): 237 mL via ORAL

## 2018-07-24 MED ORDER — MIDAZOLAM HCL 2 MG/2ML IJ SOLN
INTRAMUSCULAR | Status: AC | PRN
  Administered 2018-07-24: 1 mg via INTRAVENOUS
  Administered 2018-07-24: 2 mg via INTRAVENOUS

## 2018-07-24 NOTE — Progress Notes (Signed)
Patient has been seen by oncology. Liver biopsy planned. If we can be of any further help from a GI standpoint during this hospitalization let us know, otherwise we will sign off.

## 2018-07-24 NOTE — Procedures (Signed)
Interventional Radiology Procedure Note  Procedure: US guided liver lesion biopsy  Complications: None  Estimated Blood Loss: < 10 mL  Findings: Multiple liver lesions. Left lobe lesion sampled via 17 G needle with 18 G cores x 3.  Venetia Night. Kathlene Cote, M.D Pager:  [phone]

## 2018-07-24 NOTE — Consult Note (Signed)
Chief Complaint: Patient was seen in consultation today for image guided liver lesion biopsy Chief Complaint  Patient presents with  . Abdominal Pain    Referring Physician(s): Feng,Y  Supervising Physician: Aletta Edouard  Patient Status: [facility] - In-pt  History of Present Illness: Jeffery Roman is a 52 y.o. male with history of hypertension who recently presented to [facility] with epigastric pain, intermittent nausea and vomiting.  Subsequent imaging revealed acute pancreatitis without necrosis, ascending colon wall thickening, multiple liver lesions.  Colonoscopy revealed fungating and infiltrative partially obstructing large mass in the ascending colon.  Biopsies were taken and pathology pending.  Request now received from oncology for image guided liver lesion biopsy for further evaluation.  Past Medical History:  Diagnosis Date  . Hypertension     Past Surgical History:  Procedure Laterality Date  . BIOPSY  07/22/2018   Procedure: BIOPSY;  Surgeon: Wilford Corner, MD;  Location: [facility];  Service: Endoscopy;;  . COLONOSCOPY WITH PROPOFOL N/A 07/22/2018   Procedure: COLONOSCOPY WITH PROPOFOL;  Surgeon: Wilford Corner, MD;  Location: [facility];  Service: Endoscopy;  Laterality: N/A;  . SUBMUCOSAL INJECTION  07/22/2018   Procedure: SUBMUCOSAL INJECTION;  Surgeon: Wilford Corner, MD;  Location: [facility];  Service: Endoscopy;;  tattoo    Allergies: Ciprofloxacin and Lisinopril  Medications: Prior to Admission medications   Medication Sig Start Date End Date Taking? Authorizing Provider  amLODipine (NORVASC) 5 MG tablet Take 5 mg by mouth daily. 04/30/18  Yes [provider]  hydrochlorothiazide (HYDRODIURIL) 12.5 MG tablet Take 12.5 mg by mouth daily. 04/30/18  Yes [provider]  loratadine (CLARITIN) 10 MG tablet Take 10 mg by mouth daily.   Yes [provider]  Multiple Vitamin (MULTIVITAMIN WITH MINERALS) TABS  tablet Take 1 tablet by mouth daily.   Yes [provider]  omeprazole (PRILOSEC) 20 MG capsule Take 20 mg by mouth daily.   Yes [provider]  ondansetron (ZOFRAN) 8 MG tablet Take 8 mg by mouth 2 (two) times daily as needed for nausea or vomiting.  07/09/18  Yes [provider]  Oxygen Permeable Lens Products (RENU REWETTING DROPS) SOLN Place 1 drop into both eyes daily as needed (dry eyes).   Yes [provider]  sulfamethoxazole-trimethoprim (BACTRIM DS,SEPTRA DS) 800-160 MG tablet Take 1 tablet by mouth 2 (two) times daily. 07/09/18  Yes [provider]     Family History  Problem Relation Age of Onset  . Diabetes Mellitus II Mother   . Prostate cancer Father   . Colon cancer Maternal Grandmother     Social History   Socioeconomic History  . Marital status: Married    Spouse name: Not on file  . Number of children: Not on file  . Years of education: Not on file  . Highest education level: Not on file  Occupational History  . Not on file  Social Needs  . Financial resource strain: Not on file  . Food insecurity:    Worry: Not on file    Inability: Not on file  . Transportation needs:    Medical: Not on file    Non-medical: Not on file  Tobacco Use  . Smoking status: Never Smoker  . Smokeless tobacco: Never Used  Substance and Sexual Activity  . Alcohol use: Not on file  . Drug use: Not on file  . Sexual activity: Not on file  Lifestyle  . Physical activity:    Days per week: Not  on file    Minutes per session: Not on file  . Stress: Not on file  Relationships  . Social connections:    Talks on phone: Not on file    Gets together: Not on file    Attends religious service: Not on file    Active member of club or organization: Not on file    Attends meetings of clubs or organizations: Not on file    Relationship status: Not on file  Other Topics Concern  . Not on file  Social History Narrative  . Not on file       Review of Systems denies worsening fever, headache, chest pain, back pain, abnormal bleeding.  He does have some occasional headaches, some dyspnea with exertion, occasional cough, intermittent nausea, and abdominal pain as well as constipation  Vital Signs: BP (!) 143/89 (BP Location: Left Arm)   Pulse (!) 106   Temp 99.6 F (37.6 C) (Oral)   Resp 18   Ht 5' 10.98" (1.803 m)   Wt 201 lb 15.1 oz (91.6 kg)   SpO2 93%   BMI 28.18 kg/m   Physical Exam awake, alert.  Chest with slightly diminished breath sounds bases.  Heart with slightly tachycardic but regular rhythm.  Abdomen soft, few bowel sounds, mild generalized tenderness; no lower extremity edema.  Imaging: Dg Abd 1 View - Kub  Result Date: 07/20/2018 CLINICAL DATA:  Evaluate for free peritoneal air. EXAM: ABDOMEN - 1 VIEW COMPARISON:  CT 07/18/2018 FINDINGS: Moderate air and contrast throughout the colon. No definite free peritoneal air. Remainder the exam is unremarkable. IMPRESSION: Nonspecific, nonobstructive bowel gas pattern. No definite free peritoneal air. Recommend upright KUB with diaphragms in field of view versus right lateral decubitus film for better evaluation for free air. Electronically Signed   By: Marin Olp M.D.   On: 07/20/2018 11:03   Ct Chest W Contrast  Result Date: 07/23/2018 CLINICAL DATA:  Inpatient admitted with abdominal pain, pancreatitis and weight loss. Malignant-appearing partially obstructing tumor in the ascending colon biopsied 1 day prior at colonoscopy, pathology pending. Multiple liver masses suspicious for liver metastases at CT. Chest staging. EXAM: CT CHEST WITH CONTRAST TECHNIQUE: Multidetector CT imaging of the chest was performed during intravenous contrast administration. CONTRAST:  13mL OMNIPAQUE IOHEXOL 300 MG/ML  SOLN COMPARISON:  07/18/2018 CT abdomen/pelvis. FINDINGS: Cardiovascular: Normal heart size. No significant pericardial effusion/thickening. Great vessels are normal  in course and caliber. No central pulmonary emboli. Mediastinum/Nodes: No discrete thyroid nodules. Unremarkable esophagus. No pathologically enlarged axillary, mediastinal or hilar lymph nodes. Lungs/Pleura: No pneumothorax. Small dependent bilateral pleural effusions. Mild-to-moderate compressive atelectasis in the dependent lower lobes, left greater than right. No acute consolidative airspace disease, lung masses or significant pulmonary nodules. Upper abdomen: Numerous hypodense liver masses scattered throughout the visualized liver, unchanged from recent CT. Musculoskeletal:  No aggressive appearing focal osseous lesions. IMPRESSION: 1. No findings suspicious for metastatic disease in the chest. 2. Small dependent bilateral pleural effusions with associated dependent lower lobe atelectasis. 3. Redemonstration of innumerable hypodense liver masses suspicious for liver metastases. Electronically Signed   By: Ilona Sorrel M.D.   On: 07/23/2018 13:55   Ct Abdomen Pelvis W Contrast  Addendum Date: 07/23/2018   ADDENDUM REPORT: 07/23/2018 15:19 ADDENDUM: RIGHT lower quadrant 3.2 cm solid nodule (axial 69/112) consistent with metastatic lymphadenopathy. Electronically Signed   By: Elon Alas M.D.   On: 07/23/2018 15:19   Result Date: 07/23/2018 CLINICAL DATA:  Worsening epigastric pain for a week, vomiting  today. On treatment for prostatitis. EXAM: CT ABDOMEN AND PELVIS WITH CONTRAST TECHNIQUE: Multidetector CT imaging of the abdomen and pelvis was performed using the standard protocol following bolus administration of intravenous contrast. CONTRAST:  172mL ISOVUE-300 IOPAMIDOL (ISOVUE-300) INJECTION 61% COMPARISON:  CT pelvis December 08, 2014 FINDINGS: LOWER CHEST: No pericardial effusions. Lower lobe atelectasis. Heart size is normal HEPATOBILIARY: Numerous rounded hypodensities throughout the liver measuring to 3.7 cm. Liver size is normal. Normal gallbladder. PANCREAS: Peripancreatic inflammation  without parenchymal hypodensity, ductal dilatation, abnormal calcifications or mass. SPLEEN: Normal. ADRENALS/URINARY TRACT: Kidneys are orthotopic, demonstrating symmetric enhancement. No nephrolithiasis, hydronephrosis or solid renal masses. The unopacified ureters are normal in course and caliber. Delayed imaging through the kidneys demonstrates symmetric prompt contrast excretion within the proximal urinary collecting system. Urinary bladder is partially distended and unremarkable. Normal adrenal glands. STOMACH/BOWEL: Eccentric posterior thickening of the ascending colon (axial image 61). The stomach, small bowel are normal in course and caliber without inflammatory changes. Normal appendix. VASCULAR/LYMPHATIC: Aortoiliac vessels are normal in course and caliber. No lymphadenopathy by CT size criteria. REPRODUCTIVE: Normal. OTHER: No intraperitoneal free fluid or free air. Small amount of ascites predominantly in the LEFT upper quadrant and LEFT pericolic gutter. No intraperitoneal free air or focal fluid collection. MUSCULOSKELETAL: Nonacute. Severe L5-S1 disc height loss with endplate sclerosis and vacuum disc compatible with degenerative disc resulting in severe LEFT L5-S1 neural foraminal narrowing. IMPRESSION: 1. Acute pancreatitis without necrosis. 2. Eccentric ascending colon wall thickening concerning for primary neoplasm. Multiple hepatic metastasis. Recommend colonoscopy and/or PET-CT on non emergent basis. 3. Acute findings discussed with and reconfirmed by Dr.MOLPUS on 07/18/2018 at 11:42 pm. Electronically Signed: By: Elon Alas M.D. On: 07/18/2018 23:45   Mr 3d Recon At Scanner  Result Date: 07/23/2018 CLINICAL DATA:  Pancreatic adenocarcinoma. EXAM: MRI ABDOMEN WITHOUT AND WITH CONTRAST (INCLUDING MRCP) TECHNIQUE: Multiplanar multisequence MR imaging of the abdomen was performed both before and after the administration of intravenous contrast. Heavily T2-weighted images of the biliary  and pancreatic ducts were obtained, and three-dimensional MRCP images were rendered by post processing. CONTRAST:  9 cc of Gadavist COMPARISON:  CT AP 07/18/2018 FINDINGS: Lower chest: There are small to moderate bilateral pleural effusions identified. Hepatobiliary: Innumerable peripheral enhancing lesions are identified throughout both lobes of liver compatible with metastatic disease. -index lesion within segment 2 measures 2.9 cm, image 19/3. -Index lesion within segment 4 a measures 3.9 cm, image 21/3. -Index lesion within segment 8 measures 3.8 cm, image number 20/3. -index segment 6 lesion measures 3.4 cm, image 46/3. The gallbladder appears unremarkable.  No biliary dilatation. Pancreas: Mild diffuse pancreatic edema noted. No discrete mass identified. No main duct dilatation. Spleen:  Within normal limits in size and appearance. Adrenals/Urinary Tract: Normal appearance of the adrenal glands. No kidney mass or hydronephrosis identified. Stomach/Bowel: No abnormal bowel dilatation identified. Right colon mass is noted measuring 4.3 cm. Vascular/Lymphatic: Normal appearance of the abdominal aorta. No aneurysm. Within the right lower quadrant of the abdomen there is and enlarged enhancing lymph node measuring 2.7 cm, image 35/12. Other:  None Musculoskeletal: No suspicious bone lesions identified. IMPRESSION: 1. There are innumerable peripherally enhancing liver lesions most concerning for diffuse metastatic disease. 2. Mild inflammatory changes of the pancreas compatible with pancreatitis. No complications identified. 3. Ascending colon mass concerning for primary colonic neoplasm. A soft tissue nodule is noted within the right lower quadrant of the abdomen suspicious for ileocolic mesenteric adenopathy. 4. Bilateral pleural effusions. Electronically Signed   By: Lovena Le  Clovis Riley M.D.   On: 07/23/2018 11:35   Mr Abdomen Mrcp Moise Boring Contast  Result Date: 07/23/2018 CLINICAL DATA:  Pancreatic adenocarcinoma.  EXAM: MRI ABDOMEN WITHOUT AND WITH CONTRAST (INCLUDING MRCP) TECHNIQUE: Multiplanar multisequence MR imaging of the abdomen was performed both before and after the administration of intravenous contrast. Heavily T2-weighted images of the biliary and pancreatic ducts were obtained, and three-dimensional MRCP images were rendered by post processing. CONTRAST:  9 cc of Gadavist COMPARISON:  CT AP 07/18/2018 FINDINGS: Lower chest: There are small to moderate bilateral pleural effusions identified. Hepatobiliary: Innumerable peripheral enhancing lesions are identified throughout both lobes of liver compatible with metastatic disease. -index lesion within segment 2 measures 2.9 cm, image 19/3. -Index lesion within segment 4 a measures 3.9 cm, image 21/3. -Index lesion within segment 8 measures 3.8 cm, image number 20/3. -index segment 6 lesion measures 3.4 cm, image 46/3. The gallbladder appears unremarkable.  No biliary dilatation. Pancreas: Mild diffuse pancreatic edema noted. No discrete mass identified. No main duct dilatation. Spleen:  Within normal limits in size and appearance. Adrenals/Urinary Tract: Normal appearance of the adrenal glands. No kidney mass or hydronephrosis identified. Stomach/Bowel: No abnormal bowel dilatation identified. Right colon mass is noted measuring 4.3 cm. Vascular/Lymphatic: Normal appearance of the abdominal aorta. No aneurysm. Within the right lower quadrant of the abdomen there is and enlarged enhancing lymph node measuring 2.7 cm, image 35/12. Other:  None Musculoskeletal: No suspicious bone lesions identified. IMPRESSION: 1. There are innumerable peripherally enhancing liver lesions most concerning for diffuse metastatic disease. 2. Mild inflammatory changes of the pancreas compatible with pancreatitis. No complications identified. 3. Ascending colon mass concerning for primary colonic neoplasm. A soft tissue nodule is noted within the right lower quadrant of the abdomen suspicious  for ileocolic mesenteric adenopathy. 4. Bilateral pleural effusions. Electronically Signed   By: Kerby Moors M.D.   On: 07/23/2018 11:35    Labs:  CBC: Recent Labs    07/18/18 2114 07/19/18 0543 07/20/18 1219 07/22/18 0722  WBC 14.1* 16.3* 15.6*  --   HGB 13.3 12.4* 11.0* 10.9*  HCT 41.0 38.7* 34.6* 32.0*  PLT 409* 359 291  --     COAGS: Recent Labs    07/24/18 0856  INR 1.04    BMP: Recent Labs    07/20/18 1219 07/22/18 0722 07/22/18 0952 07/23/18 0533 07/24/18 0606  NA 132* 131* 132* 133* 135  K 5.8* 4.3 4.6 5.2* 4.5  CL 104  --  100 104 102  CO2 21*  --  23 20* 24  GLUCOSE 71  --  92 116* 80  BUN 16  --  17 21* 22*  CALCIUM 10.1  --  9.9 10.0 10.2  CREATININE 1.36*  --  1.16 1.17 1.28*  GFRNONAA 60*  --  >60 >60 >60  GFRAA >60  --  >60 >60 >60    LIVER FUNCTION TESTS: Recent Labs    07/18/18 2114 07/19/18 0543 07/20/18 1219 07/24/18 0606  BILITOT 1.0 1.1 2.4* 1.4*  AST 58* 59* 69* 71*  ALT 45* 41 36 46*  ALKPHOS 335* 283* 262* 283*  PROT 8.0 7.2 6.0* 6.7  ALBUMIN 3.3* 2.9* 2.5* 2.6*    TUMOR MARKERS: No results for input(s): AFPTM, CEA, CA199, CHROMGRNA in the last 8760 hours.  Assessment and Plan: 52 y.o. male with history of hypertension who recently presented to [facility] with epigastric pain, intermittent nausea and vomiting.  Subsequent imaging revealed acute pancreatitis without necrosis, ascending colon wall  thickening, multiple liver lesions.  Colonoscopy revealed fungating and infiltrative partially obstructing large mass in the ascending colon.  Biopsies were taken and pathology pending.  Request now received from oncology for image guided liver lesion biopsy for further evaluation.  Imaging studies have been reviewed by Dr. Kathlene Cote.Risks and benefits discussed with the patient/spouse including, but not limited to bleeding, infection, damage to adjacent structures or low yield requiring additional tests.  All of the  patient's questions were answered, patient is agreeable to proceed. Consent signed and in chart.  Procedure tentatively planned for later today.    Thank you for this interesting consult.  I greatly enjoyed meeting TROYCE GIESKE and look forward to participating in their care.  A copy of this report was sent to the requesting provider on this date.  Electronically Signed: D. Rowe Robert, PA-C 07/24/2018, 10:11 AM   I spent a total of 30 minutes   in face to face in clinical consultation, greater than 50% of which was counseling/coordinating care for image guided liver lesion biopsy

## 2018-07-24 NOTE — Progress Notes (Signed)
Jeffery Roman   DOB:[birth year]   EX#:[id number]   LFY#:[id number]  Oncology follow up   Subjective: Pt underwent liver biopsy by IR today, no issues of the biopsy.  He will start liquid diet, no other new complains.    Objective:  Vitals:   07/24/18 1505 07/24/18 2021  BP: (!) 144/83 (!) 156/92  Pulse: (!) 102 (!) 108  Resp: (!) 21 20  Temp:  99.3 F (37.4 C)  SpO2: 100% 93%    Body mass index is 28.18 kg/m.  Intake/Output Summary (Last 24 hours) at 07/24/2018 2117 Last data filed at 07/24/2018 1800 Gross per 24 hour  Intake 960 ml  Output -  Net 960 ml     Sclerae unicteric  Oropharynx clear  No peripheral adenopathy  Lungs clear -- no rales or rhonchi  Heart regular rate and rhythm  Abdomen benign  MSK no focal spinal tenderness, no peripheral edema  Neuro nonfocal   CBG (last 3)  No results for input(s): GLUCAP in the last 72 hours.   Labs:  Lab Results  Component Value Date   WBC 15.6 (H) 07/20/2018   HGB 10.9 (L) 07/22/2018   HCT 32.0 (L) 07/22/2018   MCV 87.8 07/20/2018   PLT 291 07/20/2018   NEUTROABS 13.7 (H) 07/19/2018   CMP Latest Ref Rng & Units 07/24/2018 07/23/2018 07/22/2018  Glucose 70 - 99 mg/dL 80 116(H) 92  BUN 6 - 20 mg/dL 22(H) 21(H) 17  Creatinine 0.61 - 1.24 mg/dL 1.28(H) 1.17 1.16  Sodium 135 - 145 mmol/L 135 133(L) 132(L)  Potassium 3.5 - 5.1 mmol/L 4.5 5.2(H) 4.6  Chloride 98 - 111 mmol/L 102 104 100  CO2 22 - 32 mmol/L 24 20(L) 23  Calcium 8.9 - 10.3 mg/dL 10.2 10.0 9.9  Total Protein 6.5 - 8.1 g/dL 6.7 - -  Total Bilirubin 0.3 - 1.2 mg/dL 1.4(H) - -  Alkaline Phos 38 - 126 U/L 283(H) - -  AST 15 - 41 U/L 71(H) - -  ALT 0 - 44 U/L 46(H) - -     Urine Studies No results for input(s): UHGB, CRYS in the last 72 hours.  Invalid input(s): UACOL, UAPR, USPG, UPH, UTP, UGL, UKET, UBIL, UNIT, UROB, [place], UEPI, UWBC, [name] [place], [place], [place]  Basic Metabolic Panel: Recent Labs  Lab 07/19/18 0543 07/19/18 1449  07/20/18 1219 07/22/18 0722 07/22/18 0952 07/23/18 0533 07/24/18 0606  NA 129* 130* 132* 131* 132* 133* 135  K 6.0* 5.6* 5.8* 4.3 4.6 5.2* 4.5  CL 99 102 104  --  100 104 102  CO2 21* 22 21*  --  23 20* 24  GLUCOSE 96 89 71  --  92 116* 80  BUN _0 --  17 21* 22*  CREATININE 1.63* 1.54* 1.36*  --  1.16 1.17 1.28*  CALCIUM 11.5* 10.6* 10.1  --  9.9 10.0 10.2  MG 1.9  --   --   --   --   --   --   PHOS  --   --   --   --   --   --  1.4*   GFR Estimated Creatinine Clearance: 80.8 mL/min (A) (by C-G formula based on SCr of 1.28 mg/dL (H)). Liver Function Tests: Recent Labs  Lab 07/18/18 2114 07/19/18 0543 07/20/18 1219 07/24/18 0606  AST 58* 59* 69* 71*  ALT 45* 41 36 46*  ALKPHOS 335* 283* 262* 283*  BILITOT 1.0 1.1 2.4* 1.4*  PROT  8.0 7.2 6.0* 6.7  ALBUMIN 3.3* 2.9* 2.5* 2.6*   Recent Labs  Lab 07/18/18 2114 07/20/18 1219  LIPASE 1,104* 214*   No results for input(s): AMMONIA in the last 168 hours. Coagulation profile Recent Labs  Lab 07/24/18 0856  INR 1.04    CBC: Recent Labs  Lab 07/18/18 2114 07/19/18 0543 07/20/18 1219 07/22/18 0722  WBC 14.1* 16.3* 15.6*  --   NEUTROABS  --  13.7*  --   --   HGB 13.3 12.4* 11.0* 10.9*  HCT 41.0 38.7* 34.6* 32.0*  MCV 84.7 86.4 87.8  --   PLT 409* 359 291  --    Cardiac Enzymes: Recent Labs  Lab 07/19/18 0543  TROPONINI <0.03   BNP: Invalid input(s): POCBNP CBG: No results for input(s): GLUCAP in the last 168 hours. D-Dimer No results for input(s): DDIMER in the last 72 hours. Hgb A1c No results for input(s): HGBA1C in the last 72 hours. Lipid Profile No results for input(s): CHOL, HDL, LDLCALC, TRIG, CHOLHDL, LDLDIRECT in the last 72 hours. Thyroid function studies No results for input(s): TSH, T4TOTAL, T3FREE, THYROIDAB in the last 72 hours.  Invalid input(s): FREET3 Anemia work up Recent Labs    07/24/18 0606  FERRITIN 520*  TIBC 188*  IRON 20*   Microbiology No results found for  this or any previous visit (from the past 240 hour(s)).    Studies:  Ct Chest W Contrast  Result Date: 07/23/2018 CLINICAL DATA:  Inpatient admitted with abdominal pain, pancreatitis and weight loss. Malignant-appearing partially obstructing tumor in the ascending colon biopsied 1 day prior at colonoscopy, pathology pending. Multiple liver masses suspicious for liver metastases at CT. Chest staging. EXAM: CT CHEST WITH CONTRAST TECHNIQUE: Multidetector CT imaging of the chest was performed during intravenous contrast administration. CONTRAST:  51m OMNIPAQUE IOHEXOL 300 MG/ML  SOLN COMPARISON:  07/18/2018 CT abdomen/pelvis. FINDINGS: Cardiovascular: Normal heart size. No significant pericardial effusion/thickening. Great vessels are normal in course and caliber. No central pulmonary emboli. Mediastinum/Nodes: No discrete thyroid nodules. Unremarkable esophagus. No pathologically enlarged axillary, mediastinal or hilar lymph nodes. Lungs/Pleura: No pneumothorax. Small dependent bilateral pleural effusions. Mild-to-moderate compressive atelectasis in the dependent lower lobes, left greater than right. No acute consolidative airspace disease, lung masses or significant pulmonary nodules. Upper abdomen: Numerous hypodense liver masses scattered throughout the visualized liver, unchanged from recent CT. Musculoskeletal:  No aggressive appearing focal osseous lesions. IMPRESSION: 1. No findings suspicious for metastatic disease in the chest. 2. Small dependent bilateral pleural effusions with associated dependent lower lobe atelectasis. 3. Redemonstration of innumerable hypodense liver masses suspicious for liver metastases. Electronically Signed   By: JIlona SorrelM.D.   On: 07/23/2018 13:55   Mr 3d Recon At Scanner  Result Date: 07/23/2018 CLINICAL DATA:  Pancreatic adenocarcinoma. EXAM: MRI ABDOMEN WITHOUT AND WITH CONTRAST (INCLUDING MRCP) TECHNIQUE: Multiplanar multisequence MR imaging of the abdomen was  performed both before and after the administration of intravenous contrast. Heavily T2-weighted images of the biliary and pancreatic ducts were obtained, and three-dimensional MRCP images were rendered by post processing. CONTRAST:  9 cc of Gadavist COMPARISON:  CT AP 07/18/2018 FINDINGS: Lower chest: There are small to moderate bilateral pleural effusions identified. Hepatobiliary: Innumerable peripheral enhancing lesions are identified throughout both lobes of liver compatible with metastatic disease. -index lesion within segment 2 measures 2.9 cm, image 19/3. -Index lesion within segment 4 a measures 3.9 cm, image 21/3. -Index lesion within segment 8 measures 3.8 cm, image number 20/3. -index segment 6  lesion measures 3.4 cm, image 46/3. The gallbladder appears unremarkable.  No biliary dilatation. Pancreas: Mild diffuse pancreatic edema noted. No discrete mass identified. No main duct dilatation. Spleen:  Within normal limits in size and appearance. Adrenals/Urinary Tract: Normal appearance of the adrenal glands. No kidney mass or hydronephrosis identified. Stomach/Bowel: No abnormal bowel dilatation identified. Right colon mass is noted measuring 4.3 cm. Vascular/Lymphatic: Normal appearance of the abdominal aorta. No aneurysm. Within the right lower quadrant of the abdomen there is and enlarged enhancing lymph node measuring 2.7 cm, image 35/12. Other:  None Musculoskeletal: No suspicious bone lesions identified. IMPRESSION: 1. There are innumerable peripherally enhancing liver lesions most concerning for diffuse metastatic disease. 2. Mild inflammatory changes of the pancreas compatible with pancreatitis. No complications identified. 3. Ascending colon mass concerning for primary colonic neoplasm. A soft tissue nodule is noted within the right lower quadrant of the abdomen suspicious for ileocolic mesenteric adenopathy. 4. Bilateral pleural effusions. Electronically Signed   By: Kerby Moors M.D.   On:  07/23/2018 11:35   US Biopsy (liver)  Result Date: 07/24/2018 CLINICAL DATA:  Innumerable masses throughout the liver and ascending colonic mass. EXAM: ULTRASOUND GUIDED CORE BIOPSY OF LIVER MEDICATIONS: 3.0 mg IV Versed; 100 mcg IV Fentanyl Total Moderate Sedation Time: 10 minutes. The patient's level of consciousness and physiologic status were continuously monitored during the procedure by Radiology nursing. PROCEDURE: The procedure, risks, benefits, and alternatives were explained to the patient. Questions regarding the procedure were encouraged and answered. The patient understands and consents to the procedure. A time out was performed prior to initiating the procedure. Ultrasound was performed to localize liver lesions. The abdominal wall was prepped with chlorhexidine in a sterile fashion, and a sterile drape was applied covering the operative field. A sterile gown and sterile gloves were used for the procedure. Local anesthesia was provided with 1% Lidocaine. A 17 gauge needle was advanced into the left lobe. Three separate 18 gauge coaxial core biopsy samples were obtained and submitted in formalin. Gel-Foam pledgets were advanced through the outer needle as the needle was retracted. Additional ultrasound was performed. COMPLICATIONS: None. FINDINGS: Multiple masses are seen throughout the liver parenchyma. An area in the left lobe was chosen for biopsy of confluent masses. Solid tissue was obtained. IMPRESSION: Ultrasound-guided core biopsy performed at the level of masses within the left lobe of the liver. Electronically Signed   By: Aletta Edouard M.D.   On: 07/24/2018 15:30   Mr Abdomen Mrcp W GY Contast  Result Date: 07/23/2018 CLINICAL DATA:  Pancreatic adenocarcinoma. EXAM: MRI ABDOMEN WITHOUT AND WITH CONTRAST (INCLUDING MRCP) TECHNIQUE: Multiplanar multisequence MR imaging of the abdomen was performed both before and after the administration of intravenous contrast. Heavily T2-weighted  images of the biliary and pancreatic ducts were obtained, and three-dimensional MRCP images were rendered by post processing. CONTRAST:  9 cc of Gadavist COMPARISON:  CT AP 07/18/2018 FINDINGS: Lower chest: There are small to moderate bilateral pleural effusions identified. Hepatobiliary: Innumerable peripheral enhancing lesions are identified throughout both lobes of liver compatible with metastatic disease. -index lesion within segment 2 measures 2.9 cm, image 19/3. -Index lesion within segment 4 a measures 3.9 cm, image 21/3. -Index lesion within segment 8 measures 3.8 cm, image number 20/3. -index segment 6 lesion measures 3.4 cm, image 46/3. The gallbladder appears unremarkable.  No biliary dilatation. Pancreas: Mild diffuse pancreatic edema noted. No discrete mass identified. No main duct dilatation. Spleen:  Within normal limits in size and appearance. Adrenals/Urinary  Tract: Normal appearance of the adrenal glands. No kidney mass or hydronephrosis identified. Stomach/Bowel: No abnormal bowel dilatation identified. Right colon mass is noted measuring 4.3 cm. Vascular/Lymphatic: Normal appearance of the abdominal aorta. No aneurysm. Within the right lower quadrant of the abdomen there is and enlarged enhancing lymph node measuring 2.7 cm, image 35/12. Other:  None Musculoskeletal: No suspicious bone lesions identified. IMPRESSION: 1. There are innumerable peripherally enhancing liver lesions most concerning for diffuse metastatic disease. 2. Mild inflammatory changes of the pancreas compatible with pancreatitis. No complications identified. 3. Ascending colon mass concerning for primary colonic neoplasm. A soft tissue nodule is noted within the right lower quadrant of the abdomen suspicious for ileocolic mesenteric adenopathy. 4. Bilateral pleural effusions. Electronically Signed   By: Kerby Moors M.D.   On: 07/23/2018 11:35    Assessment: 52 y.o. male with history of HTN, presented with 3 weeks history  of epigastric pain, intermittent nausea and vomiting  1. 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  Transaminitis and hyperbilirubinemia 3.  Pancreatitis  4. AKI improving  5.  Small bilateral pleural effusion  Plan:  -I discussed his pathology results from the colon mass biopsy, which showed adenocarcinoma.  I have requested an MMR and Wells Guiles one molecular testing on his biopsy to see if he is a candidate for immunotherapy or targeted therapy -will discuss with GI about his diet, if we can advance, since his pancreatitis is also improving, will repeat lipase tomorrow -plan to schedule his port as outpt, and start chemo next week  -he meet our GI navigator Dawn today    Truitt Merle, MD 07/24/2018

## 2018-07-24 NOTE — Progress Notes (Signed)
  Oncology Nurse Navigator Documentation  Navigator Location: [facility] (07/24/18 1731) Referral date to RadOnc/MedOnc: 07/23/18 (07/24/18 1731) )Navigator Encounter Type: Other(4th floor in-patient at [facility] (07/24/18 1731)  Met with patient and his wife on inpatient 4th floor. I introduced myself and the role of navigator and described all members of patient's treatment team. Briefly reviewed chemo regimen with patient. They understand that they will meet with chemo education RN. Patient and wife concerned about how they will tell their 37 and 80 year old kids. Referral to social work made. I will continue to support patient and follow as needed. Patient has my direct contact information for questions or concerns.. Abnormal Finding Date: 07/18/18 (07/24/18 1731) Confirmed Diagnosis Date: 07/24/18 (07/24/18 1731)               Patient Visit Type: Inpatient (07/24/18 1731)   Barriers/Navigation Needs: Family concerns;Coordination of Care (07/24/18 1731)   Interventions: Psycho-social support;Other (07/24/18 1731)        Support Groups/Services: Friends and Family;Other(Referral to Social Work) (07/24/18 1731)   Acuity: Level 3 (07/24/18 1731)     Acuity Level 3: Ongoing guidance and education provided throughout treatment;Other(Chemo education) (07/24/18 1731)   Time Spent with Patient: 60 (07/24/18 1731)

## 2018-07-24 NOTE — Progress Notes (Signed)
[name] PROGRESS NOTE    Progress Note  Jeffery Roman  ZTI:[id number] DOB: [birth year] DOA: 07/18/2018 PCP: Alroy Dust, L.Marlou Sa, MD     Brief Narrative:   Jeffery Roman is an 52 y.o. male past medical history of hypertension comes in complaining of abdominal pain, she recently seen her PCP and was started on.  Antibiotics no improvement came into the ER was found to be in acute renal failure with mild pancreatitis calcium of 12.6 bilirubin of 1, CT scan of the abdomen pelvis showed pancreatitis with a colonic mass with multiple metastatic lesions to the liver.  Assessment/Plan:   Acute pancreatitis: It appears to be resolved. He relates he is tolerating his liquid diet and and he has remained pain-free. We will advance to full liquid diet.  Acute kidney injury: Likely prerenal azotemia resolved with IV fluid hydration.  Hyperkalemia: Resolved with veltassa.  Colonic neoplasm with metastatic liver disease: Hemoglobin this morning is 10.9 we will continue to trend hemoglobin. GI was consulted who recommended a colonoscopy done on 07/22/2018 that showed a partially obstructed mass in the ascending colon and concern for adenocarcinoma. MRCP show diffuse mets, ONcology rec IR to perform liver biopsy.  Hypercalcemia Resolved with IV fluids   Essential hypertension Hold antihypertensive medication.   DVT prophylaxis: SCD Family Communication:None Disposition Plan/Barrier to D/C: unable to determine Code Status:     Code Status Orders  (From admission, onward)         Start     Ordered   07/19/18 0316  Full code  Continuous     07/19/18 0317        Code Status History    This patient has a current code status but no historical code status.        IV Access:    Peripheral IV   Procedures and diagnostic studies:   Ct Chest W Contrast  Result Date: 07/23/2018 CLINICAL DATA:  Inpatient admitted with abdominal pain, pancreatitis and weight loss.  Malignant-appearing partially obstructing tumor in the ascending colon biopsied 1 day prior at colonoscopy, pathology pending. Multiple liver masses suspicious for liver metastases at CT. Chest staging. EXAM: CT CHEST WITH CONTRAST TECHNIQUE: Multidetector CT imaging of the chest was performed during intravenous contrast administration. CONTRAST:  66mL OMNIPAQUE IOHEXOL 300 MG/ML  SOLN COMPARISON:  07/18/2018 CT abdomen/pelvis. FINDINGS: Cardiovascular: Normal heart size. No significant pericardial effusion/thickening. Great vessels are normal in course and caliber. No central pulmonary emboli. Mediastinum/Nodes: No discrete thyroid nodules. Unremarkable esophagus. No pathologically enlarged axillary, mediastinal or hilar lymph nodes. Lungs/Pleura: No pneumothorax. Small dependent bilateral pleural effusions. Mild-to-moderate compressive atelectasis in the dependent lower lobes, left greater than right. No acute consolidative airspace disease, lung masses or significant pulmonary nodules. Upper abdomen: Numerous hypodense liver masses scattered throughout the visualized liver, unchanged from recent CT. Musculoskeletal:  No aggressive appearing focal osseous lesions. IMPRESSION: 1. No findings suspicious for metastatic disease in the chest. 2. Small dependent bilateral pleural effusions with associated dependent lower lobe atelectasis. 3. Redemonstration of innumerable hypodense liver masses suspicious for liver metastases. Electronically Signed   By: Ilona Sorrel M.D.   On: 07/23/2018 13:55   Mr 3d Recon At Scanner  Result Date: 07/23/2018 CLINICAL DATA:  Pancreatic adenocarcinoma. EXAM: MRI ABDOMEN WITHOUT AND WITH CONTRAST (INCLUDING MRCP) TECHNIQUE: Multiplanar multisequence MR imaging of the abdomen was performed both before and after the administration of intravenous contrast. Heavily T2-weighted images of the biliary and pancreatic ducts were obtained, and three-dimensional MRCP images were  rendered by  post processing. CONTRAST:  9 cc of Gadavist COMPARISON:  CT AP 07/18/2018 FINDINGS: Lower chest: There are small to moderate bilateral pleural effusions identified. Hepatobiliary: Innumerable peripheral enhancing lesions are identified throughout both lobes of liver compatible with metastatic disease. -index lesion within segment 2 measures 2.9 cm, image 19/3. -Index lesion within segment 4 a measures 3.9 cm, image 21/3. -Index lesion within segment 8 measures 3.8 cm, image number 20/3. -index segment 6 lesion measures 3.4 cm, image 46/3. The gallbladder appears unremarkable.  No biliary dilatation. Pancreas: Mild diffuse pancreatic edema noted. No discrete mass identified. No main duct dilatation. Spleen:  Within normal limits in size and appearance. Adrenals/Urinary Tract: Normal appearance of the adrenal glands. No kidney mass or hydronephrosis identified. Stomach/Bowel: No abnormal bowel dilatation identified. Right colon mass is noted measuring 4.3 cm. Vascular/Lymphatic: Normal appearance of the abdominal aorta. No aneurysm. Within the right lower quadrant of the abdomen there is and enlarged enhancing lymph node measuring 2.7 cm, image 35/12. Other:  None Musculoskeletal: No suspicious bone lesions identified. IMPRESSION: 1. There are innumerable peripherally enhancing liver lesions most concerning for diffuse metastatic disease. 2. Mild inflammatory changes of the pancreas compatible with pancreatitis. No complications identified. 3. Ascending colon mass concerning for primary colonic neoplasm. A soft tissue nodule is noted within the right lower quadrant of the abdomen suspicious for ileocolic mesenteric adenopathy. 4. Bilateral pleural effusions. Electronically Signed   By: Kerby Moors M.D.   On: 07/23/2018 11:35   Mr Abdomen Mrcp Moise Boring Contast  Result Date: 07/23/2018 CLINICAL DATA:  Pancreatic adenocarcinoma. EXAM: MRI ABDOMEN WITHOUT AND WITH CONTRAST (INCLUDING MRCP) TECHNIQUE: Multiplanar  multisequence MR imaging of the abdomen was performed both before and after the administration of intravenous contrast. Heavily T2-weighted images of the biliary and pancreatic ducts were obtained, and three-dimensional MRCP images were rendered by post processing. CONTRAST:  9 cc of Gadavist COMPARISON:  CT AP 07/18/2018 FINDINGS: Lower chest: There are small to moderate bilateral pleural effusions identified. Hepatobiliary: Innumerable peripheral enhancing lesions are identified throughout both lobes of liver compatible with metastatic disease. -index lesion within segment 2 measures 2.9 cm, image 19/3. -Index lesion within segment 4 a measures 3.9 cm, image 21/3. -Index lesion within segment 8 measures 3.8 cm, image number 20/3. -index segment 6 lesion measures 3.4 cm, image 46/3. The gallbladder appears unremarkable.  No biliary dilatation. Pancreas: Mild diffuse pancreatic edema noted. No discrete mass identified. No main duct dilatation. Spleen:  Within normal limits in size and appearance. Adrenals/Urinary Tract: Normal appearance of the adrenal glands. No kidney mass or hydronephrosis identified. Stomach/Bowel: No abnormal bowel dilatation identified. Right colon mass is noted measuring 4.3 cm. Vascular/Lymphatic: Normal appearance of the abdominal aorta. No aneurysm. Within the right lower quadrant of the abdomen there is and enlarged enhancing lymph node measuring 2.7 cm, image 35/12. Other:  None Musculoskeletal: No suspicious bone lesions identified. IMPRESSION: 1. There are innumerable peripherally enhancing liver lesions most concerning for diffuse metastatic disease. 2. Mild inflammatory changes of the pancreas compatible with pancreatitis. No complications identified. 3. Ascending colon mass concerning for primary colonic neoplasm. A soft tissue nodule is noted within the right lower quadrant of the abdomen suspicious for ileocolic mesenteric adenopathy. 4. Bilateral pleural effusions. Electronically  Signed   By: Kerby Moors M.D.   On: 07/23/2018 11:35     Medical Consultants:    None.  Anti-Infectives:   None  Subjective:    [name] cont full liq  diet Objective:    Vitals:   07/23/18 0618 07/23/18 1347 07/23/18 2114 07/24/18 0534  BP: 136/82 126/86 140/86 (!) 143/89  Pulse: 76 84 89 (!) 106  Resp: 16 20 18 18   Temp: (!) 97.5 F (36.4 C) 97.8 F (36.6 C) 98.2 F (36.8 C) 99.6 F (37.6 C)  TempSrc: Oral Oral Oral Oral  SpO2: 95% 96% 98% 93%  Weight: 91.6 kg     Height:        Intake/Output Summary (Last 24 hours) at 07/24/2018 1101 Last data filed at 07/23/2018 1405 Gross per 24 hour  Intake 240 ml  Output -  Net 240 ml   Filed Weights   07/19/18 0237 07/20/18 0522 07/23/18 0618  Weight: 88.6 kg 91.5 kg 91.6 kg    Exam: General exam: In no acute distress. Respiratory system: Good air movement and clear to auscultation. Cardiovascular system: S1 & S2 heard, RRR. Gastrointestinal system: Abdomen is nondistended, soft and nontender.  Central nervous system: Alert and oriented. No focal neurological deficits. Extremities: No pedal edema. Skin: No rashes, lesions or ulcers Psychiatry: Judgement and insight appear normal. Mood & affect appropriate.    Data Reviewed:    Labs: Basic Metabolic Panel: Recent Labs  Lab 07/19/18 0543 07/19/18 1449 07/20/18 1219 07/22/18 0722 07/22/18 0952 07/23/18 0533 07/24/18 0606  NA 129* 130* 132* 131* 132* 133* 135  K 6.0* 5.6* 5.8* 4.3 4.6 5.2* 4.5  CL 99 102 104  --  100 104 102  CO2 21* 22 21*  --  23 20* 24  GLUCOSE 96 89 71  --  92 116* 80  BUN 17 17 16   --  17 21* 22*  CREATININE 1.63* 1.54* 1.36*  --  1.16 1.17 1.28*  CALCIUM 11.5* 10.6* 10.1  --  9.9 10.0 10.2  MG 1.9  --   --   --   --   --   --   PHOS  --   --   --   --   --   --  1.4*   GFR Estimated Creatinine Clearance: 80.8 mL/min (A) (by C-G formula based on SCr of 1.28 mg/dL (H)). Liver Function Tests: Recent Labs  Lab  07/18/18 2114 07/19/18 0543 07/20/18 1219 07/24/18 0606  AST 58* 59* 69* 71*  ALT 45* 41 36 46*  ALKPHOS 335* 283* 262* 283*  BILITOT 1.0 1.1 2.4* 1.4*  PROT 8.0 7.2 6.0* 6.7  ALBUMIN 3.3* 2.9* 2.5* 2.6*   Recent Labs  Lab 07/18/18 2114 07/20/18 1219  LIPASE 1,104* 214*   No results for input(s): AMMONIA in the last 168 hours. Coagulation profile Recent Labs  Lab 07/24/18 0856  INR 1.04    CBC: Recent Labs  Lab 07/18/18 2114 07/19/18 0543 07/20/18 1219 07/22/18 0722  WBC 14.1* 16.3* 15.6*  --   NEUTROABS  --  13.7*  --   --   HGB 13.3 12.4* 11.0* 10.9*  HCT 41.0 38.7* 34.6* 32.0*  MCV 84.7 86.4 87.8  --   PLT 409* 359 291  --    Cardiac Enzymes: Recent Labs  Lab 07/19/18 0543  TROPONINI <0.03   BNP (last 3 results) No results for input(s): PROBNP in the last 8760 hours. CBG: No results for input(s): GLUCAP in the last 168 hours. D-Dimer: No results for input(s): DDIMER in the last 72 hours. Hgb A1c: No results for input(s): HGBA1C in the last 72 hours. Lipid Profile: No results for input(s): CHOL, HDL, LDLCALC, TRIG, CHOLHDL, LDLDIRECT  in the last 72 hours. Thyroid function studies: No results for input(s): TSH, T4TOTAL, T3FREE, THYROIDAB in the last 72 hours.  Invalid input(s): FREET3 Anemia work up: Recent Labs    07/24/18 0606  FERRITIN 520*  TIBC 188*  IRON 20*   Sepsis Labs: Recent Labs  Lab 07/18/18 2114 07/19/18 0543 07/20/18 1219  WBC 14.1* 16.3* 15.6*   Microbiology No results found for this or any previous visit (from the past 240 hour(s)).   Medications:   . bisacodyl  10 mg Rectal Daily  . feeding supplement (ENSURE ENLIVE)  237 mL Oral TID BM  . multivitamin with minerals  1 tablet Oral Daily  . polyethylene glycol  17 g Oral Daily  . sodium polystyrene  15 g Oral Once   Continuous Infusions: . sodium chloride 10 mL/hr at 07/21/18 1530  . sodium chloride       LOS: 5 days   Charlynne Cousins  [facility] Pager [phone]  *Please refer to [email or web address], password [email or web address] to get updated schedule on who will round on this patient, as hospitalists switch teams weekly. If 7PM-7AM, please contact night-coverage at [email or web address], password [email or web address] for any overnight needs.  07/24/2018, 11:01 AM

## 2018-07-24 NOTE — Progress Notes (Addendum)
[facility] Progress Note  2 Days Post-Op  Subjective: CC-  No new complaints. Continues to have some upper abdominal pain, worse with PO intake. Denies n/v. Passing flatus. Last BM 10/27. NPO for procedure today, but was tolerating liquids yesterday. Going for liver biopsy in IR today.  Objective: Vital signs in last 24 hours: Temp:  [97.8 F (36.6 C)-99.6 F (37.6 C)] 99.6 F (37.6 C) (10/29 0534) Pulse Rate:  [84-106] 106 (10/29 0534) Resp:  [18-20] 18 (10/29 0534) BP: (126-143)/(86-89) 143/89 (10/29 0534) SpO2:  [93 %-98 %] 93 % (10/29 0534) Last BM Date: 07/23/18  Intake/Output from previous day: 10/28 0701 - 10/29 0700 In: 240 [P.O.:240] Out: -  Intake/Output this shift: No intake/output data recorded.  PE: Gen:  Alert, NAD, pleasant HEENT: EOM's intact, pupils equal and round Card:  RRR Pulm:  CTAB, no W/R/R, effort normal Abd: Soft, mild distension, mild epigastric/LUQ TTP without rebound or guarding, +BS, no HSM Psych: A&Ox3  Skin: no rashes noted, warm and dry  Lab Results:  Recent Labs    07/22/18 0722  HGB 10.9*  HCT 32.0*   BMET Recent Labs    07/23/18 0533 07/24/18 0606  NA 133* 135  K 5.2* 4.5  CL 104 102  CO2 20* 24  GLUCOSE 116* 80  BUN 21* 22*  CREATININE 1.17 1.28*  CALCIUM 10.0 10.2   PT/INR Recent Labs    07/24/18 0856  LABPROT 13.5  INR 1.04   CMP     Component Value Date/Time   NA 135 07/24/2018 0606   K 4.5 07/24/2018 0606   CL 102 07/24/2018 0606   CO2 24 07/24/2018 0606   GLUCOSE 80 07/24/2018 0606   BUN 22 (H) 07/24/2018 0606   CREATININE 1.28 (H) 07/24/2018 0606   CALCIUM 10.2 07/24/2018 0606   PROT 6.7 07/24/2018 0606   ALBUMIN 2.6 (L) 07/24/2018 0606   AST 71 (H) 07/24/2018 0606   ALT 46 (H) 07/24/2018 0606   ALKPHOS 283 (H) 07/24/2018 0606   BILITOT 1.4 (H) 07/24/2018 0606   GFRNONAA >60 07/24/2018 0606   GFRAA >60 07/24/2018 0606   Lipase     Component Value Date/Time   LIPASE 214  (H) 07/20/2018 1219       Studies/Results: Ct Chest W Contrast  Result Date: 07/23/2018 CLINICAL DATA:  Inpatient admitted with abdominal pain, pancreatitis and weight loss. Malignant-appearing partially obstructing tumor in the ascending colon biopsied 1 day prior at colonoscopy, pathology pending. Multiple liver masses suspicious for liver metastases at CT. Chest staging. EXAM: CT CHEST WITH CONTRAST TECHNIQUE: Multidetector CT imaging of the chest was performed during intravenous contrast administration. CONTRAST:  85mL OMNIPAQUE IOHEXOL 300 MG/ML  SOLN COMPARISON:  07/18/2018 CT abdomen/pelvis. FINDINGS: Cardiovascular: Normal heart size. No significant pericardial effusion/thickening. Great vessels are normal in course and caliber. No central pulmonary emboli. Mediastinum/Nodes: No discrete thyroid nodules. Unremarkable esophagus. No pathologically enlarged axillary, mediastinal or hilar lymph nodes. Lungs/Pleura: No pneumothorax. Small dependent bilateral pleural effusions. Mild-to-moderate compressive atelectasis in the dependent lower lobes, left greater than right. No acute consolidative airspace disease, lung masses or significant pulmonary nodules. Upper abdomen: Numerous hypodense liver masses scattered throughout the visualized liver, unchanged from recent CT. Musculoskeletal:  No aggressive appearing focal osseous lesions. IMPRESSION: 1. No findings suspicious for metastatic disease in the chest. 2. Small dependent bilateral pleural effusions with associated dependent lower lobe atelectasis. 3. Redemonstration of innumerable hypodense liver masses suspicious for liver metastases. Electronically Signed   By: Corene Cornea  A Poff M.D.   On: 07/23/2018 13:55   Mr 3d Recon At Scanner  Result Date: 07/23/2018 CLINICAL DATA:  Pancreatic adenocarcinoma. EXAM: MRI ABDOMEN WITHOUT AND WITH CONTRAST (INCLUDING MRCP) TECHNIQUE: Multiplanar multisequence MR imaging of the abdomen was performed both before  and after the administration of intravenous contrast. Heavily T2-weighted images of the biliary and pancreatic ducts were obtained, and three-dimensional MRCP images were rendered by post processing. CONTRAST:  9 cc of Gadavist COMPARISON:  CT AP 07/18/2018 FINDINGS: Lower chest: There are small to moderate bilateral pleural effusions identified. Hepatobiliary: Innumerable peripheral enhancing lesions are identified throughout both lobes of liver compatible with metastatic disease. -index lesion within segment 2 measures 2.9 cm, image 19/3. -Index lesion within segment 4 a measures 3.9 cm, image 21/3. -Index lesion within segment 8 measures 3.8 cm, image number 20/3. -index segment 6 lesion measures 3.4 cm, image 46/3. The gallbladder appears unremarkable.  No biliary dilatation. Pancreas: Mild diffuse pancreatic edema noted. No discrete mass identified. No main duct dilatation. Spleen:  Within normal limits in size and appearance. Adrenals/Urinary Tract: Normal appearance of the adrenal glands. No kidney mass or hydronephrosis identified. Stomach/Bowel: No abnormal bowel dilatation identified. Right colon mass is noted measuring 4.3 cm. Vascular/Lymphatic: Normal appearance of the abdominal aorta. No aneurysm. Within the right lower quadrant of the abdomen there is and enlarged enhancing lymph node measuring 2.7 cm, image 35/12. Other:  None Musculoskeletal: No suspicious bone lesions identified. IMPRESSION: 1. There are innumerable peripherally enhancing liver lesions most concerning for diffuse metastatic disease. 2. Mild inflammatory changes of the pancreas compatible with pancreatitis. No complications identified. 3. Ascending colon mass concerning for primary colonic neoplasm. A soft tissue nodule is noted within the right lower quadrant of the abdomen suspicious for ileocolic mesenteric adenopathy. 4. Bilateral pleural effusions. Electronically Signed   By: Kerby Moors M.D.   On: 07/23/2018 11:35   Mr  Abdomen Mrcp Moise Boring Contast  Result Date: 07/23/2018 CLINICAL DATA:  Pancreatic adenocarcinoma. EXAM: MRI ABDOMEN WITHOUT AND WITH CONTRAST (INCLUDING MRCP) TECHNIQUE: Multiplanar multisequence MR imaging of the abdomen was performed both before and after the administration of intravenous contrast. Heavily T2-weighted images of the biliary and pancreatic ducts were obtained, and three-dimensional MRCP images were rendered by post processing. CONTRAST:  9 cc of Gadavist COMPARISON:  CT AP 07/18/2018 FINDINGS: Lower chest: There are small to moderate bilateral pleural effusions identified. Hepatobiliary: Innumerable peripheral enhancing lesions are identified throughout both lobes of liver compatible with metastatic disease. -index lesion within segment 2 measures 2.9 cm, image 19/3. -Index lesion within segment 4 a measures 3.9 cm, image 21/3. -Index lesion within segment 8 measures 3.8 cm, image number 20/3. -index segment 6 lesion measures 3.4 cm, image 46/3. The gallbladder appears unremarkable.  No biliary dilatation. Pancreas: Mild diffuse pancreatic edema noted. No discrete mass identified. No main duct dilatation. Spleen:  Within normal limits in size and appearance. Adrenals/Urinary Tract: Normal appearance of the adrenal glands. No kidney mass or hydronephrosis identified. Stomach/Bowel: No abnormal bowel dilatation identified. Right colon mass is noted measuring 4.3 cm. Vascular/Lymphatic: Normal appearance of the abdominal aorta. No aneurysm. Within the right lower quadrant of the abdomen there is and enlarged enhancing lymph node measuring 2.7 cm, image 35/12. Other:  None Musculoskeletal: No suspicious bone lesions identified. IMPRESSION: 1. There are innumerable peripherally enhancing liver lesions most concerning for diffuse metastatic disease. 2. Mild inflammatory changes of the pancreas compatible with pancreatitis. No complications identified. 3. Ascending colon mass concerning  for primary colonic  neoplasm. A soft tissue nodule is noted within the right lower quadrant of the abdomen suspicious for ileocolic mesenteric adenopathy. 4. Bilateral pleural effusions. Electronically Signed   By: Kerby Moors M.D.   On: 07/23/2018 11:35    Anti-infectives: Anti-infectives (From admission, onward)   None       Assessment/Plan HTN  Acute pancreatitis - uncertain etiology, suspect extrinsic compression from lymphadenopathy - MRCP shows numerous liver lesions/likely metastatic disease, mild pancreatitis  Transaminitis/hyperbilirubinemia - likely 2/2 diffuse liver metastasis  Ascending colon mass, liver metastasis, lymphadenopathy - s/p colonoscopy 10/27, biopsy pathology pending - CEA 33.7 (10/25) - going for liver biopsy today in IR - appreciate oncology follow up  ID - none FEN - IVF, NPO for procedure VTE - SCDs, ok for chemical DVT prophylaxis from surgical standpoint Foley - none Follow up - TBD  Plan - Patient still has no signs of complete bowel obstruction or significant bleeding from colon mass, therefore would not recommend any surgery at this time. Appreciate Dr. Ernestina Penna assessment, treatment per oncology. General surgery will sign off, please call with concerns.   LOS: 5 days    [place]ellington [place] , [facility] 07/24/2018, 10:46 AM Pager: [phone] Mon 7:00 am -11:30 AM Tues-Fri 7:00 am-4:30 pm Sat-Sun 7:00 am-11:30 am

## 2018-07-25 ENCOUNTER — Inpatient Hospital Stay (HOSPITAL_COMMUNITY): Payer: BLUE CROSS/BLUE SHIELD

## 2018-07-25 ENCOUNTER — Other Ambulatory Visit: Payer: Self-pay | Admitting: Hematology

## 2018-07-25 DIAGNOSIS — C787 Secondary malignant neoplasm of liver and intrahepatic bile duct: Secondary | ICD-10-CM

## 2018-07-25 DIAGNOSIS — C799 Secondary malignant neoplasm of unspecified site: Secondary | ICD-10-CM

## 2018-07-25 DIAGNOSIS — C182 Malignant neoplasm of ascending colon: Secondary | ICD-10-CM

## 2018-07-25 DIAGNOSIS — Z7189 Other specified counseling: Secondary | ICD-10-CM

## 2018-07-25 LAB — COMPREHENSIVE METABOLIC PANEL
ALT: 43 U/L (ref 0–44)
ANION GAP: 8 (ref 5–15)
AST: 75 U/L — ABNORMAL HIGH (ref 15–41)
Albumin: 2.2 g/dL — ABNORMAL LOW (ref 3.5–5.0)
Alkaline Phosphatase: 255 U/L — ABNORMAL HIGH (ref 38–126)
BUN: 17 mg/dL (ref 6–20)
CALCIUM: 10.5 mg/dL — AB (ref 8.9–10.3)
CHLORIDE: 101 mmol/L (ref 98–111)
CO2: 23 mmol/L (ref 22–32)
CREATININE: 1.11 mg/dL (ref 0.61–1.24)
Glucose, Bld: 86 mg/dL (ref 70–99)
Potassium: 4.9 mmol/L (ref 3.5–5.1)
SODIUM: 132 mmol/L — AB (ref 135–145)
Total Bilirubin: 2.5 mg/dL — ABNORMAL HIGH (ref 0.3–1.2)
Total Protein: 6.3 g/dL — ABNORMAL LOW (ref 6.5–8.1)

## 2018-07-25 LAB — CBC WITH DIFFERENTIAL/PLATELET
Abs Immature Granulocytes: 0.16 10*3/uL — ABNORMAL HIGH (ref 0.00–0.07)
BASOS ABS: 0.1 10*3/uL (ref 0.0–0.1)
BASOS PCT: 0 %
EOS PCT: 1 %
Eosinophils Absolute: 0.1 10*3/uL (ref 0.0–0.5)
HCT: 32.7 % — ABNORMAL LOW (ref 39.0–52.0)
Hemoglobin: 10.5 g/dL — ABNORMAL LOW (ref 13.0–17.0)
Immature Granulocytes: 1 %
Lymphocytes Relative: 11 %
Lymphs Abs: 1.8 10*3/uL (ref 0.7–4.0)
MCH: 27.6 pg (ref 26.0–34.0)
MCHC: 32.1 g/dL (ref 30.0–36.0)
MCV: 85.8 fL (ref 80.0–100.0)
MONO ABS: 1.9 10*3/uL — AB (ref 0.1–1.0)
Monocytes Relative: 12 %
NEUTROS ABS: 11.9 10*3/uL — AB (ref 1.7–7.7)
NRBC: 0.2 % (ref 0.0–0.2)
Neutrophils Relative %: 75 %
PLATELETS: 339 10*3/uL (ref 150–400)
RBC: 3.81 MIL/uL — AB (ref 4.22–5.81)
RDW: 13.9 % (ref 11.5–15.5)
WBC: 15.9 10*3/uL — AB (ref 4.0–10.5)

## 2018-07-25 LAB — MAGNESIUM: MAGNESIUM: 2.2 mg/dL (ref 1.7–2.4)

## 2018-07-25 LAB — LIPASE, BLOOD: LIPASE: 119 U/L — AB (ref 11–51)

## 2018-07-25 LAB — PHOSPHORUS: PHOSPHORUS: 1.8 mg/dL — AB (ref 2.5–4.6)

## 2018-07-25 MED ORDER — SODIUM CHLORIDE 0.9 % IV BOLUS
1000.0000 mL | Freq: Once | INTRAVENOUS | Status: AC
  Administered 2018-07-25: 1000 mL via INTRAVENOUS

## 2018-07-25 MED ORDER — TECHNETIUM TC 99M MEDRONATE IV KIT
20.1000 | PACK | Freq: Once | INTRAVENOUS | Status: AC | PRN
  Administered 2018-07-25: 20.1 via INTRAVENOUS

## 2018-07-25 MED ORDER — OXYCODONE HCL 5 MG PO TABS: 5.0000 mg | ORAL_TABLET | ORAL | Status: DC | PRN

## 2018-07-25 MED ORDER — ONDANSETRON HCL 8 MG PO TABS: 8.0000 mg | ORAL_TABLET | Freq: Two times a day (BID) | ORAL | 1 refills | Status: DC | PRN

## 2018-07-25 MED ORDER — AMLODIPINE BESYLATE 5 MG PO TABS
5.0000 mg | ORAL_TABLET | Freq: Every day | ORAL | Status: DC
  Administered 2018-07-25 – 2018-07-26 (×2): 5 mg via ORAL
  Filled 2018-07-25 (×2): qty 1

## 2018-07-25 MED ORDER — PROCHLORPERAZINE MALEATE 10 MG PO TABS: 10.0000 mg | ORAL_TABLET | Freq: Four times a day (QID) | ORAL | 1 refills | Status: DC | PRN

## 2018-07-25 MED ORDER — LIDOCAINE-PRILOCAINE 2.5-2.5 % EX CREA: TOPICAL_CREAM | CUTANEOUS | 3 refills | Status: DC

## 2018-07-25 NOTE — Progress Notes (Signed)
START OFF PATHWAY REGIMEN - Colorectal   [id number] + Bevacizumab q14 days **2 cycles per order sheet**:   A cycle is every 14 days:     Bevacizumab-xxxx      Irinotecan      Oxaliplatin      Leucovorin      5-Fluorouracil   **Always confirm dose/schedule in your pharmacy ordering system**  Patient Characteristics: Distant Metastases, First Line, Nonsurgical Candidate, KRAS Mutation Positive/Unknown, BRAF Wild-Type/Unknown, PS = 0,1; Bevacizumab Eligible Therapeutic Status: Distant Metastases BRAF Mutation Status: Awaiting Test Results KRAS/NRAS Mutation Status: Awaiting Test Results Line of Therapy: First Line Performance Status: PS = 0, 1 Bevacizumab Eligibility: Eligible Intent of Therapy: Non-Curative / Palliative Intent, Discussed with Patient

## 2018-07-25 NOTE — Progress Notes (Signed)
Jeffery Roman   DOB:Jun 02, 1966   FX#:[id number]   HGD#:[id number]  Oncology follow up   Subjective: Pt has intermittent epigastric pain, no BM today, no other new complains.  Objective:  Vitals:   07/25/18 1504 07/25/18 2118  BP: (!) 144/89 (!) 164/91  Pulse: (!) 104 (!) 114  Resp: 20 18  Temp: 99.2 F (37.3 C) 99.4 F (37.4 C)  SpO2: 95% 96%    Body mass index is 27.62 kg/m.  Intake/Output Summary (Last 24 hours) at 07/25/2018 2305 Last data filed at 07/25/2018 1800 Gross per 24 hour  Intake 1608.75 ml  Output -  Net 1608.75 ml     Sclerae unicteric  Oropharynx clear  No peripheral adenopathy  Lungs clear -- no rales or rhonchi  Heart regular rate and rhythm  Abdomen benign  MSK no focal spinal tenderness, no peripheral edema  Neuro nonfocal   CBG (last 3)  No results for input(s): GLUCAP in the last 72 hours.   Labs:  Lab Results  Component Value Date   WBC 15.9 (H) 07/25/2018   HGB 10.5 (L) 07/25/2018   HCT 32.7 (L) 07/25/2018   MCV 85.8 07/25/2018   PLT 339 07/25/2018   NEUTROABS 11.9 (H) 07/25/2018   CMP Latest Ref Rng & Units 07/25/2018 07/24/2018 07/23/2018  Glucose 70 - 99 mg/dL 86 80 116(H)  BUN 6 - 20 mg/dL 17 22(H) 21(H)  Creatinine 0.61 - 1.24 mg/dL 1.11 1.28(H) 1.17  Sodium 135 - 145 mmol/L 132(L) 135 133(L)  Potassium 3.5 - 5.1 mmol/L 4.9 4.5 5.2(H)  Chloride 98 - 111 mmol/L 101 102 104  CO2 22 - 32 mmol/L 23 24 20(L)  Calcium 8.9 - 10.3 mg/dL 10.5(H) 10.2 10.0  Total Protein 6.5 - 8.1 g/dL 6.3(L) 6.7 -  Total Bilirubin 0.3 - 1.2 mg/dL 2.5(H) 1.4(H) -  Alkaline Phos 38 - 126 U/L 255(H) 283(H) -  AST 15 - 41 U/L 75(H) 71(H) -  ALT 0 - 44 U/L 43 46(H) -     Urine Studies No results for input(s): UHGB, CRYS in the last 72 hours.  Invalid input(s): UACOL, UAPR, USPG, UPH, UTP, UGL, UKET, UBIL, UNIT, UROB, ULEU, UEPI, UWBC, URBC, UBAC, CAST, [place], [place]  Basic Metabolic Panel: Recent Labs  Lab 07/19/18 0543  07/20/18 1219  07/22/18 9622 07/22/18 2979 07/23/18 0533 07/24/18 0606 07/25/18 0552  NA 129*   < > 132* 131* 132* 133* 135 132*  K 6.0*   < > 5.8* 4.3 4.6 5.2* 4.5 4.9  CL 99   < > 104  --  100 104 102 101  CO2 21*   < > 21*  --  23 20* 24 23  GLUCOSE 96   < > 71  --  92 116* 80 86  BUN 17   < > 16  --  17 21* 22* 17  CREATININE 1.63*   < > 1.36*  --  1.16 1.17 1.28* 1.11  CALCIUM 11.5*   < > 10.1  --  9.9 10.0 10.2 10.5*  MG 1.9  --   --   --   --   --   --  2.2  PHOS  --   --   --   --   --   --  1.4* 1.8*   < > = values in this interval not displayed.   GFR Estimated Creatinine Clearance: 85.7 mL/min (by C-G formula based on SCr of 1.11 mg/dL). Liver Function Tests: Recent Labs  Lab  07/19/18 0543 07/20/18 1219 07/24/18 0606 07/25/18 0552  AST 59* 69* 71* 75*  ALT 41 36 46* 43  ALKPHOS 283* 262* 283* 255*  BILITOT 1.1 2.4* 1.4* 2.5*  PROT 7.2 6.0* 6.7 6.3*  ALBUMIN 2.9* 2.5* 2.6* 2.2*   Recent Labs  Lab 07/20/18 1219 07/25/18 0552  LIPASE 214* 119*   No results for input(s): AMMONIA in the last 168 hours. Coagulation profile Recent Labs  Lab 07/24/18 0856  INR 1.04    CBC: Recent Labs  Lab 07/19/18 0543 07/20/18 1219 07/22/18 0722 07/25/18 0552  WBC 16.3* 15.6*  --  15.9*  NEUTROABS 13.7*  --   --  11.9*  HGB 12.4* 11.0* 10.9* 10.5*  HCT 38.7* 34.6* 32.0* 32.7*  MCV 86.4 87.8  --  85.8  PLT 359 291  --  339   Cardiac Enzymes: Recent Labs  Lab 07/19/18 0543  TROPONINI <0.03   BNP: Invalid input(s): POCBNP CBG: No results for input(s): GLUCAP in the last 168 hours. D-Dimer No results for input(s): DDIMER in the last 72 hours. Hgb A1c No results for input(s): HGBA1C in the last 72 hours. Lipid Profile No results for input(s): CHOL, HDL, LDLCALC, TRIG, CHOLHDL, LDLDIRECT in the last 72 hours. Thyroid function studies No results for input(s): TSH, T4TOTAL, T3FREE, THYROIDAB in the last 72 hours.  Invalid input(s): FREET3 Anemia work up Recent Labs     07/24/18 0606  FERRITIN 520*  TIBC 188*  IRON 20*   Microbiology No results found for this or any previous visit (from the past 240 hour(s)).    Studies:  Nm Bone Scan Whole Body  Result Date: 07/25/2018 CLINICAL DATA:  History of colon cancer. EXAM: NUCLEAR MEDICINE WHOLE BODY BONE SCAN TECHNIQUE: Whole body anterior and posterior images were obtained approximately 3 hours after intravenous injection of radiopharmaceutical. RADIOPHARMACEUTICALS:  20.1 mCi Technetium-43m MDP IV COMPARISON:  PET scan of July 23, 2018. FINDINGS: Symmetric uptake is seen involving both shoulder regions suggesting degenerative change. No other abnormal uptake is noted. IMPRESSION: No scintigraphic evidence of osseous metastases. Electronically Signed   By: Marijo Conception, M.D.   On: 07/25/2018 14:15   US Biopsy (liver)  Result Date: 07/24/2018 CLINICAL DATA:  Innumerable masses throughout the liver and ascending colonic mass. EXAM: ULTRASOUND GUIDED CORE BIOPSY OF LIVER MEDICATIONS: 3.0 mg IV Versed; 100 mcg IV Fentanyl Total Moderate Sedation Time: 10 minutes. The patient's level of consciousness and physiologic status were continuously monitored during the procedure by Radiology nursing. PROCEDURE: The procedure, risks, benefits, and alternatives were explained to the patient. Questions regarding the procedure were encouraged and answered. The patient understands and consents to the procedure. A time out was performed prior to initiating the procedure. Ultrasound was performed to localize liver lesions. The abdominal wall was prepped with chlorhexidine in a sterile fashion, and a sterile drape was applied covering the operative field. A sterile gown and sterile gloves were used for the procedure. Local anesthesia was provided with 1% Lidocaine. A 17 gauge needle was advanced into the left lobe. Three separate 18 gauge coaxial core biopsy samples were obtained and submitted in formalin. Gel-Foam pledgets were  advanced through the outer needle as the needle was retracted. Additional ultrasound was performed. COMPLICATIONS: None. FINDINGS: Multiple masses are seen throughout the liver parenchyma. An area in the left lobe was chosen for biopsy of confluent masses. Solid tissue was obtained. IMPRESSION: Ultrasound-guided core biopsy performed at the level of masses within the left lobe of the  liver. Electronically Signed   By: Aletta Edouard M.D.   On: 07/24/2018 15:30    Assessment: 52 y.o. male with history of HTN, presented with 3 weeks history of epigastric pain, intermittent nausea and vomiting  1. 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  Transaminitis and hyperbilirubinemia 3.  Pancreatitis  4. AKI improving  5.  Small bilateral pleural effusion  Plan:  -I discussed his bone scan results from today, which was negative for mets -his lipase has significantly decreased, I think his pancreatitis has improved, his abdominal pain is probably related to his liver mets, more than pancreatitis  -I have spoken with GI Dr. Penelope Coop who agrees to advance pt's diet, I encourage his to stay on low residual diet, to avoid constipation and bowel obstruction, please also ask dietician to review this with him   -please change iv pain meds to oral, such as oxycodone  -I will ask IR if they can place a port before his discharge  -if he tolerates diet, and pain controlled by oral meds, OK to discharge from oncology stand point  -I plan to see him back next week for f/u, chemo class and first cycle chemo    Truitt Merle, MD 07/25/2018

## 2018-07-25 NOTE — Progress Notes (Signed)
PROGRESS NOTE    Jeffery Roman  TKZ:[id number] DOB: May 08, 1966 DOA: 07/18/2018 PCP: Alroy Dust, L.Marlou Sa, MD    Brief Narrative:  Jeffery Roman is an 52 y.o. male past medical history of hypertension comes in complaining of abdominal pain, she recently seen her PCP and was started on.  Antibiotics no improvement came into the ER was found to be in acute renal failure with mild pancreatitis calcium of 12.6 bilirubin of 1, CT scan of the abdomen pelvis showed pancreatitis with a colonic mass with multiple metastatic lesions to the liver.   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  Colonic mass biopsy consistent with adenocarcinoma.  Patient seen in consultation by oncology, Dr.Feng who is following patient.  Oncology planning to schedule patient for port in the outpatient setting and to start chemotherapy next week.  Per oncology.  2.  Acute pancreatitis Clinically improving.  Lipase levels trending down.  Lipase was 09/26/2002 on admission currently at 119.  Patient with some clinical improvement.  Patient tolerating clear liquids.  We will give 2 L normal saline.  Will advance diet to soft diet after discussion with oncology.  Decrease IV fluids to 75 cc/h.  Supportive care.  Symptomatic treatment.  3.  Acute kidney injury Secondary to prerenal azotemia.  Resolved with hydration.  Follow.  4.  Hyperkalemia Resolved with Veltassa.  5.  Hypercalcemia.   Improved with hydration.  6.  Hypertension Antihypertensive medications on hold.  Resume home  regimen Norvasc.  Continue to hold HCTZ.  Decrease IV fluids to 75 cc/h.   DVT prophylaxis: SCDs Code Status: Full Family Communication: Updated patient.  No family at bedside. Disposition Plan: Home when clinically improved.   Consultants:   Gastroenterology: Dr. Michail Sermon 07/19/2018  General surgery: Dr. Dema Severin 07/20/2018  Interventional radiology Dr. Kathlene Cote 07/24/2018  Oncology: Dr. Burr Medico  Procedures:   CT abdomen and pelvis 07/18/2018  CT chest 07/23/2018  Abdominal x-ray 07/20/2018  MRI/MRCP abdomen 07/23/2018  Bone scan pending 07/25/2018  Ultrasound-guided liver biopsy 07/24/2018  Antimicrobials:   None   Subjective: Patient laying in bed.  Patient with abdominal pain right upper quadrant and epigastrium.  No nausea or emesis.  Tolerating clear liquids.  Objective: Vitals:   07/24/18 1500 07/24/18 1505 07/24/18 2021 07/25/18 0408  BP: (!) 148/84 (!) 144/83 (!) 156/92 135/85  Pulse: (!) 103 (!) 102 (!) 108 (!) 108  Resp: 20 (!) 21 20 18   Temp:   99.3 F (37.4 C) 100.1 F (37.8 C)  TempSrc:   Oral Oral  SpO2: 97% 100% 93% 95%  Weight:    89.8 kg  Height:        Intake/Output Summary (Last 24 hours) at 07/25/2018 1229 Last data filed at 07/25/2018 0300 Gross per 24 hour  Intake 410 ml  Output -  Net 410 ml   Filed Weights   07/20/18 0522 07/23/18 0618 07/25/18 0408  Weight: 91.5 kg 91.6 kg 89.8 kg    Examination:  General exam: Appears calm and comfortable  Respiratory system: Clear to auscultation.  Respiratory effort normal. Cardiovascular system: S1 & S2 heard, RRR. No JVD, murmurs, rubs, gallops or clicks. No pedal edema. Gastrointestinal system: Abdomen is nondistended, soft and tender to palpation and in the epigastrium and exquisitely tender right upper quadrant.  Positive bowel sounds.   Central nervous system: Alert and oriented. No focal neurological deficits. Extremities: Symmetric 5 x 5 power. Skin: No rashes, lesions or  ulcers Psychiatry: Judgement and insight appear normal. Mood & affect appropriate.     Data Reviewed: I have personally reviewed following labs and imaging studies  CBC: Recent Labs  Lab 07/18/18 2114 07/19/18 0543 07/20/18 1219 07/22/18 0722 07/25/18 0552  WBC 14.1* 16.3* 15.6*  --  15.9*  NEUTROABS  --  13.7*  --   --  11.9*  HGB 13.3 12.4* 11.0* 10.9* 10.5*  HCT 41.0 38.7* 34.6* 32.0* 32.7*  MCV 84.7 86.4 87.8  --  85.8  PLT 409* 359 291  --  703   Basic Metabolic Panel: Recent Labs  Lab 07/19/18 0543  07/20/18 1219 07/22/18 0722 07/22/18 0952 07/23/18 0533 07/24/18 0606 07/25/18 0552  NA 129*   < > 132* 131* 132* 133* 135 132*  K 6.0*   < > 5.8* 4.3 4.6 5.2* 4.5 4.9  CL 99   < > 104  --  100 104 102 101  CO2 21*   < > 21*  --  23 20* 24 23  GLUCOSE 96   < > 71  --  92 116* 80 86  BUN 17   < > 16  --  17 21* 22* 17  CREATININE 1.63*   < > 1.36*  --  1.16 1.17 1.28* 1.11  CALCIUM 11.5*   < > 10.1  --  9.9 10.0 10.2 10.5*  MG 1.9  --   --   --   --   --   --  2.2  PHOS  --   --   --   --   --   --  1.4* 1.8*   < > = values in this interval not displayed.   GFR: Estimated Creatinine Clearance: 85.7 mL/min (by C-G formula based on SCr of 1.11 mg/dL). Liver Function Tests: Recent Labs  Lab 07/18/18 2114 07/19/18 0543 07/20/18 1219 07/24/18 0606 07/25/18 0552  AST 58* 59* 69* 71* 75*  ALT 45* 41 36 46* 43  ALKPHOS 335* 283* 262* 283* 255*  BILITOT 1.0 1.1 2.4* 1.4* 2.5*  PROT 8.0 7.2 6.0* 6.7 6.3*  ALBUMIN 3.3* 2.9* 2.5* 2.6* 2.2*   Recent Labs  Lab 07/18/18 2114 07/20/18 1219 07/25/18 0552  LIPASE 1,104* 214* 119*   No results for input(s): AMMONIA in the last 168 hours. Coagulation Profile: Recent Labs  Lab 07/24/18 0856  INR 1.04   Cardiac Enzymes: Recent Labs  Lab 07/19/18 0543  TROPONINI <0.03   BNP (last 3 results) No results for input(s): PROBNP in the last 8760 hours. HbA1C: No results for input(s): HGBA1C in the last 72  hours. CBG: No results for input(s): GLUCAP in the last 168 hours. Lipid Profile: No results for input(s): CHOL, HDL, LDLCALC, TRIG, CHOLHDL, LDLDIRECT in the last 72 hours. Thyroid Function Tests: No results for input(s): TSH, T4TOTAL, FREET4, T3FREE, THYROIDAB in the last 72 hours. Anemia Panel: Recent Labs    07/24/18 0606  FERRITIN 520*  TIBC 188*  IRON 20*   Sepsis Labs: No results for input(s): PROCALCITON, LATICACIDVEN in the last 168 hours.  No results found for this  or any previous visit (from the past 240 hour(s)).       Radiology Studies: US Biopsy (liver)  Result Date: 07/24/2018 CLINICAL DATA:  Innumerable masses throughout the liver and ascending colonic mass. EXAM: ULTRASOUND GUIDED CORE BIOPSY OF LIVER MEDICATIONS: 3.0 mg IV Versed; 100 mcg IV Fentanyl Total Moderate Sedation Time: 10 minutes. The patient's level of consciousness and physiologic status were continuously monitored during the procedure by Radiology nursing. PROCEDURE: The procedure, risks, benefits, and alternatives were explained to the patient. Questions regarding the procedure were encouraged and answered. The patient understands and consents to the procedure. A time out was performed prior to initiating the procedure. Ultrasound was performed to localize liver lesions. The abdominal wall was prepped with chlorhexidine in a sterile fashion, and a sterile drape was applied covering the operative field. A sterile gown and sterile gloves were used for the procedure. Local anesthesia was provided with 1% Lidocaine. A 17 gauge needle was advanced into the left lobe. Three separate 18 gauge coaxial core biopsy samples were obtained and submitted in formalin. Gel-Foam pledgets were advanced through the outer needle as the needle was retracted. Additional ultrasound was performed. COMPLICATIONS: None. FINDINGS: Multiple masses are seen throughout the liver parenchyma. An area in the left lobe was chosen for  biopsy of confluent masses. Solid tissue was obtained. IMPRESSION: Ultrasound-guided core biopsy performed at the level of masses within the left lobe of the liver. Electronically Signed   By: Aletta Edouard M.D.   On: 07/24/2018 15:30        Scheduled Meds: . bisacodyl  10 mg Rectal Daily  . feeding supplement (ENSURE ENLIVE)  237 mL Oral TID BM  . multivitamin with minerals  1 tablet Oral Daily  . polyethylene glycol  17 g Oral Daily  . sodium polystyrene  15 g Oral Once   Continuous Infusions: . sodium chloride 10 mL/hr at 07/21/18 1530  . sodium chloride       LOS: 6 days    Time spent: 35 minutes    Irine Seal, MD [name] Pager [phone]  If 7PM-7AM, please contact night-coverage [email or web address] Password [email or web address] 07/25/2018, 12:29 PM

## 2018-07-26 ENCOUNTER — Telehealth: Payer: Self-pay | Admitting: General Practice

## 2018-07-26 ENCOUNTER — Other Ambulatory Visit: Payer: Self-pay | Admitting: Hematology

## 2018-07-26 DIAGNOSIS — C182 Malignant neoplasm of ascending colon: Secondary | ICD-10-CM

## 2018-07-26 LAB — CBC WITH DIFFERENTIAL/PLATELET
Abs Immature Granulocytes: 0.25 10*3/uL — ABNORMAL HIGH (ref 0.00–0.07)
Basophils Absolute: 0 10*3/uL (ref 0.0–0.1)
Basophils Relative: 0 %
Eosinophils Absolute: 0.2 10*3/uL (ref 0.0–0.5)
Eosinophils Relative: 1 %
HCT: 31.8 % — ABNORMAL LOW (ref 39.0–52.0)
Hemoglobin: 10.3 g/dL — ABNORMAL LOW (ref 13.0–17.0)
Immature Granulocytes: 2 %
Lymphocytes Relative: 11 %
Lymphs Abs: 1.8 10*3/uL (ref 0.7–4.0)
MCH: 27.4 pg (ref 26.0–34.0)
MCHC: 32.4 g/dL (ref 30.0–36.0)
MCV: 84.6 fL (ref 80.0–100.0)
Monocytes Absolute: 2.3 10*3/uL — ABNORMAL HIGH (ref 0.1–1.0)
Monocytes Relative: 14 %
Neutro Abs: 11.5 10*3/uL — ABNORMAL HIGH (ref 1.7–7.7)
Neutrophils Relative %: 72 %
Platelets: 319 10*3/uL (ref 150–400)
RBC: 3.76 MIL/uL — ABNORMAL LOW (ref 4.22–5.81)
RDW: 13.8 % (ref 11.5–15.5)
WBC: 15.9 10*3/uL — ABNORMAL HIGH (ref 4.0–10.5)
nRBC: 0.2 % (ref 0.0–0.2)

## 2018-07-26 LAB — COMPREHENSIVE METABOLIC PANEL
ALT: 42 U/L (ref 0–44)
AST: 74 U/L — ABNORMAL HIGH (ref 15–41)
Albumin: 2.4 g/dL — ABNORMAL LOW (ref 3.5–5.0)
Alkaline Phosphatase: 233 U/L — ABNORMAL HIGH (ref 38–126)
Anion gap: 9 (ref 5–15)
BUN: 14 mg/dL (ref 6–20)
CO2: 22 mmol/L (ref 22–32)
Calcium: 10.7 mg/dL — ABNORMAL HIGH (ref 8.9–10.3)
Chloride: 101 mmol/L (ref 98–111)
Creatinine, Ser: 1.03 mg/dL (ref 0.61–1.24)
GFR calc Af Amer: >60 mL/min (ref >60)
GFR calc non Af Amer: >60 mL/min (ref >60)
Glucose, Bld: 95 mg/dL (ref 70–99)
Potassium: 4.6 mmol/L (ref 3.5–5.1)
Sodium: 132 mmol/L — ABNORMAL LOW (ref 135–145)
Total Bilirubin: 3.8 mg/dL — ABNORMAL HIGH (ref 0.3–1.2)
Total Protein: 6.1 g/dL — ABNORMAL LOW (ref 6.5–8.1)

## 2018-07-26 LAB — MAGNESIUM: MAGNESIUM: 2.2 mg/dL (ref 1.7–2.4)

## 2018-07-26 MED ORDER — PROCHLORPERAZINE EDISYLATE 10 MG/2ML IJ SOLN: 10.0000 mg | Freq: Four times a day (QID) | INTRAMUSCULAR | Status: DC | PRN

## 2018-07-26 MED ORDER — ENSURE ENLIVE PO LIQD: 237.0000 mL | ORAL | Status: DC

## 2018-07-26 MED ORDER — PREMIER PROTEIN SHAKE
11.0000 [oz_av] | Freq: Two times a day (BID) | ORAL | Status: DC
  Administered 2018-07-26 – 2018-07-27 (×2): 11 [oz_av] via ORAL
  Filled 2018-07-26 (×3): qty 325.31

## 2018-07-26 MED ORDER — OXYCODONE HCL 5 MG PO TABS
5.0000 mg | ORAL_TABLET | ORAL | Status: DC | PRN
  Administered 2018-07-26 (×2): 5 mg via ORAL
  Administered 2018-07-27: 10 mg via ORAL
  Filled 2018-07-26 (×2): qty 1
  Filled 2018-07-26: qty 2

## 2018-07-26 MED ORDER — FUROSEMIDE 10 MG/ML IJ SOLN
40.0000 mg | Freq: Once | INTRAMUSCULAR | Status: AC
  Administered 2018-07-26: 40 mg via INTRAVENOUS
  Filled 2018-07-26: qty 4

## 2018-07-26 MED ORDER — POLYETHYLENE GLYCOL 3350 17 G PO PACK
17.0000 g | PACK | Freq: Every day | ORAL | Status: DC | PRN
  Administered 2018-07-26: 17 g via ORAL
  Filled 2018-07-26: qty 1

## 2018-07-26 MED ORDER — ADULT MULTIVITAMIN W/MINERALS CH
1.0000 | ORAL_TABLET | Freq: Every day | ORAL | Status: DC
  Administered 2018-07-26 – 2018-07-27 (×2): 1 via ORAL
  Filled 2018-07-26 (×2): qty 1

## 2018-07-26 NOTE — Telephone Encounter (Signed)
[place] CSW Progress Notes  Request received from [facility], nurse navigator, to connect w patient to discuss ways to support family and patient.  Generic VM left requesting call back to schedule appointment w CSWs.  Edwyna Shell, LCSW Clinical Social Worker Phone:  [phone]

## 2018-07-26 NOTE — Telephone Encounter (Signed)
[place] CSW Progress Notes  Request received from nurse navigator to provide support to patient and spouse re supporting children's process of cancer diagnosis and treatment.  Unable to reach patient, spoke w wife.  Wife has informed children of diagnosis, CSW will provide written packet of information re best practices in supporting children around parent w cancer.  Will leave this information in patients room.  At this point, wife has been primary provider of information to children.  She will review information and contact CSW to schedule a time to develop plan for support of children.  Wife has also requested that CSW meet w children - CSW can arrange that as needed based on parent's perception of need.  Edwyna Shell, LCSW Clinical Social Worker Phone:  [phone]

## 2018-07-26 NOTE — Progress Notes (Signed)
Nutrition Follow-up  DOCUMENTATION CODES:   Not applicable  INTERVENTION:  - Will decrease Ensure Enlive to once/day, this supplement provides 350 kcal and 20 grams of protein. - Will order Premier Protein BID, each supplement provides 160 kcal and 30 grams of protein.  - Will order daily multivitamin with minerals.  - Provided low fiber diet handouts. - Continue to encourage PO intakes.    NUTRITION DIAGNOSIS:   Increased nutrient needs related to acute illness, catabolic illness(pancreatitis, colonic mass with liver lesions) as evidenced by estimated needs. -ongoing  GOAL:   Patient will meet greater than or equal to 90% of their needs -unmet  MONITOR:   PO intake, Supplement acceptance, Weight trends, Labs  REASON FOR ASSESSMENT:   Consult Assessment of nutrition requirement/status, Diet education  ASSESSMENT:   52 y.o. male past medical history of HTN. He presented to the ED with abdominal pain. He recently saw his PCP and was started on antibiotics with no improvement so he presented to the ED.  He was found to be in ARF with mild pancreatitis. CT scan of the abdomen pelvis showed pancreatitis with a colonic mass with multiple metastatic lesions to the liver.  Weight stable throughout hospitalization. Patient's diet has changed many times throughout hospitalization and yesterday at 5:30 PM he was advanced to Soft diet. This was the first time since admission (10/23) he was able to have solid food. He reports that he is taking it slow with PO intakes and that today he ordered cream of chicken soup, ginger ale, and jello for lunch. He was given pain medication and Zofran prior to the meal and was able to consume ~50% of the items.   Patient reports now having solid food since the day or two PTA. Patient unsure about high fiber foods. RD discussed a low fiber diet (12-13 grams/day) with patient and also provided and reviewed handouts from the [employer]  on this topic. Provided tips for incorporating nutrient dense foods while limiting fiber. Patient very appreciative. He plans to try [place] for dinner tonight.   Patient reports that nausea and abdominal discomfort have been well controlled with medications. He reports last episode of emesis was over the weekend when attempting to drink prep for colonoscopy. Patient reports plan for first chemo next week.  Dr. Ernestina Penna note indicates patient has R colon cancer with diffuse mets to liver, bone scan negative for bone mets.    Medications reviewed; 40 mg IV Lasix x1 dose today, 1 packet Miralax/day. Labs reviewed; Na: 132 mmol/L, Ca: 10.7 mg/dL, Alk Phos elevated, AST elevated.    Diet Order:   Diet Order            DIET SOFT Room service appropriate? Yes; Fluid consistency: Thin  Diet effective now              EDUCATION NEEDS:   Education needs have been addressed  Skin:  Skin Assessment: Reviewed RN Assessment  Last BM:  10/31  Height:   Ht Readings from Last 1 Encounters:  07/19/18 5' 10.98" (1.803 m)    Weight:   Wt Readings from Last 1 Encounters:  07/25/18 89.8 kg    Ideal Body Weight:  78.18 kg  BMI:  Body mass index is 27.62 kg/m.  Estimated Nutritional Needs:   Kcal:  2305-2570 (26-29 kcal/kg)  Protein:  120-130 grams  Fluid:  >/= 2.2 L/day     Jarome Matin, MS, RD, LDN, CNSC Inpatient Clinical Dietitian Pager #  [phone] After hours/weekend pager # [phone]

## 2018-07-26 NOTE — Progress Notes (Signed)
PROGRESS NOTE    Jeffery Roman  HDQ:[id number] DOB: [birth year] DOA: 07/18/2018 PCP: Alroy Dust, L.Marlou Sa, MD    Brief Narrative:  Jeffery Roman is an 52 y.o. male past medical history of hypertension comes in complaining of abdominal pain, she recently seen her PCP and was started on.  Antibiotics no improvement came into the ER was found to be in acute renal failure with mild pancreatitis calcium of 12.6 bilirubin of 1, CT scan of the abdomen pelvis showed pancreatitis with a colonic mass with multiple metastatic lesions to the liver.   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  Colonic mass biopsy consistent with adenocarcinoma.  Patient seen in consultation by oncology, Dr.Feng who is following patient.  Oncology planning to schedule patient for port in the outpatient setting and to start chemotherapy next week.  Will place patient on oxycodone 5 to 10 mg p.o. every 4 hours as needed for pain management.  Discontinue twice daily MiraLAX.  Continue Senokot twice daily as patient on pain regimen.  Per oncology.  2.  Acute pancreatitis Clinically improving.  Lipase levels trended down.  Lipase was 1104 on admission currently at 119.  Patient with some clinical improvement.  Patient tolerating clear liquids.  Saline lock IV fluids.  Diet was advanced to a soft diet however patient was hesitant and as such has only been on clears.  Patient encourage to advance diet.  Saline lock IV fluids.  Supportive care.   Symptomatic treatment.   3.  Acute kidney injury Secondary to prerenal azotemia.  Resolved with hydration.  Saline lock IV fluids.  Follow.  4.  Hyperkalemia Resolved with Veltassa.  5.  Hypercalcemia.   Improved with hydration.  Outpatient follow-up with oncology.  6.  Hypertension Antihypertensive medications on hold.  Resumed home regimen Norvasc.  Continue to hold HCTZ.  Saline lock IV fluids as patient noted to have some bibasilar crackles.  We will give patient Lasix 40 mg IV x1.  Resume HCTZ tomorrow.  Follow.     DVT prophylaxis: SCDs Code Status: Full Family Communication: Updated patient.  No family at bedside. Disposition Plan: Home when clinically improved.   Consultants:   Gastroenterology: Dr. Michail Sermon 07/19/2018  General surgery: Dr. Dema Severin 07/20/2018  Interventional radiology Dr. Kathlene Cote 07/24/2018  Oncology: Dr. Burr Medico  Procedures:   CT abdomen and pelvis 07/18/2018  CT chest 07/23/2018  Abdominal x-ray 07/20/2018  MRI/MRCP abdomen 07/23/2018  Bone scan pending 07/25/2018  Ultrasound-guided liver biopsy 07/24/2018  Antimicrobials:   None   Subjective: Patient laying in bed.  Patient states still with some epigastric and right upper quadrant abdominal pain.  Denies any further nausea or emesis.  Tolerated clear liquids.  Patient's diet was advanced however patient was hesitant to order solid diet.  No chest pain.  Patient states feeling slightly short   Objective: Vitals:   07/25/18 0408 07/25/18 1504 07/25/18 2118 07/26/18 0516  BP: 135/85 (!) 144/89 (!) 164/91 (!) 151/92  Pulse: (!) 108 Marland Kitchen)  104 (!) 114 100  Resp: 18 20 18 18   Temp: 100.1 F (37.8 C) 99.2 F (37.3 C) 99.4 F (37.4 C) 99.3 F (37.4 C)  TempSrc: Oral Oral Oral Oral  SpO2: 95% 95% 96% 96%  Weight: 89.8 kg     Height:        Intake/Output Summary (Last 24 hours) at 07/26/2018 1137 Last data filed at 07/26/2018 0200 Gross per 24 hour  Intake 2322.08 ml  Output -    Net 2322.08 ml   Filed Weights   07/20/18 0522 07/23/18 0618 07/25/18 0408  Weight: 91.5 kg 91.6 kg 89.8 kg    Examination:  General exam: Appears calm and comfortable  Respiratory system: Some bibasilar crackles.  No wheezing.  No rhonchi.  Respiratory effort normal. Cardiovascular system: Regular rate and rhythm no murmurs rubs or gallops.  No JVD.  No lower extremity edema. Gastrointestinal system: Abdomen is nondistended, soft and tender to palpation and in the epigastrium and less exquisitely tender right upper quadrant.  Positive bowel sounds.   Central nervous system: Alert and oriented. No focal neurological deficits. Extremities: Symmetric 5 x 5 power. Skin: No rashes, lesions or ulcers Psychiatry: Judgement and insight appear normal. Mood & affect appropriate.     Data Reviewed: I have personally reviewed following labs and imaging studies  CBC: Recent Labs  Lab 07/20/18 1219 07/22/18 0722 07/25/18 0552 07/26/18 0546  WBC 15.6*  --  15.9* 15.9*  NEUTROABS  --   --  11.9* 11.5*  HGB 11.0* 10.9* 10.5* 10.3*  HCT 34.6* 32.0* 32.7* 31.8*  MCV 87.8  --  85.8 84.6  PLT 291  --  339 161   Basic Metabolic Panel: Recent Labs  Lab 07/22/18 0952 07/23/18 0533 07/24/18 0606 07/25/18 0552 07/26/18 0546  NA 132* 133* 135 132* 132*  K 4.6 5.2* 4.5 4.9 4.6  CL 100 104 102 101 101  CO2 23 20* 24 23 22   GLUCOSE 92 116* 80 86 95  BUN 17 21* 22* 17 14  CREATININE 1.16 1.17 1.28* 1.11 1.03  CALCIUM 9.9 10.0 10.2 10.5* 10.7*  MG  --   --   --  2.2 2.2  PHOS  --   --  1.4* 1.8*  --    GFR: Estimated Creatinine Clearance: 92.4 mL/min (by C-G formula based on SCr of 1.03 mg/dL). Liver Function Tests: Recent Labs  Lab 07/20/18 1219 07/24/18 0606 07/25/18 0552 07/26/18 0546  AST 69* 71* 75* 74*  ALT 36 46* 43 42  ALKPHOS 262* 283* 255* 233*  BILITOT 2.4* 1.4* 2.5* 3.8*  PROT 6.0* 6.7 6.3* 6.1*  ALBUMIN 2.5* 2.6* 2.2* 2.4*   Recent Labs  Lab 07/20/18 1219  07/25/18 0552  LIPASE 214* 119*   No results for input(s): AMMONIA in the last 168 hours. Coagulation Profile: Recent Labs  Lab 07/24/18 0856  INR 1.04   Cardiac Enzymes: No results for input(s): CKTOTAL, CKMB, CKMBINDEX, TROPONINI in the last 168 hours. BNP (last 3 results) No results for input(s): PROBNP in the last 8760 hours. HbA1C: No results for input(s): HGBA1C in the last 72 hours. CBG: No results for input(s): GLUCAP in the last 168 hours. Lipid Profile: No results for input(s): CHOL, HDL, LDLCALC, TRIG, CHOLHDL, LDLDIRECT in the last 72 hours. Thyroid Function Tests: No results for input(s): TSH, T4TOTAL, FREET4, T3FREE, THYROIDAB in the last 72 hours. Anemia Panel: Recent Labs    07/24/18 0606  FERRITIN 520*  TIBC 188*  IRON 20*  Sepsis Labs: No results for input(s): PROCALCITON, LATICACIDVEN in the last 168 hours.  No results found for this or any previous visit (from the past 240 hour(s)).       Radiology Studies: Nm Bone Scan Whole Body  Result Date: 07/25/2018 CLINICAL DATA:  History of colon cancer. EXAM: NUCLEAR MEDICINE WHOLE BODY BONE SCAN TECHNIQUE: Whole body anterior and posterior images were obtained approximately 3 hours after intravenous injection of radiopharmaceutical. RADIOPHARMACEUTICALS:  20.1 mCi Technetium-21m MDP IV COMPARISON:  PET scan of July 23, 2018. FINDINGS: Symmetric uptake is seen involving both shoulder regions suggesting degenerative change. No other abnormal uptake is noted. IMPRESSION: No scintigraphic evidence of osseous metastases. Electronically Signed   By: Marijo Conception, M.D.   On: 07/25/2018 14:15   US Biopsy (liver)  Result Date: 07/24/2018 CLINICAL DATA:  Innumerable masses throughout the liver and ascending colonic mass. EXAM: ULTRASOUND GUIDED CORE BIOPSY OF LIVER MEDICATIONS: 3.0 mg IV Versed; 100 mcg IV Fentanyl Total Moderate Sedation Time: 10 minutes. The patient's level of consciousness and physiologic  status were continuously monitored during the procedure by Radiology nursing. PROCEDURE: The procedure, risks, benefits, and alternatives were explained to the patient. Questions regarding the procedure were encouraged and answered. The patient understands and consents to the procedure. A time out was performed prior to initiating the procedure. Ultrasound was performed to localize liver lesions. The abdominal wall was prepped with chlorhexidine in a sterile fashion, and a sterile drape was applied covering the operative field. A sterile gown and sterile gloves were used for the procedure. Local anesthesia was provided with 1% Lidocaine. A 17 gauge needle was advanced into the left lobe. Three separate 18 gauge coaxial core biopsy samples were obtained and submitted in formalin. Gel-Foam pledgets were advanced through the outer needle as the needle was retracted. Additional ultrasound was performed. COMPLICATIONS: None. FINDINGS: Multiple masses are seen throughout the liver parenchyma. An area in the left lobe was chosen for biopsy of confluent masses. Solid tissue was obtained. IMPRESSION: Ultrasound-guided core biopsy performed at the level of masses within the left lobe of the liver. Electronically Signed   By: Aletta Edouard M.D.   On: 07/24/2018 15:30        Scheduled Meds: . amLODipine  5 mg Oral Daily  . bisacodyl  10 mg Rectal Daily  . feeding supplement (ENSURE ENLIVE)  237 mL Oral TID BM  . polyethylene glycol  17 g Oral Daily   Continuous Infusions: . sodium chloride 75 mL/hr at 07/25/18 2204     LOS: 7 days    Time spent: 35 minutes    Irine Seal, MD [name] Pager [phone]  If 7PM-7AM, please contact night-coverage [email or web address] Password [email or web address] 07/26/2018, 11:37 AM

## 2018-07-27 ENCOUNTER — Telehealth: Payer: Self-pay | Admitting: Hematology

## 2018-07-27 DIAGNOSIS — R160 Hepatomegaly, not elsewhere classified: Secondary | ICD-10-CM

## 2018-07-27 LAB — BASIC METABOLIC PANEL
Anion gap: 8 (ref 5–15)
BUN: 16 mg/dL (ref 6–20)
CO2: 24 mmol/L (ref 22–32)
Calcium: 11.4 mg/dL — ABNORMAL HIGH (ref 8.9–10.3)
Chloride: 97 mmol/L — ABNORMAL LOW (ref 98–111)
Creatinine, Ser: 1.08 mg/dL (ref 0.61–1.24)
GFR calc Af Amer: >60 mL/min (ref >60)
GFR calc non Af Amer: >60 mL/min (ref >60)
GLUCOSE: 89 mg/dL (ref 70–99)
POTASSIUM: 4.4 mmol/L (ref 3.5–5.1)
Sodium: 129 mmol/L — ABNORMAL LOW (ref 135–145)

## 2018-07-27 LAB — CBC
HEMATOCRIT: 30.9 % — AB (ref 39.0–52.0)
HEMOGLOBIN: 10.1 g/dL — AB (ref 13.0–17.0)
MCH: 27.5 pg (ref 26.0–34.0)
MCHC: 32.7 g/dL (ref 30.0–36.0)
MCV: 84.2 fL (ref 80.0–100.0)
Platelets: 360 10*3/uL (ref 150–400)
RBC: 3.67 MIL/uL — ABNORMAL LOW (ref 4.22–5.81)
RDW: 14 % (ref 11.5–15.5)
WBC: 15.3 10*3/uL — ABNORMAL HIGH (ref 4.0–10.5)
nRBC: 0 % (ref 0.0–0.2)

## 2018-07-27 LAB — MAGNESIUM: Magnesium: 2.3 mg/dL (ref 1.7–2.4)

## 2018-07-27 MED ORDER — POLYETHYLENE GLYCOL 3350 17 G PO PACK: 17.0000 g | PACK | Freq: Every day | ORAL | 0 refills | Status: AC | PRN

## 2018-07-27 MED ORDER — PROCHLORPERAZINE MALEATE 10 MG PO TABS: 10.0000 mg | ORAL_TABLET | Freq: Four times a day (QID) | ORAL | 0 refills | Status: DC | PRN

## 2018-07-27 MED ORDER — ONDANSETRON HCL 8 MG PO TABS: 8.0000 mg | ORAL_TABLET | Freq: Three times a day (TID) | ORAL | 0 refills | Status: AC | PRN

## 2018-07-27 MED ORDER — ENSURE ENLIVE PO LIQD: 237.0000 mL | ORAL | 0 refills | Status: AC

## 2018-07-27 MED ORDER — AMLODIPINE BESYLATE 10 MG PO TABS: 10.0000 mg | ORAL_TABLET | Freq: Every day | ORAL | 0 refills | Status: AC

## 2018-07-27 MED ORDER — OXYCODONE HCL 5 MG PO TABS: 5.0000 mg | ORAL_TABLET | ORAL | 0 refills | Status: DC | PRN

## 2018-07-27 MED ORDER — AMLODIPINE BESYLATE 10 MG PO TABS
10.0000 mg | ORAL_TABLET | Freq: Every day | ORAL | Status: DC
  Administered 2018-07-27: 10 mg via ORAL
  Filled 2018-07-27: qty 1

## 2018-07-27 MED ORDER — PREMIER PROTEIN SHAKE: 11.0000 [oz_av] | Freq: Two times a day (BID) | ORAL | 0 refills | Status: AC

## 2018-07-27 NOTE — Discharge Summary (Signed)
Physician Discharge Summary  Jeffery Roman:[id number] DOB: 02/25/1966 DOA: 07/18/2018  PCP: Alroy Dust, L.Marlou Sa, MD  Admit date: 07/18/2018 Discharge date: 07/27/2018  Time spent: 60 minutes  Recommendations for Outpatient Follow-up:  1. Follow-up with Dr. Feng/oncology as scheduled next week. 2. Follow-up with Alroy Dust, L.Marlou Sa, MD in 2 weeks.  On follow-up patient will need a basic metabolic profile done to follow-up on electrolytes and renal function.  Patient will need blood pressure reassessed as his HCTZ was discontinued and patient's Norvasc increased to 10 mg daily.   Discharge Diagnoses:  Principal Problem:   Mass of multiple sites of liver - probable metasasis Active Problems:   Acute pancreatitis   Hypercalcemia   Mass of ascending colon - probable cancer   ARF (acute renal failure) (HCC)   Essential hypertension   Colon obstruction - partial - from ascending colon tumor   Liver metastasis ([place])   Metastatic cancer [facility])   Discharge Condition: Stable  Diet recommendation: Regular  Filed Weights   07/20/18 0522 07/23/18 0618 07/25/18 0408  Weight: 91.5 kg 91.6 kg 89.8 kg    History of present illness:  Per Dr. Shawna Orleans Jeffery Roman is a 52 y.o. male with history of hypertension has been experiencing abdominal pain over the last 3 weeks.  Had gone to his PCP and was empirically started on antibiotics for possible prostatitis.  Initially was on Cipro which was discontinued due to intolerance and was changed to Bactrim.  Patient stated despite taking antibiotics his abdominal pain has persistently and progressively worsened.  Had been having some nausea at times vomiting.  Denied any diarrhea.  Pain was mostly in the epigastric area moving across the upper abdomen.  Denied any chest pain shortness of breath fever chills.  Pain acutely worsened yesterday and decided to come to the ER at [facility]  ED Course: In the ER patient's labs show acute renal  failure with creatinine of 1.7 hypercalcemia with calcium of 12.2 and lipase of 2104 with AST of 58 ALT of 45 total bilirubin of 1.  CT abdomen and pelvis done shows features concerning for acute pancreatitis and also features concerning for colonic neoplasm with metastasis.  Patient was given fluid bolus and fentanyl for pain and admitted for further management of acute pancreatitis and possible colonic mass with metastasis.  On exam patient has abdominal tenderness which is mostly in the epigastric and right lower quadrant.  No rigidity or rebound tenderness.  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  CT abdomen and pelvis which was done showed an ascending colonic mass with liver mets and acute pancreatitis.  GI was consulted and patient was seen in consultation by gastroenterology.  Patient underwent a colonoscopy with biopsies taken which were consistent with adenocarcinoma.  General surgery was consulted and was felt no surgical indication at this time.  Status post liver biopsy per IR with pathology consistent with metastatic adenocarcinoma from colonic mass..  Colonic mass biopsy consistent with adenocarcinoma.  Patient seen in consultation by oncology, Dr.Feng who followed the patient throughout the hospitalization.  Oncology planning to schedule patient for port in the outpatient setting and to start chemotherapy next week.    Patient was placed on oral pain medication for pain management as well as a bowel regimen.  Patient be discharged home in stable  condition and is to follow-up with oncology in the outpatient setting.   2.  Acute pancreatitis Patient had presented with epigastric abdominal pain as well as right upper quadrant abdominal pain.  Lipase levels done on admission were elevated at 09/26/2002.   Patient was placed on bowel rest, IV fluids, supportive care.  GI was consulted as well follow the patient throughout the hospitalization.  Patient's lipase levels trended down and patient improved clinically.  Patient was started on clear liquid diet which he tolerated and diet advanced to a soft diet which he tolerated.  Patient also noted to have metastatic adenocarcinoma of the colon with mets to the liver which was felt to likely be playing a role with his upper abdominal pain.  Patient was placed on pain regimen with clinical improvement.  Patient was discharged in stable and improved condition.  Outpatient follow-up.    3.  Acute kidney injury Secondary to prerenal azotemia.    Patient's HCTZ was held.  Patient hydrated with IV fluids with resolution of acute kidney injury.   4.  Hyperkalemia Resolved with Veltassa.  Outpatient follow-up.  5.  Hypercalcemia.   Improved with hydration.  Outpatient follow-up with oncology.  6.  Hypertension Antihypertensive medications were held initially during the hospitalization.  HCTZ was held.  Patient was resumed back on home regimen of Norvasc 5 mg daily which was uptitrated to 10 mg daily by day of discharge.  Outpatient follow-up.  Procedures:  CT abdomen and pelvis 07/18/2018  CT chest 07/23/2018  Abdominal x-ray 07/20/2018  MRI/MRCP abdomen 07/23/2018  Bone scan pending 07/25/2018  Ultrasound-guided liver biopsy 07/24/2018   Consultations:  Gastroenterology: Dr. Michail Sermon 07/19/2018  General surgery: Dr. Dema Severin 07/20/2018  Interventional radiology Dr. Kathlene Cote 07/24/2018  Oncology: Dr. Burr Medico   Discharge Exam: Vitals:   07/27/18 1250 07/27/18 1436  BP: (!) 142/85   Pulse: (!) 111 100  Resp: 20   Temp: 99 F (37.2 C)   SpO2: 95%     General: NAD Cardiovascular: RRR Respiratory: CTAB  Discharge Instructions   Discharge Instructions    Diet general   Complete by:  As directed    Increase activity slowly    Complete by:  As directed      Allergies as of 07/27/2018      Reactions   Ciprofloxacin Other (See Comments)   Too strong    Lisinopril Other (See Comments)   ED      Medication List    STOP taking these medications   hydrochlorothiazide 12.5 MG tablet Commonly known as:  HYDRODIURIL   sulfamethoxazole-trimethoprim 800-160 MG tablet Commonly known as:  BACTRIM DS,SEPTRA DS     TAKE these medications   amLODipine 10 MG tablet Commonly known as:  NORVASC Take 1 tablet (10 mg total) by mouth daily. What changed:    medication strength  how much to take   feeding supplement (ENSURE ENLIVE) Liqd Take 237 mLs by mouth daily. Start taking on:  07/28/2018   loratadine 10 MG tablet Commonly known as:  CLARITIN Take 10 mg by mouth daily.   multivitamin with minerals Tabs tablet Take 1 tablet by mouth daily.   omeprazole 20 MG capsule Commonly known as:  PRILOSEC Take 20 mg by mouth daily.   ondansetron 8 MG tablet Commonly known as:  ZOFRAN Take 1 tablet (8 mg total) by mouth every 8 (eight) hours as needed for nausea or vomiting. What changed:  when to take this   oxyCODONE 5 MG immediate release  tablet Commonly known as:  Oxy IR/ROXICODONE Take 1 tablet (5 mg total) by mouth every 4 (four) hours as needed for moderate pain.   polyethylene glycol packet Commonly known as:  MIRALAX / GLYCOLAX Take 17 g by mouth daily as needed for moderate constipation.   prochlorperazine 10 MG tablet Commonly known as:  COMPAZINE Take 1 tablet (10 mg total) by mouth every 6 (six) hours as needed (NAUSEA).   protein supplement shake Liqd Commonly known as:  PREMIER PROTEIN Take 325 mLs (11 oz total) by mouth 2 (two) times daily between meals.   RENU REWETTING DROPS Soln Place 1 drop into both eyes daily as needed (dry eyes).      Allergies  Allergen Reactions  . Ciprofloxacin Other (See Comments)    Too strong   . Lisinopril Other (See Comments)    ED   Follow-up  Information    Truitt Merle, MD Follow up.   Specialties:  Hematology, Oncology Why:  Follow-up as scheduled Contact information: [place] [address] [phone]        Alroy Dust, L.Marlou Sa, MD. Schedule an appointment as soon as possible for a visit in 2 week(s).   Specialty:  Family Medicine Contact information: [address] [place] [address] [phone]            The results of significant diagnostics from this hospitalization (including imaging, microbiology, ancillary and laboratory) are listed below for reference.    Significant Diagnostic Studies: Dg Abd 1 View - Kub  Result Date: 07/20/2018 CLINICAL DATA:  Evaluate for free peritoneal air. EXAM: ABDOMEN - 1 VIEW COMPARISON:  CT 07/18/2018 FINDINGS: Moderate air and contrast throughout the colon. No definite free peritoneal air. Remainder the exam is unremarkable. IMPRESSION: Nonspecific, nonobstructive bowel gas pattern. No definite free peritoneal air. Recommend upright KUB with diaphragms in field of view versus right lateral decubitus film for better evaluation for free air. Electronically Signed   By: Marin Olp M.D.   On: 07/20/2018 11:03   Ct Chest W Contrast  Result Date: 07/23/2018 CLINICAL DATA:  Inpatient admitted with abdominal pain, pancreatitis and weight loss. Malignant-appearing partially obstructing tumor in the ascending colon biopsied 1 day prior at colonoscopy, pathology pending. Multiple liver masses suspicious for liver metastases at CT. Chest staging. EXAM: CT CHEST WITH CONTRAST TECHNIQUE: Multidetector CT imaging of the chest was performed during intravenous contrast administration. CONTRAST:  39mL OMNIPAQUE IOHEXOL 300 MG/ML  SOLN COMPARISON:  07/18/2018 CT abdomen/pelvis. FINDINGS: Cardiovascular: Normal heart size. No significant pericardial effusion/thickening. Great vessels are normal in course and caliber. No central pulmonary emboli.  Mediastinum/Nodes: No discrete thyroid nodules. Unremarkable esophagus. No pathologically enlarged axillary, mediastinal or hilar lymph nodes. Lungs/Pleura: No pneumothorax. Small dependent bilateral pleural effusions. Mild-to-moderate compressive atelectasis in the dependent lower lobes, left greater than right. No acute consolidative airspace disease, lung masses or significant pulmonary nodules. Upper abdomen: Numerous hypodense liver masses scattered throughout the visualized liver, unchanged from recent CT. Musculoskeletal:  No aggressive appearing focal osseous lesions. IMPRESSION: 1. No findings suspicious for metastatic disease in the chest. 2. Small dependent bilateral pleural effusions with associated dependent lower lobe atelectasis. 3. Redemonstration of innumerable hypodense liver masses suspicious for liver metastases. Electronically Signed   By: Ilona Sorrel M.D.   On: 07/23/2018 13:55   Nm Bone Scan Whole Body  Result Date: 07/25/2018 CLINICAL DATA:  History of colon cancer. EXAM: NUCLEAR MEDICINE WHOLE BODY BONE SCAN TECHNIQUE: Whole body anterior and posterior  images were obtained approximately 3 hours after intravenous injection of radiopharmaceutical. RADIOPHARMACEUTICALS:  20.1 mCi Technetium-80m MDP IV COMPARISON:  PET scan of July 23, 2018. FINDINGS: Symmetric uptake is seen involving both shoulder regions suggesting degenerative change. No other abnormal uptake is noted. IMPRESSION: No scintigraphic evidence of osseous metastases. Electronically Signed   By: Marijo Conception, M.D.   On: 07/25/2018 14:15   Ct Abdomen Pelvis W Contrast  Addendum Date: 07/23/2018   ADDENDUM REPORT: 07/23/2018 15:19 ADDENDUM: RIGHT lower quadrant 3.2 cm solid nodule (axial 69/112) consistent with metastatic lymphadenopathy. Electronically Signed   By: Elon Alas M.D.   On: 07/23/2018 15:19   Result Date: 07/23/2018 CLINICAL DATA:  Worsening epigastric pain for a week, vomiting today. On  treatment for prostatitis. EXAM: CT ABDOMEN AND PELVIS WITH CONTRAST TECHNIQUE: Multidetector CT imaging of the abdomen and pelvis was performed using the standard protocol following bolus administration of intravenous contrast. CONTRAST:  124mL ISOVUE-300 IOPAMIDOL (ISOVUE-300) INJECTION 61% COMPARISON:  CT pelvis December 08, 2014 FINDINGS: LOWER CHEST: No pericardial effusions. Lower lobe atelectasis. Heart size is normal HEPATOBILIARY: Numerous rounded hypodensities throughout the liver measuring to 3.7 cm. Liver size is normal. Normal gallbladder. PANCREAS: Peripancreatic inflammation without parenchymal hypodensity, ductal dilatation, abnormal calcifications or mass. SPLEEN: Normal. ADRENALS/URINARY TRACT: Kidneys are orthotopic, demonstrating symmetric enhancement. No nephrolithiasis, hydronephrosis or solid renal masses. The unopacified ureters are normal in course and caliber. Delayed imaging through the kidneys demonstrates symmetric prompt contrast excretion within the proximal urinary collecting system. Urinary bladder is partially distended and unremarkable. Normal adrenal glands. STOMACH/BOWEL: Eccentric posterior thickening of the ascending colon (axial image 61). The stomach, small bowel are normal in course and caliber without inflammatory changes. Normal appendix. VASCULAR/LYMPHATIC: Aortoiliac vessels are normal in course and caliber. No lymphadenopathy by CT size criteria. REPRODUCTIVE: Normal. OTHER: No intraperitoneal free fluid or free air. Small amount of ascites predominantly in the LEFT upper quadrant and LEFT pericolic gutter. No intraperitoneal free air or focal fluid collection. MUSCULOSKELETAL: Nonacute. Severe L5-S1 disc height loss with endplate sclerosis and vacuum disc compatible with degenerative disc resulting in severe LEFT L5-S1 neural foraminal narrowing. IMPRESSION: 1. Acute pancreatitis without necrosis. 2. Eccentric ascending colon wall thickening concerning for primary  neoplasm. Multiple hepatic metastasis. Recommend colonoscopy and/or PET-CT on non emergent basis. 3. Acute findings discussed with and reconfirmed by Dr.MOLPUS on 07/18/2018 at 11:42 pm. Electronically Signed: By: Elon Alas M.D. On: 07/18/2018 23:45   Mr 3d Recon At Scanner  Result Date: 07/23/2018 CLINICAL DATA:  Pancreatic adenocarcinoma. EXAM: MRI ABDOMEN WITHOUT AND WITH CONTRAST (INCLUDING MRCP) TECHNIQUE: Multiplanar multisequence MR imaging of the abdomen was performed both before and after the administration of intravenous contrast. Heavily T2-weighted images of the biliary and pancreatic ducts were obtained, and three-dimensional MRCP images were rendered by post processing. CONTRAST:  9 cc of Gadavist COMPARISON:  CT AP 07/18/2018 FINDINGS: Lower chest: There are small to moderate bilateral pleural effusions identified. Hepatobiliary: Innumerable peripheral enhancing lesions are identified throughout both lobes of liver compatible with metastatic disease. -index lesion within segment 2 measures 2.9 cm, image 19/3. -Index lesion within segment 4 a measures 3.9 cm, image 21/3. -Index lesion within segment 8 measures 3.8 cm, image number 20/3. -index segment 6 lesion measures 3.4 cm, image 46/3. The gallbladder appears unremarkable.  No biliary dilatation. Pancreas: Mild diffuse pancreatic edema noted. No discrete mass identified. No main duct dilatation. Spleen:  Within normal limits in size and appearance. Adrenals/Urinary Tract: Normal appearance of the  adrenal glands. No kidney mass or hydronephrosis identified. Stomach/Bowel: No abnormal bowel dilatation identified. Right colon mass is noted measuring 4.3 cm. Vascular/Lymphatic: Normal appearance of the abdominal aorta. No aneurysm. Within the right lower quadrant of the abdomen there is and enlarged enhancing lymph node measuring 2.7 cm, image 35/12. Other:  None Musculoskeletal: No suspicious bone lesions identified. IMPRESSION: 1. There  are innumerable peripherally enhancing liver lesions most concerning for diffuse metastatic disease. 2. Mild inflammatory changes of the pancreas compatible with pancreatitis. No complications identified. 3. Ascending colon mass concerning for primary colonic neoplasm. A soft tissue nodule is noted within the right lower quadrant of the abdomen suspicious for ileocolic mesenteric adenopathy. 4. Bilateral pleural effusions. Electronically Signed   By: Kerby Moors M.D.   On: 07/23/2018 11:35   US Biopsy (liver)  Result Date: 07/24/2018 CLINICAL DATA:  Innumerable masses throughout the liver and ascending colonic mass. EXAM: ULTRASOUND GUIDED CORE BIOPSY OF LIVER MEDICATIONS: 3.0 mg IV Versed; 100 mcg IV Fentanyl Total Moderate Sedation Time: 10 minutes. The patient's level of consciousness and physiologic status were continuously monitored during the procedure by Radiology nursing. PROCEDURE: The procedure, risks, benefits, and alternatives were explained to the patient. Questions regarding the procedure were encouraged and answered. The patient understands and consents to the procedure. A time out was performed prior to initiating the procedure. Ultrasound was performed to localize liver lesions. The abdominal wall was prepped with chlorhexidine in a sterile fashion, and a sterile drape was applied covering the operative field. A sterile gown and sterile gloves were used for the procedure. Local anesthesia was provided with 1% Lidocaine. A 17 gauge needle was advanced into the left lobe. Three separate 18 gauge coaxial core biopsy samples were obtained and submitted in formalin. Gel-Foam pledgets were advanced through the outer needle as the needle was retracted. Additional ultrasound was performed. COMPLICATIONS: None. FINDINGS: Multiple masses are seen throughout the liver parenchyma. An area in the left lobe was chosen for biopsy of confluent masses. Solid tissue was obtained. IMPRESSION: Ultrasound-guided  core biopsy performed at the level of masses within the left lobe of the liver. Electronically Signed   By: Aletta Edouard M.D.   On: 07/24/2018 15:30   Mr Abdomen Mrcp W XH Contast  Result Date: 07/23/2018 CLINICAL DATA:  Pancreatic adenocarcinoma. EXAM: MRI ABDOMEN WITHOUT AND WITH CONTRAST (INCLUDING MRCP) TECHNIQUE: Multiplanar multisequence MR imaging of the abdomen was performed both before and after the administration of intravenous contrast. Heavily T2-weighted images of the biliary and pancreatic ducts were obtained, and three-dimensional MRCP images were rendered by post processing. CONTRAST:  9 cc of Gadavist COMPARISON:  CT AP 07/18/2018 FINDINGS: Lower chest: There are small to moderate bilateral pleural effusions identified. Hepatobiliary: Innumerable peripheral enhancing lesions are identified throughout both lobes of liver compatible with metastatic disease. -index lesion within segment 2 measures 2.9 cm, image 19/3. -Index lesion within segment 4 a measures 3.9 cm, image 21/3. -Index lesion within segment 8 measures 3.8 cm, image number 20/3. -index segment 6 lesion measures 3.4 cm, image 46/3. The gallbladder appears unremarkable.  No biliary dilatation. Pancreas: Mild diffuse pancreatic edema noted. No discrete mass identified. No main duct dilatation. Spleen:  Within normal limits in size and appearance. Adrenals/Urinary Tract: Normal appearance of the adrenal glands. No kidney mass or hydronephrosis identified. Stomach/Bowel: No abnormal bowel dilatation identified. Right colon mass is noted measuring 4.3 cm. Vascular/Lymphatic: Normal appearance of the abdominal aorta. No aneurysm. Within the right lower quadrant of the  abdomen there is and enlarged enhancing lymph node measuring 2.7 cm, image 35/12. Other:  None Musculoskeletal: No suspicious bone lesions identified. IMPRESSION: 1. There are innumerable peripherally enhancing liver lesions most concerning for diffuse metastatic disease.  2. Mild inflammatory changes of the pancreas compatible with pancreatitis. No complications identified. 3. Ascending colon mass concerning for primary colonic neoplasm. A soft tissue nodule is noted within the right lower quadrant of the abdomen suspicious for ileocolic mesenteric adenopathy. 4. Bilateral pleural effusions. Electronically Signed   By: Kerby Moors M.D.   On: 07/23/2018 11:35    Microbiology: No results found for this or any previous visit (from the past 240 hour(s)).   Labs: Basic Metabolic Panel: Recent Labs  Lab 07/23/18 0533 07/24/18 0606 07/25/18 0552 07/26/18 0546 07/27/18 0554  NA 133* 135 132* 132* 129*  K 5.2* 4.5 4.9 4.6 4.4  CL 104 102 101 101 97*  CO2 20* 24 23 22 24   GLUCOSE 116* 80 86 95 89  BUN 21* 22* 17 14 16   CREATININE 1.17 1.28* 1.11 1.03 1.08  CALCIUM 10.0 10.2 10.5* 10.7* 11.4*  MG  --   --  2.2 2.2 2.3  PHOS  --  1.4* 1.8*  --   --    Liver Function Tests: Recent Labs  Lab 07/24/18 0606 07/25/18 0552 07/26/18 0546  AST 71* 75* 74*  ALT 46* 43 42  ALKPHOS 283* 255* 233*  BILITOT 1.4* 2.5* 3.8*  PROT 6.7 6.3* 6.1*  ALBUMIN 2.6* 2.2* 2.4*   Recent Labs  Lab 07/25/18 0552  LIPASE 119*   No results for input(s): AMMONIA in the last 168 hours. CBC: Recent Labs  Lab 07/22/18 0722 07/25/18 0552 07/26/18 0546 07/27/18 0554  WBC  --  15.9* 15.9* 15.3*  NEUTROABS  --  11.9* 11.5*  --   HGB 10.9* 10.5* 10.3* 10.1*  HCT 32.0* 32.7* 31.8* 30.9*  MCV  --  85.8 84.6 84.2  PLT  --  339 319 360   Cardiac Enzymes: No results for input(s): CKTOTAL, CKMB, CKMBINDEX, TROPONINI in the last 168 hours. BNP: BNP (last 3 results) No results for input(s): BNP in the last 8760 hours.  ProBNP (last 3 results) No results for input(s): PROBNP in the last 8760 hours.  CBG: No results for input(s): GLUCAP in the last 168 hours.     Signed:  Irine Seal MD.  [name] 07/27/2018, 4:08 PM

## 2018-07-27 NOTE — Telephone Encounter (Signed)
Scheduled appt per 10/31 sch message - unable to reach patient left message with appt date and time.

## 2018-07-27 NOTE — Progress Notes (Signed)
Pt to be discharged to home this afternoon. Discharge teaching including Medications new medications and schedules reviewed with Pt. Pt verbalized understanding of all discharge teaching. Discharge Packet with Prescriptions with Pt at time of discharge. VSS and no acute changes noted with Pt's assessment at time of discharge

## 2018-07-30 ENCOUNTER — Other Ambulatory Visit: Payer: Self-pay | Admitting: Physician Assistant

## 2018-07-30 NOTE — Progress Notes (Signed)
FMLA successfully faxed to [employer] at [phone]. Mailed copy to patient address on file.

## 2018-07-31 ENCOUNTER — Encounter (HOSPITAL_COMMUNITY): Payer: Self-pay

## 2018-07-31 ENCOUNTER — Other Ambulatory Visit: Payer: Self-pay | Admitting: Emergency Medicine

## 2018-07-31 ENCOUNTER — Inpatient Hospital Stay: Payer: BLUE CROSS/BLUE SHIELD | Admitting: Medical

## 2018-07-31 ENCOUNTER — Inpatient Hospital Stay: Payer: BLUE CROSS/BLUE SHIELD | Attending: Medical | Admitting: Medical

## 2018-07-31 ENCOUNTER — Other Ambulatory Visit: Payer: Self-pay | Admitting: Medical

## 2018-07-31 ENCOUNTER — Emergency Department (HOSPITAL_COMMUNITY): Payer: BLUE CROSS/BLUE SHIELD

## 2018-07-31 ENCOUNTER — Inpatient Hospital Stay (HOSPITAL_COMMUNITY)
Admission: EM | Admit: 2018-07-31 | Discharge: 2018-08-06 | DRG: 871 | Disposition: A | Discharge status: Home / Self Care | Payer: BLUE CROSS/BLUE SHIELD | Attending: Family Medicine | Admitting: Family Medicine

## 2018-07-31 ENCOUNTER — Other Ambulatory Visit: Payer: Self-pay | Admitting: Oncology

## 2018-07-31 ENCOUNTER — Ambulatory Visit (HOSPITAL_COMMUNITY)
Admission: RE | Admit: 2018-07-31 | Discharge: 2018-07-31 | Disposition: A | Discharge status: Home / Self Care | Payer: BLUE CROSS/BLUE SHIELD | Source: Ambulatory Visit | Attending: Hematology | Admitting: Hematology

## 2018-07-31 ENCOUNTER — Encounter: Payer: Self-pay | Admitting: Pharmacist

## 2018-07-31 ENCOUNTER — Other Ambulatory Visit: Payer: Self-pay

## 2018-07-31 ENCOUNTER — Inpatient Hospital Stay: Payer: BLUE CROSS/BLUE SHIELD

## 2018-07-31 ENCOUNTER — Ambulatory Visit (HOSPITAL_COMMUNITY)
Admit: 2018-07-31 | Discharge: 2018-07-31 | Disposition: A | Discharge status: Home / Self Care | Payer: BLUE CROSS/BLUE SHIELD | Attending: Hematology | Admitting: Hematology

## 2018-07-31 VITALS — BP 143/76 | HR 105 | Temp 99.2°F | Resp 20 | Ht 70.0 in | Wt 178.9 lb

## 2018-07-31 DIAGNOSIS — D689 Coagulation defect, unspecified: Secondary | ICD-10-CM | POA: Diagnosis present

## 2018-07-31 DIAGNOSIS — C787 Secondary malignant neoplasm of liver and intrahepatic bile duct: Secondary | ICD-10-CM

## 2018-07-31 DIAGNOSIS — D72823 Leukemoid reaction: Secondary | ICD-10-CM

## 2018-07-31 DIAGNOSIS — C182 Malignant neoplasm of ascending colon: Secondary | ICD-10-CM | POA: Insufficient documentation

## 2018-07-31 DIAGNOSIS — D630 Anemia in neoplastic disease: Secondary | ICD-10-CM | POA: Diagnosis present

## 2018-07-31 DIAGNOSIS — K5900 Constipation, unspecified: Secondary | ICD-10-CM | POA: Insufficient documentation

## 2018-07-31 DIAGNOSIS — Z9221 Personal history of antineoplastic chemotherapy: Secondary | ICD-10-CM | POA: Insufficient documentation

## 2018-07-31 DIAGNOSIS — G893 Neoplasm related pain (acute) (chronic): Secondary | ICD-10-CM | POA: Diagnosis not present

## 2018-07-31 DIAGNOSIS — R1011 Right upper quadrant pain: Secondary | ICD-10-CM

## 2018-07-31 DIAGNOSIS — K649 Unspecified hemorrhoids: Secondary | ICD-10-CM | POA: Diagnosis present

## 2018-07-31 DIAGNOSIS — J90 Pleural effusion, not elsewhere classified: Secondary | ICD-10-CM | POA: Diagnosis present

## 2018-07-31 DIAGNOSIS — R6520 Severe sepsis without septic shock: Secondary | ICD-10-CM | POA: Diagnosis not present

## 2018-07-31 DIAGNOSIS — R52 Pain, unspecified: Secondary | ICD-10-CM | POA: Diagnosis present

## 2018-07-31 DIAGNOSIS — D473 Essential (hemorrhagic) thrombocythemia: Secondary | ICD-10-CM | POA: Insufficient documentation

## 2018-07-31 DIAGNOSIS — A419 Sepsis, unspecified organism: Secondary | ICD-10-CM

## 2018-07-31 DIAGNOSIS — E860 Dehydration: Secondary | ICD-10-CM | POA: Diagnosis present

## 2018-07-31 DIAGNOSIS — N179 Acute kidney failure, unspecified: Secondary | ICD-10-CM

## 2018-07-31 DIAGNOSIS — E43 Unspecified severe protein-calorie malnutrition: Secondary | ICD-10-CM

## 2018-07-31 DIAGNOSIS — D72829 Elevated white blood cell count, unspecified: Secondary | ICD-10-CM | POA: Insufficient documentation

## 2018-07-31 DIAGNOSIS — Z5309 Procedure and treatment not carried out because of other contraindication: Secondary | ICD-10-CM

## 2018-07-31 DIAGNOSIS — R112 Nausea with vomiting, unspecified: Secondary | ICD-10-CM

## 2018-07-31 DIAGNOSIS — C799 Secondary malignant neoplasm of unspecified site: Secondary | ICD-10-CM | POA: Diagnosis not present

## 2018-07-31 DIAGNOSIS — C189 Malignant neoplasm of colon, unspecified: Secondary | ICD-10-CM | POA: Diagnosis not present

## 2018-07-31 DIAGNOSIS — K219 Gastro-esophageal reflux disease without esophagitis: Secondary | ICD-10-CM

## 2018-07-31 DIAGNOSIS — M7989 Other specified soft tissue disorders: Secondary | ICD-10-CM | POA: Diagnosis present

## 2018-07-31 DIAGNOSIS — Z888 Allergy status to other drugs, medicaments and biological substances status: Secondary | ICD-10-CM

## 2018-07-31 DIAGNOSIS — D649 Anemia, unspecified: Secondary | ICD-10-CM

## 2018-07-31 DIAGNOSIS — R197 Diarrhea, unspecified: Secondary | ICD-10-CM | POA: Diagnosis not present

## 2018-07-31 DIAGNOSIS — K8309 Other cholangitis: Secondary | ICD-10-CM | POA: Diagnosis not present

## 2018-07-31 DIAGNOSIS — B370 Candidal stomatitis: Secondary | ICD-10-CM | POA: Diagnosis not present

## 2018-07-31 DIAGNOSIS — Z79899 Other long term (current) drug therapy: Secondary | ICD-10-CM

## 2018-07-31 DIAGNOSIS — T451X5A Adverse effect of antineoplastic and immunosuppressive drugs, initial encounter: Secondary | ICD-10-CM

## 2018-07-31 DIAGNOSIS — R17 Unspecified jaundice: Secondary | ICD-10-CM

## 2018-07-31 DIAGNOSIS — Z7189 Other specified counseling: Secondary | ICD-10-CM

## 2018-07-31 DIAGNOSIS — J181 Lobar pneumonia, unspecified organism: Secondary | ICD-10-CM | POA: Diagnosis not present

## 2018-07-31 DIAGNOSIS — Z515 Encounter for palliative care: Secondary | ICD-10-CM | POA: Diagnosis present

## 2018-07-31 DIAGNOSIS — R110 Nausea: Secondary | ICD-10-CM | POA: Diagnosis not present

## 2018-07-31 DIAGNOSIS — D75839 Thrombocytosis, unspecified: Secondary | ICD-10-CM

## 2018-07-31 DIAGNOSIS — K7200 Acute and subacute hepatic failure without coma: Secondary | ICD-10-CM | POA: Diagnosis not present

## 2018-07-31 DIAGNOSIS — I959 Hypotension, unspecified: Secondary | ICD-10-CM

## 2018-07-31 DIAGNOSIS — E440 Moderate protein-calorie malnutrition: Secondary | ICD-10-CM | POA: Diagnosis not present

## 2018-07-31 DIAGNOSIS — Z6826 Body mass index (BMI) 26.0-26.9, adult: Secondary | ICD-10-CM

## 2018-07-31 DIAGNOSIS — Z79891 Long term (current) use of opiate analgesic: Secondary | ICD-10-CM | POA: Diagnosis not present

## 2018-07-31 DIAGNOSIS — J189 Pneumonia, unspecified organism: Secondary | ICD-10-CM | POA: Diagnosis present

## 2018-07-31 DIAGNOSIS — R4182 Altered mental status, unspecified: Secondary | ICD-10-CM

## 2018-07-31 DIAGNOSIS — I10 Essential (primary) hypertension: Secondary | ICD-10-CM | POA: Diagnosis present

## 2018-07-31 LAB — CMP (CANCER CENTER ONLY)
ALT: 50 U/L — ABNORMAL HIGH (ref 0–44)
AST: 118 U/L — ABNORMAL HIGH (ref 15–41)
Albumin: 1.9 g/dL — ABNORMAL LOW (ref 3.5–5.0)
Alkaline Phosphatase: 248 U/L — ABNORMAL HIGH (ref 38–126)
Anion gap: 8 (ref 5–15)
BILIRUBIN TOTAL: 9.2 mg/dL — AB (ref 0.3–1.2)
BUN: 31 mg/dL — ABNORMAL HIGH (ref 6–20)
CO2: 25 mmol/L (ref 22–32)
Calcium: 12.4 mg/dL — ABNORMAL HIGH (ref 8.9–10.3)
Chloride: 103 mmol/L (ref 98–111)
Creatinine: 1.37 mg/dL — ABNORMAL HIGH (ref 0.61–1.24)
GFR, EST NON AFRICAN AMERICAN: 59 mL/min — AB (ref >60)
Glucose, Bld: 89 mg/dL (ref 70–99)
POTASSIUM: 5.3 mmol/L — AB (ref 3.5–5.1)
Sodium: 136 mmol/L (ref 135–145)
TOTAL PROTEIN: 6.5 g/dL (ref 6.5–8.1)

## 2018-07-31 LAB — BASIC METABOLIC PANEL
ANION GAP: 9 (ref 5–15)
BUN: 32 mg/dL — ABNORMAL HIGH (ref 6–20)
CO2: 24 mmol/L (ref 22–32)
CREATININE: 1.21 mg/dL (ref 0.61–1.24)
Calcium: 12.1 mg/dL — ABNORMAL HIGH (ref 8.9–10.3)
Chloride: 105 mmol/L (ref 98–111)
GLUCOSE: 93 mg/dL (ref 70–99)
Potassium: 5 mmol/L (ref 3.5–5.1)
Sodium: 138 mmol/L (ref 135–145)

## 2018-07-31 LAB — I-STAT CG4 LACTIC ACID, ED
LACTIC ACID, VENOUS: 2.98 mmol/L — AB (ref 0.5–1.9)
Lactic Acid, Venous: 2.75 mmol/L (ref 0.5–1.9)

## 2018-07-31 LAB — URINALYSIS, ROUTINE W REFLEX MICROSCOPIC
Glucose, UA: NEGATIVE mg/dL
Ketones, ur: NEGATIVE mg/dL
Leukocytes, UA: NEGATIVE
Nitrite: NEGATIVE
PH: 5 (ref 5.0–8.0)
Protein, ur: NEGATIVE mg/dL
SPECIFIC GRAVITY, URINE: 1.02 (ref 1.005–1.030)

## 2018-07-31 LAB — CBC
HCT: 27.3 % — ABNORMAL LOW (ref 39.0–52.0)
Hemoglobin: 8.8 g/dL — ABNORMAL LOW (ref 13.0–17.0)
MCH: 27 pg (ref 26.0–34.0)
MCHC: 32.2 g/dL (ref 30.0–36.0)
MCV: 83.7 fL (ref 80.0–100.0)
NRBC: 0 % (ref 0.0–0.2)
PLATELETS: 432 10*3/uL — AB (ref 150–400)
RBC: 3.26 MIL/uL — ABNORMAL LOW (ref 4.22–5.81)
RDW: 15 % (ref 11.5–15.5)
WBC: 22 10*3/uL — AB (ref 4.0–10.5)

## 2018-07-31 LAB — LIPASE, BLOOD: LIPASE: 52 U/L — AB (ref 11–51)

## 2018-07-31 LAB — AMMONIA: AMMONIA: 24 umol/L (ref 9–35)

## 2018-07-31 MED ORDER — SODIUM CHLORIDE 0.9 % IV SOLN
INTRAVENOUS | Status: DC
  Administered 2018-07-31: 10:00:00 via INTRAVENOUS

## 2018-07-31 MED ORDER — CEFAZOLIN SODIUM-DEXTROSE 2-4 GM/100ML-% IV SOLN: 2.0000 g | Freq: Once | INTRAVENOUS | Status: DC

## 2018-07-31 MED ORDER — ACETAMINOPHEN 325 MG PO TABS: 650.0000 mg | ORAL_TABLET | Freq: Four times a day (QID) | ORAL | Status: DC | PRN

## 2018-07-31 MED ORDER — IOPAMIDOL (ISOVUE-300) INJECTION 61%
INTRAVENOUS | Status: AC
  Filled 2018-07-31: qty 100

## 2018-07-31 MED ORDER — IOPAMIDOL (ISOVUE-300) INJECTION 61%
100.0000 mL | Freq: Once | INTRAVENOUS | Status: AC | PRN
  Administered 2018-07-31: 100 mL via INTRAVENOUS

## 2018-07-31 MED ORDER — LORATADINE 10 MG PO TABS: 10.0000 mg | ORAL_TABLET | Freq: Every day | ORAL | Status: DC

## 2018-07-31 MED ORDER — ADULT MULTIVITAMIN W/MINERALS CH
1.0000 | ORAL_TABLET | Freq: Every day | ORAL | Status: DC
  Administered 2018-08-01 – 2018-08-03 (×3): 1 via ORAL
  Filled 2018-07-31 (×3): qty 1

## 2018-07-31 MED ORDER — PANTOPRAZOLE SODIUM 40 MG PO TBEC: 40.0000 mg | DELAYED_RELEASE_TABLET | Freq: Every day | ORAL | Status: DC

## 2018-07-31 MED ORDER — SODIUM CHLORIDE 0.9 % IV SOLN
90.0000 mg | Freq: Once | INTRAVENOUS | Status: AC
  Administered 2018-08-01: 90 mg via INTRAVENOUS
  Filled 2018-07-31: qty 10

## 2018-07-31 MED ORDER — PIPERACILLIN-TAZOBACTAM 3.375 G IVPB 30 MIN
3.3750 g | Freq: Once | INTRAVENOUS | Status: AC
  Administered 2018-07-31: 3.375 g via INTRAVENOUS
  Filled 2018-07-31: qty 50

## 2018-07-31 MED ORDER — FENTANYL CITRATE (PF) 100 MCG/2ML IJ SOLN
25.0000 ug | INTRAMUSCULAR | Status: DC | PRN
  Administered 2018-08-01: 25 ug via INTRAVENOUS
  Filled 2018-07-31: qty 2

## 2018-07-31 MED ORDER — SODIUM CHLORIDE 0.9 % IV SOLN
2.0000 g | Freq: Once | INTRAVENOUS | Status: AC
  Administered 2018-07-31: 2 g via INTRAVENOUS
  Filled 2018-07-31: qty 2

## 2018-07-31 MED ORDER — LACTATED RINGERS IV SOLN
INTRAVENOUS | Status: DC
  Administered 2018-08-01: via INTRAVENOUS

## 2018-07-31 MED ORDER — MORPHINE SULFATE 10 MG/ML IJ SOLN
2.0000 mg | Freq: Once | INTRAMUSCULAR | Status: AC
  Administered 2018-07-31: 2 mg via INTRAVENOUS
  Filled 2018-07-31: qty 1

## 2018-07-31 MED ORDER — PIPERACILLIN-TAZOBACTAM 3.375 G IVPB
3.3750 g | Freq: Three times a day (TID) | INTRAVENOUS | Status: AC
  Administered 2018-08-01 – 2018-08-02 (×7): 3.375 g via INTRAVENOUS
  Filled 2018-07-31 (×8): qty 50

## 2018-07-31 MED ORDER — SODIUM CHLORIDE 0.9 % IV SOLN
Freq: Once | INTRAVENOUS | Status: AC
  Administered 2018-07-31: 13:00:00 via INTRAVENOUS
  Filled 2018-07-31: qty 250

## 2018-07-31 MED ORDER — MORPHINE SULFATE (PF) 4 MG/ML IV SOLN
4.0000 mg | Freq: Once | INTRAVENOUS | Status: AC
  Administered 2018-07-31: 4 mg via INTRAVENOUS
  Filled 2018-07-31: qty 1

## 2018-07-31 MED ORDER — MORPHINE SULFATE (PF) 4 MG/ML IV SOLN
INTRAVENOUS | Status: AC
  Filled 2018-07-31: qty 1

## 2018-07-31 MED ORDER — SODIUM CHLORIDE 0.9 % IJ SOLN
INTRAMUSCULAR | Status: AC
  Filled 2018-07-31: qty 50

## 2018-07-31 MED ORDER — LACTATED RINGERS IV BOLUS
1000.0000 mL | Freq: Once | INTRAVENOUS | Status: AC
  Administered 2018-07-31: 1000 mL via INTRAVENOUS

## 2018-07-31 MED ORDER — ENSURE ENLIVE PO LIQD: 237.0000 mL | Freq: Every day | ORAL | Status: DC

## 2018-07-31 MED ORDER — PREMIER PROTEIN SHAKE
11.0000 [oz_av] | Freq: Two times a day (BID) | ORAL | Status: DC
  Administered 2018-08-01 – 2018-08-06 (×7): 11 [oz_av] via ORAL
  Filled 2018-07-31 (×14): qty 325.31

## 2018-07-31 MED ORDER — HEPARIN SODIUM (PORCINE) 5000 UNIT/ML IJ SOLN: 5000.0000 [IU] | Freq: Three times a day (TID) | INTRAMUSCULAR | Status: DC

## 2018-07-31 MED ORDER — ACETAMINOPHEN 325 MG PO TABS
650.0000 mg | ORAL_TABLET | Freq: Once | ORAL | Status: AC
  Administered 2018-07-31: 650 mg via ORAL
  Filled 2018-07-31: qty 2

## 2018-07-31 NOTE — Progress Notes (Deleted)
[place]  [phone] [phone]  Clinic Follow up Note   Patient Care Team: Alroy Dust, L.Marlou Sa, MD as PCP - General (Family Medicine) 07/31/2018  SUMMARY OF ONCOLOGIC HISTORY:   Cancer of right colon ([place])   07/18/2018 Imaging    CT AP IMPRESSION: 1. Acute pancreatitis without necrosis. 2. Eccentric ascending colon wall thickening concerning for primary neoplasm. Multiple hepatic metastasis. Recommend colonoscopy and/or PET-CT on non emergent basis.    07/22/2018 Initial Biopsy    Diagnosis Colon, biopsy, ascending - ADENOCARCINOMA - SEE COMMENT    07/23/2018 Imaging    MR ABD MRCP IMPRESSION: 1. There are innumerable peripherally enhancing liver lesions most concerning for diffuse metastatic disease. 2. Mild inflammatory changes of the pancreas compatible with pancreatitis. No complications identified. 3. Ascending colon mass concerning for primary colonic neoplasm. A soft tissue nodule is noted within the right lower quadrant of the abdomen suspicious for ileocolic mesenteric adenopathy. 4. Bilateral pleural effusions.     07/23/2018 Imaging    IMPRESSION: 1. No findings suspicious for metastatic disease in the chest. 2. Small dependent bilateral pleural effusions with associated dependent lower lobe atelectasis. 3. Redemonstration of innumerable hypodense liver masses suspicious for liver metastases.     07/24/2018 Pathology Results    Diagnosis Liver, needle/core biopsy, left lobe mass - METASTATIC ADENOCARCINOMA TO LIVER, CONSISTENT WITH PATIENT'S CLINICAL HISTORY OF PRIMARY COLONIC ADENOCARCINOMA.    07/25/2018 Initial Diagnosis    Cancer of right colon ([place])    07/25/2018 Imaging    IMPRESSION: No scintigraphic evidence of osseous metastases.    07/31/2018 -  Chemotherapy    The patient had palonosetron (ALOXI) injection 0.25 mg, 0.25 mg, Intravenous,  Once, 0 of 6 cycles irinotecan (CAMPTOSAR) 340 mg in dextrose 5 %  500 mL chemo infusion, 165 mg/m2, Intravenous,  Once, 0 of 6 cycles leucovorin 424 mg in dextrose 5 % 250 mL infusion, 200 mg/m2, Intravenous,  Once, 0 of 6 cycles oxaliplatin (ELOXATIN) 180 mg in dextrose 5 % 500 mL chemo infusion, 85 mg/m2, Intravenous,  Once, 0 of 6 cycles fluorouracil (ADRUCIL) 6,800 mg in sodium chloride 0.9 % 114 mL chemo infusion, 3,200 mg/m2, Intravenous, 1 Day/Dose, 0 of 6 cycles  for chemotherapy treatment.      INTERVAL HISTORY: Please see below for problem oriented charting.  REVIEW OF SYSTEMS:   Constitutional: Denies fevers, chills or abnormal weight loss Eyes: Denies blurriness of vision Ears, nose, mouth, throat, and face: Denies mucositis or sore throat Respiratory: Denies cough, dyspnea or wheezes Cardiovascular: Denies palpitation, chest discomfort or lower extremity swelling Gastrointestinal:  Denies nausea, heartburn or change in bowel habits Skin: Denies abnormal skin rashes Lymphatics: Denies new lymphadenopathy or easy bruising Neurological:Denies numbness, tingling or new weaknesses Behavioral/Psych: Mood is stable, no new changes  All other systems were reviewed with the patient and are negative.  MEDICAL HISTORY:  Past Medical History:  Diagnosis Date  . Hypertension     SURGICAL HISTORY: Past Surgical History:  Procedure Laterality Date  . BIOPSY  07/22/2018   Procedure: BIOPSY;  Surgeon: Wilford Corner, MD;  Location: [facility];  Service: Endoscopy;;  . COLONOSCOPY WITH PROPOFOL N/A 07/22/2018   Procedure: COLONOSCOPY WITH PROPOFOL;  Surgeon: Wilford Corner, MD;  Location: [facility];  Service: Endoscopy;  Laterality: N/A;  . SUBMUCOSAL INJECTION  07/22/2018   Procedure: SUBMUCOSAL INJECTION;  Surgeon: Wilford Corner, MD;  Location: [facility];  Service: Endoscopy;;  tattoo    I have reviewed the social history and  family history with the patient and they are unchanged from previous note.  ALLERGIES:  is allergic  to ciprofloxacin and lisinopril.  MEDICATIONS:  Current Outpatient Medications  Medication Sig Dispense Refill  . amLODipine (NORVASC) 10 MG tablet Take 1 tablet (10 mg total) by mouth daily. 30 tablet 0  . feeding supplement, ENSURE ENLIVE, (ENSURE ENLIVE) LIQD Take 237 mLs by mouth daily. 237 mL 0  . loratadine (CLARITIN) 10 MG tablet Take 10 mg by mouth daily.    . Multiple Vitamin (MULTIVITAMIN WITH MINERALS) TABS tablet Take 1 tablet by mouth daily.    Marland Kitchen omeprazole (PRILOSEC) 20 MG capsule Take 20 mg by mouth daily.    . ondansetron (ZOFRAN) 8 MG tablet Take 1 tablet (8 mg total) by mouth every 8 (eight) hours as needed for nausea or vomiting. 20 tablet 0  . oxyCODONE (OXY IR/ROXICODONE) 5 MG immediate release tablet Take 1 tablet (5 mg total) by mouth every 4 (four) hours as needed for moderate pain. 20 tablet 0  . Oxygen Permeable Lens Products (RENU REWETTING DROPS) SOLN Place 1 drop into both eyes daily as needed (dry eyes).    . polyethylene glycol (MIRALAX / GLYCOLAX) packet Take 17 g by mouth daily as needed for moderate constipation. 14 each 0  . prochlorperazine (COMPAZINE) 10 MG tablet Take 1 tablet (10 mg total) by mouth every 6 (six) hours as needed (NAUSEA). 20 tablet 0  . protein supplement shake (PREMIER PROTEIN) LIQD Take 325 mLs (11 oz total) by mouth 2 (two) times daily between meals.  0   No current facility-administered medications for this visit.    Facility-Administered Medications Ordered in Other Visits  Medication Dose Route Frequency Provider Last Rate Last Dose  . 0.9 %  sodium chloride infusion   Intravenous Continuous Candiss Norse A, PA-C 10 mL/hr at 07/31/18 1000    . ceFAZolin (ANCEF) IVPB 2g/100 mL premix  2 g Intravenous Once Candiss Norse A, PA-C        PHYSICAL EXAMINATION: ECOG PERFORMANCE STATUS: {CHL ONC ECOG DT:[id number]}  There were no vitals filed for this visit. There were no vitals filed for this visit.  GENERAL:alert, no  distress and comfortable SKIN: skin color, texture, turgor are normal, no rashes or significant lesions EYES: normal, Conjunctiva are pink and non-injected, sclera clear OROPHARYNX:no exudate, no erythema and lips, buccal mucosa, and tongue normal  NECK: supple, thyroid normal size, non-tender, without nodularity LYMPH:  no palpable lymphadenopathy in the cervical, axillary or inguinal LUNGS: clear to auscultation and percussion with normal breathing effort HEART: regular rate & rhythm and no murmurs and no lower extremity edema ABDOMEN:abdomen soft, non-tender and normal bowel sounds Musculoskeletal:no cyanosis of digits and no clubbing  NEURO: alert & oriented x 3 with fluent speech, no focal motor/sensory deficits  LABORATORY DATA:  I have reviewed the data as listed CBC Latest Ref Rng & Units 07/31/2018 07/27/2018 07/26/2018  WBC 4.0 - 10.5 K/uL 22.0(H) 15.3(H) 15.9(H)  Hemoglobin 13.0 - 17.0 g/dL 8.8(L) 10.1(L) 10.3(L)  Hematocrit 39.0 - 52.0 % 27.3(L) 30.9(L) 31.8(L)  Platelets 150 - 400 K/uL 432(H) 360 319     CMP Latest Ref Rng & Units 07/31/2018 07/27/2018 07/26/2018  Glucose 70 - 99 mg/dL 89 89 95  BUN 6 - 20 mg/dL 31(H) 16 14  Creatinine 0.61 - 1.24 mg/dL 1.37(H) 1.08 1.03  Sodium 135 - 145 mmol/L 136 129(L) 132(L)  Potassium 3.5 - 5.1 mmol/L 5.3(H) 4.4 4.6  Chloride 98 - 111 mmol/L  103 97(L) 101  CO2 22 - 32 mmol/L 25 24 22   Calcium 8.9 - 10.3 mg/dL 12.4(H) 11.4(H) 10.7(H)  Total Protein 6.5 - 8.1 g/dL 6.5 - 6.1(L)  Total Bilirubin 0.3 - 1.2 mg/dL 9.2(HH) - 3.8(H)  Alkaline Phos 38 - 126 U/L 248(H) - 233(H)  AST 15 - 41 U/L 118(H) - 74(H)  ALT 0 - 44 U/L 50(H) - 42      RADIOGRAPHIC STUDIES: I have personally reviewed the radiological images as listed and agreed with the findings in the report. No results found.   ASSESSMENT & PLAN: 52 y.o. malewith history of HTN, presented with 3 weeks history ofepigastric pain, intermittent nausea and vomiting  1. 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  Transaminitis and hyperbilirubinemia 3.  Pancreatitis  4. AKI improving  5.  Small bilateral pleural effusion   No problem-specific Assessment & Plan notes found for this encounter.   No orders of the defined types were placed in this encounter.  All questions were answered. The patient knows to call the clinic with any problems, questions or concerns. No barriers to learning was detected. I spent {CHL ONC TIME VISIT - ALPFX:[id number]} counseling the patient face to face. The total time spent in the appointment was {CHL ONC TIME VISIT - GDJME:[id number]} and more than 50% was on counseling and review of test results     Alla Feeling, NP 07/31/18

## 2018-07-31 NOTE — Progress Notes (Signed)
[facility] Progress Note   Jeffery Roman [id number] Jul 16, 1966 52 y.o.  Jeffery Roman is managed by Dr. 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 Truitt Merle.  She will follow the patient while he is an inpatient.  Hypercalcemia: A chemistry panel returned with a calcium of 12.37.  The patient was given 2 L of normal saline IV.  Dr. Burr Medico recommends that the patient received pamidronate while an inpatient with continuation of IV hydration.  Right upper quadrant pain: The patient was given morphine 2 mg IV x1.  Hyperbilirubinemia: Patient's bilirubin returned at 9.2 today the patient will be admitted through the emergency room at [facility] after discussion with the hospitalist.  Dr. Earlie Counts recommendations are for consideration a liver ultrasound, and MRI/MRCP if needed, consult GI, to see if he has obstructive jaundice, and if he is a candidate for percutaneous biliary drainage versus stent placement.  Leukemoid reaction: A CBC returned with a WBC of 22.  Jeffery Roman had blood cultures x2 collected along with a urinalysis and urine culture.  Based on his tachycardia, leukocytosis, and confusion he was given cefepime 2 g IV x1 while awaiting his culture results.  His tachycardia did improve with IV hydration but continued to remain elevated at 112 bpm.  Altered mental status:  An ammonia level returned within normal limits at 24.  The patient's calcium was elevated at 12.4.  The patient is being admitted through the emergency room.  Dr. Truitt Merle recommends that the patient be evaluated for dosing with medronate and that the patient continue having IV hydration.  Please see After Visit Summary for patient specific instructions.  Future Appointments  Date Time Provider [place]  08/01/2018 10:45 AM [facility] LAB 2 [facility] None  08/01/2018 11:30 AM Alla Feeling, NP [facility] None  08/01/2018 12:00 PM [facility] CHEMO EDU [facility] None  08/02/2018  9:00 AM [facility] INFUSION [facility] None  08/04/2018 10:45 AM [facility] [place] FLUSH [facility] None  08/15/2018  8:00 AM [facility] LAB 2 [facility] None  08/15/2018  9:00 AM [facility] INFUSION [facility] None  08/15/2018 10:30 AM Karie Mainland, RD [facility] None  08/15/2018 11:00 AM Truitt Merle, MD [facility] None  08/17/2018  3:30 PM [facility] [place] FL[facility] None  08/29/2018  8:15 AM [facility] LAB 2 [facility] None  08/29/2018  8:45 AM Truitt Merle, MD [facility] None  08/29/2018  9:00 AM [facility] INFUSION [facility] None  08/31/2018  2:45 PM [facility] [place] FL[facility] None    Orders Placed This Encounter  Procedures  . CMP ([place] only)  . Ammonia       Subjective:   Patient ID:  Jeffery Roman is a 52 y.o. (DOB 04-Jun-1966) male.  Chief Complaint:  Chief Complaint  Patient presents with  . Fatigue    HPI Jeffery Roman Jeffery Roman is a 52 year old male with a recently diagnosed of a metastatic colon cancer with liver metastasis.  He was most recently discharged from the hospital last Friday.  He presented to interventional radiology today for a port placement.  He was reported to have mental status changes, leukocytosis, and a low-grade fever per Dr. Barbie Banner.  Placement of a port was held.  The patient was referred to the cancer center for evaluation.  According to  the patient and his wife he has been more somnolent with progressive fatigue and anorexia over the past few days.  He is spent most of the time in bed.  Labs from today returned showing a bilirubin at 9.2.  This was up from 3.7 when he was recently discharged.  He is calcium returned at 12.4.  This was up from 11.4 at his discharge.  An ammonia level returned within normal limits at 24.  His potassium was noted to be elevated at 5.3.  A CBC completed in interventional radiology earlier today returned with a WBC of 22.0, hemoglobin of 8.8, hematocrit of 27.3, and a platelet count of 432.  Jeffery Roman reports that he has been taking his pain medications on seeing the patient today he reported that he was having right upper quadrant pain at 6/10.  Given that he was initially tachycardic at 132 with a temperature of 99.2 blood cultures x2, UA and urine cultures were collected.  The patient was given 2 g of cefepime IV he was given 2 mg of morphine IV.  He additionally received 2 L of normal saline IV.  His pulse rate was noted to be 100 and 1:12 and 1/2 L of fluid.  Dr. Truitt Merle had seen the patient with this provider.  She recommended an admission for consideration a liver ultrasound, and MRI/MRCP if needed, consult GI, to see if he has obstructive jaundice, and if he is a candidate for percutaneous biliary drainage versus stent placement. He would need iv fluids and pamidronate for his hypercalcemia, given his mental status change.    Medications: I have reviewed the patient's current medications.  Allergies:  Allergies  Allergen Reactions  . Ciprofloxacin Other (See Comments)    Too strong   . Lisinopril Other (See Comments)    ED    Past Medical History:  Diagnosis Date  . Hypertension     Past Surgical History:  Procedure Laterality Date  . BIOPSY  07/22/2018   Procedure: BIOPSY;  Surgeon: Wilford Corner, MD;  Location: [facility];  Service: Endoscopy;;  . COLONOSCOPY WITH PROPOFOL N/A  07/22/2018   Procedure: COLONOSCOPY WITH PROPOFOL;  Surgeon: Wilford Corner, MD;  Location: [facility];  Service: Endoscopy;  Laterality: N/A;  . SUBMUCOSAL INJECTION  07/22/2018   Procedure: SUBMUCOSAL INJECTION;  Surgeon: Wilford Corner, MD;  Location: [facility];  Service: Endoscopy;;  tattoo    Family History  Problem Relation Age of Onset  . Diabetes Mellitus II Mother   . Prostate cancer Father   . Colon cancer Maternal Grandmother     Social History   Socioeconomic History  . Marital status: Married    Spouse name: Not on file  . Number of children: Not on file  . Years of education: Not on file  . Highest education level: Not on file  Occupational History  . Not on file  Social Needs  . Financial resource strain: Not on file  . Food insecurity:    Worry: Not on file    Inability: Not on file  . Transportation needs:    Medical: Not on file    Non-medical: Not  on file  Tobacco Use  . Smoking status: Never Smoker  . Smokeless tobacco: Never Used  Substance and Sexual Activity  . Alcohol use: Not on file  . Drug use: Not on file  . Sexual activity: Not on file  Lifestyle  . Physical activity:    Days per week: Not on file    Minutes per session: Not on file  . Stress: Not on file  Relationships  . Social connections:    Talks on phone: Not on file    Gets together: Not on file    Attends religious service: Not on file    Active member of club or organization: Not on file    Attends meetings of clubs or organizations: Not on file    Relationship status: Not on file  . Intimate partner violence:    Fear of current or ex partner: Not on file    Emotionally abused: Not on file    Physically abused: Not on file    Forced sexual activity: Not on file  Other Topics Concern  . Not on file  Social History Narrative  . Not on file    Past Medical History, Surgical history, Social history, and Family history were reviewed and updated as appropriate.     Please see review of systems for further details on the patient's review from today.   Review of Systems:  Review of Systems  Constitutional: Positive for appetite change. Negative for chills, diaphoresis and fever.  HENT: Negative for trouble swallowing.   Eyes:       Scleral icterus  Respiratory: Negative for cough, chest tightness and shortness of breath.   Cardiovascular: Negative for chest pain and palpitations.  Gastrointestinal: Positive for abdominal pain (Right upper quadrant abdominal pain). Negative for constipation, diarrhea, nausea and vomiting.       Pale stools  Genitourinary:       Dark yellow urine  Skin: Positive for color change.  Neurological: Positive for weakness. Negative for dizziness and headaches.  Psychiatric/Behavioral: Positive for decreased concentration.    Objective:   Physical Exam:  BP (!) 143/76 (BP Location: Left Arm, Patient Position: Sitting)   Pulse (!) 105   Temp 99.2 F (37.3 C) (Oral)   Resp 20   Ht 5\' 10"  (1.778 m)   Wt 178 lb 14.4 oz (81.1 kg)   SpO2 99%   BMI 25.67 kg/m  ECOG: 1  Physical Exam  Constitutional: He appears lethargic. No distress.  HENT:  Head: Normocephalic and atraumatic.  Mucous membranes are dry  Eyes: Scleral icterus is present.  Neck: Normal range of motion. Neck supple.  Cardiovascular: Regular rhythm and normal heart sounds. Tachycardia present. Exam reveals no gallop and no friction rub.  No murmur heard. Pulmonary/Chest: Effort normal and breath sounds normal. No respiratory distress. He has no wheezes. He has no rales.  Abdominal: Soft. Bowel sounds are normal. He exhibits no distension and no mass. There is tenderness (Right upper quadrant pain). There is no rebound and no guarding.  Lymphadenopathy:    He has no cervical adenopathy.  Neurological: He appears lethargic. Coordination (The patient is ambulating with a shuffling gait.  He was taken to the emergency room in a wheelchair.) abnormal.   Skin: Skin is warm and dry. No rash noted. He is not diaphoretic. No erythema.  Jaundice    Lab Review:     Component Value Date/Time   NA 136 07/31/2018 1431   K 5.3 (H) 07/31/2018 1431  CL 103 07/31/2018 1431   CO2 25 07/31/2018 1431   GLUCOSE 89 07/31/2018 1431   BUN 31 (H) 07/31/2018 1431   CREATININE 1.37 (H) 07/31/2018 1431   CALCIUM 12.4 (H) 07/31/2018 1431   PROT 6.5 07/31/2018 1431   ALBUMIN 1.9 (L) 07/31/2018 1431   AST 118 (H) 07/31/2018 1431   ALT 50 (H) 07/31/2018 1431   ALKPHOS 248 (H) 07/31/2018 1431   BILITOT 9.2 (HH) 07/31/2018 1431   GFRNONAA 59 (L) 07/31/2018 1431   GFRAA >60 07/31/2018 1431       Component Value Date/Time   WBC 22.0 (H) 07/31/2018 1007   RBC 3.26 (L) 07/31/2018 1007   HGB 8.8 (L) 07/31/2018 1007   HCT 27.3 (L) 07/31/2018 1007   PLT 432 (H) 07/31/2018 1007   MCV 83.7 07/31/2018 1007   MCH 27.0 07/31/2018 1007   MCHC 32.2 07/31/2018 1007   RDW 15.0 07/31/2018 1007   LYMPHSABS 1.8 07/26/2018 0546   MONOABS 2.3 (H) 07/26/2018 0546   EOSABS 0.2 07/26/2018 0546   BASOSABS 0.0 07/26/2018 0546   -------------------------------  Imaging from last 24 hours (if applicable):  Radiology interpretation: Dg Abd 1 View - Kub  Result Date: 07/20/2018 CLINICAL DATA:  Evaluate for free peritoneal air. EXAM: ABDOMEN - 1 VIEW COMPARISON:  CT 07/18/2018 FINDINGS: Moderate air and contrast throughout the colon. No definite free peritoneal air. Remainder the exam is unremarkable. IMPRESSION: Nonspecific, nonobstructive bowel gas pattern. No definite free peritoneal air. Recommend upright KUB with diaphragms in field of view versus right lateral decubitus film for better evaluation for free air. Electronically Signed   By: Marin Olp M.D.   On: 07/20/2018 11:03   Ct Chest W Contrast  Result Date: 07/23/2018 CLINICAL DATA:  Inpatient admitted with abdominal pain, pancreatitis and weight loss. Malignant-appearing partially obstructing tumor in the  ascending colon biopsied 1 day prior at colonoscopy, pathology pending. Multiple liver masses suspicious for liver metastases at CT. Chest staging. EXAM: CT CHEST WITH CONTRAST TECHNIQUE: Multidetector CT imaging of the chest was performed during intravenous contrast administration. CONTRAST:  81mL OMNIPAQUE IOHEXOL 300 MG/ML  SOLN COMPARISON:  07/18/2018 CT abdomen/pelvis. FINDINGS: Cardiovascular: Normal heart size. No significant pericardial effusion/thickening. Great vessels are normal in course and caliber. No central pulmonary emboli. Mediastinum/Nodes: No discrete thyroid nodules. Unremarkable esophagus. No pathologically enlarged axillary, mediastinal or hilar lymph nodes. Lungs/Pleura: No pneumothorax. Small dependent bilateral pleural effusions. Mild-to-moderate compressive atelectasis in the dependent lower lobes, left greater than right. No acute consolidative airspace disease, lung masses or significant pulmonary nodules. Upper abdomen: Numerous hypodense liver masses scattered throughout the visualized liver, unchanged from recent CT. Musculoskeletal:  No aggressive appearing focal osseous lesions. IMPRESSION: 1. No findings suspicious for metastatic disease in the chest. 2. Small dependent bilateral pleural effusions with associated dependent lower lobe atelectasis. 3. Redemonstration of innumerable hypodense liver masses suspicious for liver metastases. Electronically Signed   By: Ilona Sorrel M.D.   On: 07/23/2018 13:55   Nm Bone Scan Whole Body  Result Date: 07/25/2018 CLINICAL DATA:  History of colon cancer. EXAM: NUCLEAR MEDICINE WHOLE BODY BONE SCAN TECHNIQUE: Whole body anterior and posterior images were obtained approximately 3 hours after intravenous injection of radiopharmaceutical. RADIOPHARMACEUTICALS:  20.1 mCi Technetium-19m MDP IV COMPARISON:  PET scan of July 23, 2018. FINDINGS: Symmetric uptake is seen involving both shoulder regions suggesting degenerative change. No other  abnormal uptake is noted. IMPRESSION: No scintigraphic evidence of osseous metastases. Electronically Signed   By: Sabino Dick  Brooke Bonito, M.D.   On: 07/25/2018 14:15   Ct Abdomen Pelvis W Contrast  Addendum Date: 07/23/2018   ADDENDUM REPORT: 07/23/2018 15:19 ADDENDUM: RIGHT lower quadrant 3.2 cm solid nodule (axial 69/112) consistent with metastatic lymphadenopathy. Electronically Signed   By: Elon Alas M.D.   On: 07/23/2018 15:19   Result Date: 07/23/2018 CLINICAL DATA:  Worsening epigastric pain for a week, vomiting today. On treatment for prostatitis. EXAM: CT ABDOMEN AND PELVIS WITH CONTRAST TECHNIQUE: Multidetector CT imaging of the abdomen and pelvis was performed using the standard protocol following bolus administration of intravenous contrast. CONTRAST:  129mL ISOVUE-300 IOPAMIDOL (ISOVUE-300) INJECTION 61% COMPARISON:  CT pelvis December 08, 2014 FINDINGS: LOWER CHEST: No pericardial effusions. Lower lobe atelectasis. Heart size is normal HEPATOBILIARY: Numerous rounded hypodensities throughout the liver measuring to 3.7 cm. Liver size is normal. Normal gallbladder. PANCREAS: Peripancreatic inflammation without parenchymal hypodensity, ductal dilatation, abnormal calcifications or mass. SPLEEN: Normal. ADRENALS/URINARY TRACT: Kidneys are orthotopic, demonstrating symmetric enhancement. No nephrolithiasis, hydronephrosis or solid renal masses. The unopacified ureters are normal in course and caliber. Delayed imaging through the kidneys demonstrates symmetric prompt contrast excretion within the proximal urinary collecting system. Urinary bladder is partially distended and unremarkable. Normal adrenal glands. STOMACH/BOWEL: Eccentric posterior thickening of the ascending colon (axial image 61). The stomach, small bowel are normal in course and caliber without inflammatory changes. Normal appendix. VASCULAR/LYMPHATIC: Aortoiliac vessels are normal in course and caliber. No lymphadenopathy by CT size  criteria. REPRODUCTIVE: Normal. OTHER: No intraperitoneal free fluid or free air. Small amount of ascites predominantly in the LEFT upper quadrant and LEFT pericolic gutter. No intraperitoneal free air or focal fluid collection. MUSCULOSKELETAL: Nonacute. Severe L5-S1 disc height loss with endplate sclerosis and vacuum disc compatible with degenerative disc resulting in severe LEFT L5-S1 neural foraminal narrowing. IMPRESSION: 1. Acute pancreatitis without necrosis. 2. Eccentric ascending colon wall thickening concerning for primary neoplasm. Multiple hepatic metastasis. Recommend colonoscopy and/or PET-CT on non emergent basis. 3. Acute findings discussed with and reconfirmed by Dr.MOLPUS on 07/18/2018 at 11:42 pm. Electronically Signed: By: Elon Alas M.D. On: 07/18/2018 23:45   Mr 3d Recon At Scanner  Result Date: 07/23/2018 CLINICAL DATA:  Pancreatic adenocarcinoma. EXAM: MRI ABDOMEN WITHOUT AND WITH CONTRAST (INCLUDING MRCP) TECHNIQUE: Multiplanar multisequence MR imaging of the abdomen was performed both before and after the administration of intravenous contrast. Heavily T2-weighted images of the biliary and pancreatic ducts were obtained, and three-dimensional MRCP images were rendered by post processing. CONTRAST:  9 cc of Gadavist COMPARISON:  CT AP 07/18/2018 FINDINGS: Lower chest: There are small to moderate bilateral pleural effusions identified. Hepatobiliary: Innumerable peripheral enhancing lesions are identified throughout both lobes of liver compatible with metastatic disease. -index lesion within segment 2 measures 2.9 cm, image 19/3. -Index lesion within segment 4 a measures 3.9 cm, image 21/3. -Index lesion within segment 8 measures 3.8 cm, image number 20/3. -index segment 6 lesion measures 3.4 cm, image 46/3. The gallbladder appears unremarkable.  No biliary dilatation. Pancreas: Mild diffuse pancreatic edema noted. No discrete mass identified. No main duct dilatation. Spleen:   Within normal limits in size and appearance. Adrenals/Urinary Tract: Normal appearance of the adrenal glands. No kidney mass or hydronephrosis identified. Stomach/Bowel: No abnormal bowel dilatation identified. Right colon mass is noted measuring 4.3 cm. Vascular/Lymphatic: Normal appearance of the abdominal aorta. No aneurysm. Within the right lower quadrant of the abdomen there is and enlarged enhancing lymph node measuring 2.7 cm, image 35/12. Other:  None Musculoskeletal: No suspicious bone lesions  identified. IMPRESSION: 1. There are innumerable peripherally enhancing liver lesions most concerning for diffuse metastatic disease. 2. Mild inflammatory changes of the pancreas compatible with pancreatitis. No complications identified. 3. Ascending colon mass concerning for primary colonic neoplasm. A soft tissue nodule is noted within the right lower quadrant of the abdomen suspicious for ileocolic mesenteric adenopathy. 4. Bilateral pleural effusions. Electronically Signed   By: Kerby Moors M.D.   On: 07/23/2018 11:35   US Biopsy (liver)  Result Date: 07/24/2018 CLINICAL DATA:  Innumerable masses throughout the liver and ascending colonic mass. EXAM: ULTRASOUND GUIDED CORE BIOPSY OF LIVER MEDICATIONS: 3.0 mg IV Versed; 100 mcg IV Fentanyl Total Moderate Sedation Time: 10 minutes. The patient's level of consciousness and physiologic status were continuously monitored during the procedure by Radiology nursing. PROCEDURE: The procedure, risks, benefits, and alternatives were explained to the patient. Questions regarding the procedure were encouraged and answered. The patient understands and consents to the procedure. A time out was performed prior to initiating the procedure. Ultrasound was performed to localize liver lesions. The abdominal wall was prepped with chlorhexidine in a sterile fashion, and a sterile drape was applied covering the operative field. A sterile gown and sterile gloves were used for the  procedure. Local anesthesia was provided with 1% Lidocaine. A 17 gauge needle was advanced into the left lobe. Three separate 18 gauge coaxial core biopsy samples were obtained and submitted in formalin. Gel-Foam pledgets were advanced through the outer needle as the needle was retracted. Additional ultrasound was performed. COMPLICATIONS: None. FINDINGS: Multiple masses are seen throughout the liver parenchyma. An area in the left lobe was chosen for biopsy of confluent masses. Solid tissue was obtained. IMPRESSION: Ultrasound-guided core biopsy performed at the level of masses within the left lobe of the liver. Electronically Signed   By: Aletta Edouard M.D.   On: 07/24/2018 15:30   Mr Abdomen Mrcp W NW Contast  Result Date: 07/23/2018 CLINICAL DATA:  Pancreatic adenocarcinoma. EXAM: MRI ABDOMEN WITHOUT AND WITH CONTRAST (INCLUDING MRCP) TECHNIQUE: Multiplanar multisequence MR imaging of the abdomen was performed both before and after the administration of intravenous contrast. Heavily T2-weighted images of the biliary and pancreatic ducts were obtained, and three-dimensional MRCP images were rendered by post processing. CONTRAST:  9 cc of Gadavist COMPARISON:  CT AP 07/18/2018 FINDINGS: Lower chest: There are small to moderate bilateral pleural effusions identified. Hepatobiliary: Innumerable peripheral enhancing lesions are identified throughout both lobes of liver compatible with metastatic disease. -index lesion within segment 2 measures 2.9 cm, image 19/3. -Index lesion within segment 4 a measures 3.9 cm, image 21/3. -Index lesion within segment 8 measures 3.8 cm, image number 20/3. -index segment 6 lesion measures 3.4 cm, image 46/3. The gallbladder appears unremarkable.  No biliary dilatation. Pancreas: Mild diffuse pancreatic edema noted. No discrete mass identified. No main duct dilatation. Spleen:  Within normal limits in size and appearance. Adrenals/Urinary Tract: Normal appearance of the adrenal  glands. No kidney mass or hydronephrosis identified. Stomach/Bowel: No abnormal bowel dilatation identified. Right colon mass is noted measuring 4.3 cm. Vascular/Lymphatic: Normal appearance of the abdominal aorta. No aneurysm. Within the right lower quadrant of the abdomen there is and enlarged enhancing lymph node measuring 2.7 cm, image 35/12. Other:  None Musculoskeletal: No suspicious bone lesions identified. IMPRESSION: 1. There are innumerable peripherally enhancing liver lesions most concerning for diffuse metastatic disease. 2. Mild inflammatory changes of the pancreas compatible with pancreatitis. No complications identified. 3. Ascending colon mass concerning for primary colonic neoplasm.  A soft tissue nodule is noted within the right lower quadrant of the abdomen suspicious for ileocolic mesenteric adenopathy. 4. Bilateral pleural effusions. Electronically Signed   By: Kerby Moors M.D.   On: 07/23/2018 11:35        This patient was seen with Dr. Burr Medico with my treatment plan reviewed with her. She expressed agreement with my medical management of this patient.

## 2018-07-31 NOTE — ED Notes (Signed)
ED TO INPATIENT HANDOFF REPORT  Name/Age/Gender Jeffery Roman 52 y.o. male  Code Status    Code Status Orders  (From admission, onward)         Start     Ordered   07/31/18 2211  Full code  Continuous     07/31/18 2215        Code Status History    Date Active Date Inactive Code Status Order ID Comments User Context   07/19/2018 0317 07/27/2018 2134 Full Code [id number]  [name], MD Inpatient      Home/SNF/Other Home  Chief Complaint cancer pt   Level of Care/Admitting Diagnosis ED Disposition    ED Disposition Condition [place]: [facility] [[id number]]  Level of Care: Stepdown [14]  Admit to SDU based on following criteria: Severe physiological/psychological symptoms:  Any diagnosis requiring assessment & intervention at least every 4 hours on an ongoing basis to obtain desired patient outcomes including stability and rehabilitation  Diagnosis: Bilirubinemia [237628]  Admitting Physician: Shela Leff [[id number]]  Attending Physician: Shela Leff [[id number]]  Estimated length of stay: past midnight tomorrow  Certification:: I certify this patient will need inpatient services for at least 2 midnights  PT Class (Do Not Modify): Inpatient [101]  PT Acc Code (Do Not Modify): Private [1]       Medical History Past Medical History:  Diagnosis Date  . Hypertension     Allergies Allergies  Allergen Reactions  . Ciprofloxacin Other (See Comments)    Too strong   . Lisinopril Other (See Comments)    ED    IV Location/Drains/Wounds Patient Lines/Drains/Airways Status   Active Line/Drains/Airways    Name:   Placement date:   Placement time:   Site:   Days:   Peripheral IV 07/31/18 Right;Posterior Hand   07/31/18    1000    Hand   less than 1          Labs/Imaging Results for orders placed or performed during the hospital encounter of 07/31/18 (from the past 48 hour(s))  Urinalysis, Routine w  reflex microscopic     Status: Abnormal   Collection Time: 07/31/18  5:50 PM  Result Value Ref Range   Color, Urine Issiah Huffaker (A) YELLOW    Comment: BIOCHEMICALS MAY BE AFFECTED BY COLOR   APPearance HAZY (A) CLEAR   Specific Gravity, Urine 1.020 1.005 - 1.030   pH 5.0 5.0 - 8.0   Glucose, UA NEGATIVE NEGATIVE mg/dL   Hgb urine dipstick SMALL (A) NEGATIVE   Bilirubin Urine SMALL (A) NEGATIVE   Ketones, ur NEGATIVE NEGATIVE mg/dL   Protein, ur NEGATIVE NEGATIVE mg/dL   Nitrite NEGATIVE NEGATIVE   Leukocytes, UA NEGATIVE NEGATIVE   RBC / HPF 0-5 0 - 5 RBC/hpf   WBC, UA 0-5 0 - 5 WBC/hpf   Bacteria, UA RARE (A) NONE SEEN   Mucus PRESENT    Hyaline Casts, UA PRESENT    Granular Casts, UA PRESENT     Comment: Performed at [facility], [place] [address] [place], [place] [address]  I-Stat CG4 Lactic Acid, ED     Status: Abnormal   Collection Time: 07/31/18  6:05 PM  Result Value Ref Range   Lactic Acid, Venous 2.98 (HH) 0.5 - 1.9 mmol/L   Comment NOTIFIED PHYSICIAN   Lipase, blood     Status: Abnormal   Collection Time: 07/31/18  6:18 PM  Result Value Ref Range   Lipase  52 (H) 11 - 51 U/L    Comment: Performed at [facility], [place] [address], [place], [place] [address]  I-Stat CG4 Lactic Acid, ED     Status: Abnormal   Collection Time: 07/31/18 10:31 PM  Result Value Ref Range   Lactic Acid, Venous 2.75 (HH) 0.5 - 1.9 mmol/L   Comment NOTIFIED PHYSICIAN    Dg Chest 2 View  Result Date: 07/31/2018 CLINICAL DATA:  Fever.  Liver metastasis. EXAM: CHEST - 2 VIEW COMPARISON:  None. FINDINGS: The heart size and mediastinal contours are within normal limits. Both lungs are clear. The lung volumes are low. The visualized skeletal structures are unremarkable. IMPRESSION: No active cardiopulmonary disease. Electronically Signed   By: Abelardo Diesel M.D.   On: 07/31/2018 18:43   Ct Abdomen Pelvis W Contrast  Result Date: 07/31/2018 CLINICAL DATA:  Abdominal  distension. Hypotension and tachycardia. Elevated bilirubin. History of metastatic colon cancer. EXAM: CT ABDOMEN AND PELVIS WITH CONTRAST TECHNIQUE: Multidetector CT imaging of the abdomen and pelvis was performed using the standard protocol following bolus administration of intravenous contrast. CONTRAST:  164mL ISOVUE-300 IOPAMIDOL (ISOVUE-300) INJECTION 61% COMPARISON:  Abdominal MRI 07/23/2018. CT abdomen and pelvis 07/18/2018. FINDINGS: Lower chest: Consolidative opacities in the left greater than right lower lobes and lingula. No pleural effusion. Hepatobiliary: Numerous hypoattenuating lesions are again seen throughout the liver with some appearing mildly larger than on the prior studies (for example a 4.3 cm segment IVa lesion on series 3, image 33 which measured 3.7 cm on the prior CT). Layering hyperattenuating material in the gallbladder, possibly sludge. Possible mild gallbladder wall thickening. No biliary dilatation. Pancreas: Nearly completely resolved peripancreatic inflammation. No pancreatic ductal dilatation. Spleen: Unremarkable. Adrenals/Urinary Tract: Unremarkable adrenal glands. No evidence of renal mass, calculi, or hydronephrosis. Unremarkable bladder. Stomach/Bowel: Masslike wall thickening in the ascending colon as previously seen. No bowel obstruction. Unremarkable appendix. Vascular/Lymphatic: Normal caliber of the abdominal aorta. 3.9 x 2.8 cm right lower quadrant mesenteric lymph node, mildly enlarged from the prior CT (previously 3.2 x 2.5 cm). Reproductive: Unremarkable prostate. Other: Small volume ascites. No pneumoperitoneum. Small fat containing umbilical hernia. Musculoskeletal: No suspicious osseous lesion. Severe L5-S1 disc space narrowing with vacuum disc and degenerative endplate sclerosis as well as asymmetric left neural foraminal stenosis. IMPRESSION: 1. Mild worsening of diffuse liver metastases and right lower quadrant mesenteric lymphadenopathy. 2. Ascending colon  mass as previously described. 3. Nearly completely resolved pancreatic inflammation. 4. Left greater than right basilar lung opacities which may reflect pneumonia or atelectasis. 5. Persistent small volume ascites. Electronically Signed   By: Logan Bores M.D.   On: 07/31/2018 19:54   US Abdomen Limited Ruq  Result Date: 07/31/2018 CLINICAL DATA:  Worsened pain, jaundice and elevated white count. Metastatic colon cancer. EXAM: ULTRASOUND ABDOMEN LIMITED RIGHT UPPER QUADRANT COMPARISON:  Multiple CT and MR studies of the last 2 weeks. FINDINGS: Gallbladder: Gallbladder contains sludge but no shadowing stones. No Murphy sign. Maximal wall thickness 3 mm. No surrounding fluid. Common bile duct: Diameter: 6 mm Liver: Innumerable metastatic lesions throughout the liver. Previously evaluated by CT. Portal vein is patent on color Doppler imaging with normal direction of blood flow towards the liver. IMPRESSION: Widespread and advanced hepatic metastatic disease as known by previous imaging studies. The gallbladder contains sludge but no shadowing stones. There is no Murphy sign. Borderline gallbladder wall thickness but no surrounding fluid. The overall balance of the information argues against cholecystitis. Electronically Signed   By: Nelson Chimes  M.D.   On: 07/31/2018 20:43    Pending Labs Unresulted Labs (From admission, onward)    Start     Ordered   08/01/18 0500  CBC  Tomorrow morning,   R     07/31/18 2215   08/01/18 2778  Basic metabolic panel  Tomorrow morning,   R     07/31/18 2215   08/01/18 0500  Hepatic function panel  Tomorrow morning,   R     07/31/18 2215   07/31/18 2213  Calcium, ionized  Add-on,   R     07/31/18 2215   07/31/18 2213  Procalcitonin - Baseline  STAT,   STAT     07/31/18 2215   07/31/18 1737  Urine culture  STAT,   STAT     07/31/18 1737          Vitals/Pain Today's Vitals   07/31/18 1959 07/31/18 2000 07/31/18 2021 07/31/18 2136  BP:  137/80 137/80 135/76   Pulse:  (!) 106 (!) 101 (!) 106  Resp:  (!) 30 20 20   Temp:      TempSrc:      SpO2:  96% 95% 95%  PainSc: 4        Isolation Precautions No active isolations  Medications Medications  iopamidol (ISOVUE-300) 61 % injection (has no administration in time range)  sodium chloride 0.9 % injection (has no administration in time range)  loratadine (CLARITIN) tablet 10 mg (has no administration in time range)  feeding supplement (ENSURE ENLIVE) (ENSURE ENLIVE) liquid 237 mL (has no administration in time range)  multivitamin with minerals tablet 1 tablet (has no administration in time range)  protein supplement (PREMIER PROTEIN) liquid - approved for s/p bariatric surgery (has no administration in time range)  pantoprazole (PROTONIX) EC tablet 40 mg (has no administration in time range)  heparin injection 5,000 Units (has no administration in time range)  pamidronate (AREDIA) 90 mg in sodium chloride 0.9 % 500 mL IVPB (has no administration in time range)  lactated ringers infusion (has no administration in time range)  fentaNYL (SUBLIMAZE) injection 25 mcg (has no administration in time range)  acetaminophen (TYLENOL) tablet 650 mg (has no administration in time range)  piperacillin-tazobactam (ZOSYN) IVPB 3.375 g (has no administration in time range)  lactated ringers bolus 1,000 mL (0 mLs Intravenous Stopped 07/31/18 2027)  morphine 4 MG/ML injection 4 mg (4 mg Intravenous Given 07/31/18 1759)  piperacillin-tazobactam (ZOSYN) IVPB 3.375 g (0 g Intravenous Stopped 07/31/18 2027)  acetaminophen (TYLENOL) tablet 650 mg (650 mg Oral Given 07/31/18 1848)  iopamidol (ISOVUE-300) 61 % injection 100 mL (100 mLs Intravenous Contrast Given 07/31/18 1857)    Mobility walks with person assist

## 2018-07-31 NOTE — Progress Notes (Signed)
Procedure cancelled for today due to concerning lab results/vital signs. Pt discharged per Dr. Barbie Banner orders with IV saline lock intact to cancer center for evaluation. Pt and wife verbalize understanding and agreement with plan.  Coolidge Breeze, RN 07/31/2018

## 2018-07-31 NOTE — Progress Notes (Signed)
Addendum  I have seen the patient, examined him with NP Sandi Mealy.   Patient came in for port placement today. IR Dr. Barbie Banner was very concerned about his mental status, cytosis and low-grade fever.  Port was held, and patient was sent to our cancer center for evaluation. Patient was recently diagnosed with metastatic colon cancer, discharged from hospital last Friday.  He has been very fatigued, progressively somnolent in the past few days, and spent most time in the bed.  He appears to be jaundiced on exam.  Lab reviewed, he has significant worsening liver function, with total bilirubin 9.2 today, which was 3.8 5 days ago.  His calcium also elevated at 12.4, likely related to his underlying malignancy.  I recommend hospital admission for further work-up and management.  His worsening liver function is likely related to the liver metastasis, please consider liver ultrasound, and MRI/MRCP if needed, consult GI, to see if he has obstructive jaundice, and if he is a candidate for percutaneous biliary drainage versus stent placement. He would need iv fluids and pamidronate for his hypercalcemia, given his mental status change.  ID work-up including blood and urine culture has been sent, results are pending.   I will f/u closely when he is in the hospital.   Truitt Merle  07/31/2018 5:16 PM

## 2018-07-31 NOTE — Progress Notes (Signed)
Report given to [facility] RN in ED for room 11.  Pt transported with belongings and wife in [facility].

## 2018-07-31 NOTE — H&P (Signed)
History and Physical    Jeffery Roman GPQ:[id number] DOB: Feb 04, 1966 DOA: 07/31/2018  PCP: Alroy Dust, L.Marlou Sa, MD Patient coming from: Cancer center  Chief Complaint: Fever, leukocytosis, worsening liver function  HPI: Jeffery Roman is a 52 y.o. 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  Patient went there today for port placement but was found to have temperature of 99.90F and white count of 22,000.  There was a change in his baseline mental status.  He appeared fatigued, progressively somnolent in the past few days. Appeared jaundiced on exam.  Patient was seen by Dr. Burr Medico from oncology who recommended hospital admission, liver ultrasound, MRI/MRCP if needed, and GI consult to see if he has obstructive jaundice and if he is a candidate for percutaneous biliary drainage versus stent placement.  Also recommended IV fluids and pamidronate for his hypercalcemia given his mental status change.  He received 2 L normal saline boluses and cefepime 2 g at the cancer center.  Patient was recently admitted from July 18, 2018 to July 27, 2018.  He was diagnosed with colon cancer with mets to the liver during this hospitalization.  In addition, treated for acute pancreatitis.  History provided by patient: Reports having right upper quadrant abdominal pain since this hospital discharge.  Pain is 10 out of 10 in intensity, intermittent.  Also reports having fevers and chills for the past few days.  He has been vomiting intermittently but still able to tolerate some p.o. intake.  He continues to take MiraLAX and is having daily bowel movements.  Reports having fatigue and wife at bedside states patient has been sleeping a lot.  Patient denies having any cough or shortness of breath.  Reports having  discomfort in his right upper quadrant if he takes a deep breath.    ED Course: Temperature 101 F, tachycardic, blood pressure stable, and satting well on room air.  White count 22,000.  Lactic acid 2.9.  Hemoglobin 8.8, baseline 10-11.  Corrected calcium 14.1.  AST 118, ALT 50, alk phos 248, and T bili 9.2.  LFTs have previously been elevated but now worse, especially T bili.  Chest x-ray showing no active cardiopulmonary disease.  Right upper quadrant ultrasound showing widespread and advanced hepatic metastatic disease as known by previous imaging studies and no evidence of acute cholecystitis.  CT abdomen pelvis showing mild worsening of diffuse liver metastasis and right lower quadrant mesenteric lymphadenopathy, ascending colon mass as previously described, nearly completely resolved pancreatic inflammation, left greater than right basilar lung opacities which may reflect pneumonia or atelectasis, and persistent small volume ascites.  Patient received Tylenol, morphine, Zosyn, and 1 L lactated Ringer's bolus in the ED.  [facility] paged to admit.  Review of Systems: As per HPI otherwise 10 point review of systems negative.  Past Medical History:  Diagnosis Date  . Hypertension     Past Surgical History:  Procedure Laterality Date  . BIOPSY  07/22/2018   Procedure: BIOPSY;  Surgeon: Wilford Corner, MD;  Location: [facility];  Service: Endoscopy;;  . COLONOSCOPY WITH PROPOFOL N/A 07/22/2018   Procedure: COLONOSCOPY WITH PROPOFOL;  Surgeon: Wilford Corner, MD;  Location: [facility];  Service: Endoscopy;  Laterality: N/A;  . SUBMUCOSAL INJECTION  07/22/2018   Procedure: SUBMUCOSAL INJECTION;  Surgeon: Wilford Corner, MD;  Location: [facility];  Service:  Endoscopy;;  tattoo     reports that he has never smoked. He has never used smokeless tobacco. He reports that he does not use drugs. His alcohol history is not on file.  Allergies  Allergen Reactions  . Ciprofloxacin Other (See  Comments)    Too strong   . Lisinopril Other (See Comments)    ED    Family History  Problem Relation Age of Onset  . Diabetes Mellitus II Mother   . Prostate cancer Father   . Colon cancer Maternal Grandmother     Prior to Admission medications   Medication Sig Start Date End Date Taking? Authorizing Provider  amLODipine (NORVASC) 10 MG tablet Take 1 tablet (10 mg total) by mouth daily. 07/27/18  Yes Eugenie Filler, MD  feeding supplement, ENSURE ENLIVE, (ENSURE ENLIVE) LIQD Take 237 mLs by mouth daily. 07/28/18  Yes Eugenie Filler, MD  loratadine (CLARITIN) 10 MG tablet Take 10 mg by mouth daily.   Yes [provider]  Multiple Vitamin (MULTIVITAMIN WITH MINERALS) TABS tablet Take 1 tablet by mouth daily.   Yes [provider]  omeprazole (PRILOSEC) 20 MG capsule Take 20 mg by mouth daily.   Yes [provider]  ondansetron (ZOFRAN) 8 MG tablet Take 1 tablet (8 mg total) by mouth every 8 (eight) hours as needed for nausea or vomiting. 07/27/18  Yes Eugenie Filler, MD  oxyCODONE (OXY IR/ROXICODONE) 5 MG immediate release tablet Take 1 tablet (5 mg total) by mouth every 4 (four) hours as needed for moderate pain. 07/27/18  Yes Eugenie Filler, MD  polyethylene glycol [facility] / Floria Raveling) packet Take 17 g by mouth daily as needed for moderate constipation. 07/27/18  Yes Eugenie Filler, MD  protein supplement shake (PREMIER PROTEIN) LIQD Take 325 mLs (11 oz total) by mouth 2 (two) times daily between meals. 07/27/18  Yes Eugenie Filler, MD  prochlorperazine (COMPAZINE) 10 MG tablet Take 1 tablet (10 mg total) by mouth every 6 (six) hours as needed (NAUSEA). 07/27/18   Eugenie Filler, MD    Physical Exam: Vitals:   07/31/18 2136 07/31/18 2253 07/31/18 2325 08/01/18 0000  BP: 135/76 134/78  134/73  Pulse: (!) 106 (!) 110  (!) 108  Resp: 20 (!) 27  (!) 28  Temp:      TempSrc:      SpO2: 95% 95%  95%  Weight:   85.2 kg   Height:   '5\' 11"'   (1.803 m)     Physical Exam  Constitutional: No distress.  HENT:  Head: Normocephalic.  Mouth/Throat: Oropharynx is clear and moist.  Eyes: Scleral icterus is present.  Neck: Neck supple. No tracheal deviation present.  Cardiovascular: Regular rhythm and intact distal pulses.  Tachycardic  Pulmonary/Chest: Effort normal and breath sounds normal. No respiratory distress. He has no wheezes. He has no rales.  Abdominal: Bowel sounds are normal. He exhibits distension. There is tenderness. There is no rebound and no guarding.  Right upper quadrant tender to palpation  Musculoskeletal: He exhibits no edema.  Neurological:  Awake, alert Oriented to person, place, and time  Skin: Skin is warm and dry. He is not diaphoretic.  Appears jaundiced  Psychiatric: His behavior is normal.     Labs on Admission: I have personally reviewed following labs and imaging studies  CBC: Recent Labs  Lab 07/25/18 0552 07/26/18 0546 07/27/18 0554 07/31/18 1007  WBC 15.9* 15.9* 15.3* 22.0*  NEUTROABS 11.9* 11.5*  --   --  HGB 10.5* 10.3* 10.1* 8.8*  HCT 32.7* 31.8* 30.9* 27.3*  MCV 85.8 84.6 84.2 83.7  PLT 339 319 360 093*   Basic Metabolic Panel: Recent Labs  Lab 07/25/18 0552 07/26/18 0546 07/27/18 0554 07/31/18 1431 07/31/18 1817  NA 132* 132* 129* 136 138  K 4.9 4.6 4.4 5.3* 5.0  CL 101 101 97* 103 105  CO2 '23 22 24 25 24  ' GLUCOSE 86 95 89 89 93  BUN '17 14 16 ' 31* 32*  CREATININE 1.11 1.03 1.08 1.37* 1.21  CALCIUM 10.5* 10.7* 11.4* 12.4* 12.1*  MG 2.2 2.2 2.3  --   --   PHOS 1.8*  --   --   --   --    GFR: Estimated Creatinine Clearance: 78.7 mL/min (by C-G formula based on SCr of 1.21 mg/dL). Liver Function Tests: Recent Labs  Lab 07/25/18 0552 07/26/18 0546 07/31/18 1431  AST 75* 74* 118*  ALT 43 42 50*  ALKPHOS 255* 233* 248*  BILITOT 2.5* 3.8* 9.2*  PROT 6.3* 6.1* 6.5  ALBUMIN 2.2* 2.4* 1.9*   Recent Labs  Lab 07/25/18 0552 07/31/18 1818  LIPASE 119* 52*    Recent Labs  Lab 07/31/18 1444  AMMONIA 24   Coagulation Profile: No results for input(s): INR, PROTIME in the last 168 hours. Cardiac Enzymes: No results for input(s): CKTOTAL, CKMB, CKMBINDEX, TROPONINI in the last 168 hours. BNP (last 3 results) No results for input(s): PROBNP in the last 8760 hours. HbA1C: No results for input(s): HGBA1C in the last 72 hours. CBG: No results for input(s): GLUCAP in the last 168 hours. Lipid Profile: No results for input(s): CHOL, HDL, LDLCALC, TRIG, CHOLHDL, LDLDIRECT in the last 72 hours. Thyroid Function Tests: No results for input(s): TSH, T4TOTAL, FREET4, T3FREE, THYROIDAB in the last 72 hours. Anemia Panel: No results for input(s): VITAMINB12, FOLATE, FERRITIN, TIBC, IRON, RETICCTPCT in the last 72 hours. Urine analysis:    Component Value Date/Time   COLORURINE AMBER (A) 07/31/2018 1750   APPEARANCEUR HAZY (A) 07/31/2018 1750   LABSPEC 1.020 07/31/2018 1750   PHURINE 5.0 07/31/2018 1750   GLUCOSEU NEGATIVE 07/31/2018 1750   HGBUR SMALL (A) 07/31/2018 1750   BILIRUBINUR SMALL (A) 07/31/2018 1750   KETONESUR NEGATIVE 07/31/2018 1750   PROTEINUR NEGATIVE 07/31/2018 1750   NITRITE NEGATIVE 07/31/2018 1750   LEUKOCYTESUR NEGATIVE 07/31/2018 1750    Radiological Exams on Admission: Dg Chest 2 View  Result Date: 07/31/2018 CLINICAL DATA:  Fever.  Liver metastasis. EXAM: CHEST - 2 VIEW COMPARISON:  None. FINDINGS: The heart size and mediastinal contours are within normal limits. Both lungs are clear. The lung volumes are low. The visualized skeletal structures are unremarkable. IMPRESSION: No active cardiopulmonary disease. Electronically Signed   By: Abelardo Diesel M.D.   On: 07/31/2018 18:43   Ct Abdomen Pelvis W Contrast  Result Date: 07/31/2018 CLINICAL DATA:  Abdominal distension. Hypotension and tachycardia. Elevated bilirubin. History of metastatic colon cancer. EXAM: CT ABDOMEN AND PELVIS WITH CONTRAST TECHNIQUE: Multidetector  CT imaging of the abdomen and pelvis was performed using the standard protocol following bolus administration of intravenous contrast. CONTRAST:  152m ISOVUE-300 IOPAMIDOL (ISOVUE-300) INJECTION 61% COMPARISON:  Abdominal MRI 07/23/2018. CT abdomen and pelvis 07/18/2018. FINDINGS: Lower chest: Consolidative opacities in the left greater than right lower lobes and lingula. No pleural effusion. Hepatobiliary: Numerous hypoattenuating lesions are again seen throughout the liver with some appearing mildly larger than on the prior studies (for example a 4.3 cm segment IVa lesion on  series 3, image 33 which measured 3.7 cm on the prior CT). Layering hyperattenuating material in the gallbladder, possibly sludge. Possible mild gallbladder wall thickening. No biliary dilatation. Pancreas: Nearly completely resolved peripancreatic inflammation. No pancreatic ductal dilatation. Spleen: Unremarkable. Adrenals/Urinary Tract: Unremarkable adrenal glands. No evidence of renal mass, calculi, or hydronephrosis. Unremarkable bladder. Stomach/Bowel: Masslike wall thickening in the ascending colon as previously seen. No bowel obstruction. Unremarkable appendix. Vascular/Lymphatic: Normal caliber of the abdominal aorta. 3.9 x 2.8 cm right lower quadrant mesenteric lymph node, mildly enlarged from the prior CT (previously 3.2 x 2.5 cm). Reproductive: Unremarkable prostate. Other: Small volume ascites. No pneumoperitoneum. Small fat containing umbilical hernia. Musculoskeletal: No suspicious osseous lesion. Severe L5-S1 disc space narrowing with vacuum disc and degenerative endplate sclerosis as well as asymmetric left neural foraminal stenosis. IMPRESSION: 1. Mild worsening of diffuse liver metastases and right lower quadrant mesenteric lymphadenopathy. 2. Ascending colon mass as previously described. 3. Nearly completely resolved pancreatic inflammation. 4. Left greater than right basilar lung opacities which may reflect pneumonia or  atelectasis. 5. Persistent small volume ascites. Electronically Signed   By: Logan Bores M.D.   On: 07/31/2018 19:54   US Abdomen Limited Ruq  Result Date: 07/31/2018 CLINICAL DATA:  Worsened pain, jaundice and elevated white count. Metastatic colon cancer. EXAM: ULTRASOUND ABDOMEN LIMITED RIGHT UPPER QUADRANT COMPARISON:  Multiple CT and MR studies of the last 2 weeks. FINDINGS: Gallbladder: Gallbladder contains sludge but no shadowing stones. No Murphy sign. Maximal wall thickness 3 mm. No surrounding fluid. Common bile duct: Diameter: 6 mm Liver: Innumerable metastatic lesions throughout the liver. Previously evaluated by CT. Portal vein is patent on color Doppler imaging with normal direction of blood flow towards the liver. IMPRESSION: Widespread and advanced hepatic metastatic disease as known by previous imaging studies. The gallbladder contains sludge but no shadowing stones. There is no Murphy sign. Borderline gallbladder wall thickness but no surrounding fluid. The overall balance of the information argues against cholecystitis. Electronically Signed   By: Nelson Chimes M.D.   On: 07/31/2018 20:43    Assessment/Plan Principal Problem:   Bilirubinemia Active Problems:   Hypercalcemia   Cancer of right colon (HCC)   Sepsis (HCC)   Chronic anemia   Thrombocytosis (HCC)   AKI (acute kidney injury) ([place])   GERD (gastroesophageal reflux disease)   Protein-calorie malnutrition, severe (HCC)  Hyperbilirubinemia, sepsis  -Differentials include obstructive jaundice versus reactive hepatopathy  -Febrile (101 F), tachycardic, blood pressure stable. White count 22,000.  Lactic acid 2.9, now improved to 2.7 with fluid resuscitation. AST 118, ALT 50, alk phos 248, and T bili 9.2.  LFTs have previously been elevated but now worse, especially T bili (3.8 six days ago).  Ammonia normal.  He does not appear confused. -Right upper quadrant ultrasound without evidence of acute cholecystitis.  Patient  was recently admitted for acute pancreatitis but CT abdomen pelvis showing nearly completely resolved pancreatic inflammation.  Lipase 52. -Other source of infection less likely as UA not suggestive of infection and chest x-ray negative for pneumonia.  CT chest showing left greater than right basilar lung opacities which may reflect pneumonia or atelectasis.  No cough or shortness of breath; patient is satting well on room air. -I spoke to Dr. Cristina Gong from [place].  He thinks obstructive jaundice is less likely as patient's common bile duct is 6 mm and only borderline dilated on the right upper quadrant ultrasound.  He believes the patient's current presentation could be due to reactive hepatopathy,  representing a cholestatic picture in the setting of severe medical illness.  GI will see the patient in the morning. -IV fluid resuscitation -Continue Zosyn.  Procalcitonin 8.6. -Keep n.p.o. -Repeat CBC in a.m. -Hepatic function panel -Continue to trend lactate -Fentanyl 25 mg every 2 hours as needed for severe pain -IV Zofran PRN nausea -Blood culture x2 pending -Urine culture pending -Monitor in stepdown unit  Severe hypercalcemia of malignancy Corrected calcium 14.1. -IV pamidronate as recommended by Dr. Burr Medico -Continue IV fluid resuscitation -Check ionized calcium level -Repeat BMP in a.m.  Recently diagnosed colon cancer with mets to liver -CT abdomen pelvis showing mild worsening of diffuse liver metastasis and right lower quadrant mesenteric lymphadenopathy, ascending colon mass as previously described, and persistent small volume ascites. -Oncology (Dr. Burr Medico) will see the patient in the morning  Chronic anemia Hemoglobin 8.8, baseline 10-11.  No signs of active bleeding. -Continue to monitor CBC  Mild thrombocytosis Platelet count 432,000, previously normal.  Likely related to hemoconcentration from dehydration.  -Repeat CBC in a.m. to confirm  Mild AKI Creatinine 1.3, baseline  1.0. -Continue IV fluid resuscitation -Avoid nephrotoxic agents/contrast -Repeat BMP in a.m.  Hypertension -Hold home meds in the setting of sepsis  GERD -Continue home Protonix  Severe protein calorie malnutrition Albumin 1.9. -Nutrition consult  DVT prophylaxis: SCDs Code Status: Patient wishes to be full code. Family Communication: Wife at bedside updated. Disposition Plan: Anticipate discharge to home in 1 to 2 days. Consults called: Oncology (Dr. Burr Medico), GI (Dr. Cristina Gong) Admission status: It is my clinical opinion that admission to INPATIENT is reasonable and necessary in this 52 y.o. male . presenting with symptoms of fever, fatigue, jaundice, abdominal pain, concerning for sepsis, hyperbilirubinemia secondary to obstructive jaundice versus reactive hepatopathy . in the context of PMH including: Colon cancer with mets to liver . with pertinent positives on physical exam including: Fever, tachycardia, jaundice, right upper quadrant abdominal pain . and pertinent positives on radiographic and laboratory data including: Leukocytosis, lactic acidosis, elevated LFTs/bilirubin . Workup and treatment include IV antibiotic, IV fluid, pending GI and oncology evaluation.  Given the aforementioned, the predictability of an adverse outcome is felt to be significant. I expect that the patient will require at least 2 midnights in the hospital to treat this condition.    Shela Leff MD [name] Pager [phone]  If 7PM-7AM, please contact night-coverage [email or web address] Password [email or web address]  08/01/2018, 3:04 AM

## 2018-07-31 NOTE — H&P (Addendum)
Chief Complaint: Patient was seen in consultation today for port placement at the request of Feng,Yan  Referring Physician(s): Feng,Yan  Supervising Physician: Marybelle Killings  Patient Status: [facility] - Out-pt  History of Present Illness: Jeffery Roman is a 52 y.o. male with colon cancer with mets to the liver. He was recently hospitalized with abd pain and pancreatitis and the cancer was also found, confirmed after liver lesion biopsy. He has been seen by Oncology, who now requests port placement to start chemotherapy later this week. Pt still reports some abd pain. Intermittent low grade fevers. States difficult to take deep breaths due to the abd discomfort Denies N/V, diarrhea.   Past Medical History:  Diagnosis Date  . Hypertension     Past Surgical History:  Procedure Laterality Date  . BIOPSY  07/22/2018   Procedure: BIOPSY;  Surgeon: Wilford Corner, MD;  Location: [facility];  Service: Endoscopy;;  . COLONOSCOPY WITH PROPOFOL N/A 07/22/2018   Procedure: COLONOSCOPY WITH PROPOFOL;  Surgeon: Wilford Corner, MD;  Location: [facility];  Service: Endoscopy;  Laterality: N/A;  . SUBMUCOSAL INJECTION  07/22/2018   Procedure: SUBMUCOSAL INJECTION;  Surgeon: Wilford Corner, MD;  Location: [facility];  Service: Endoscopy;;  tattoo    Allergies: Ciprofloxacin and Lisinopril  Medications: Prior to Admission medications   Medication Sig Start Date End Date Taking? Authorizing Provider  amLODipine (NORVASC) 10 MG tablet Take 1 tablet (10 mg total) by mouth daily. 07/27/18   Eugenie Filler, MD  feeding supplement, ENSURE ENLIVE, (ENSURE ENLIVE) LIQD Take 237 mLs by mouth daily. 07/28/18   Eugenie Filler, MD  loratadine (CLARITIN) 10 MG tablet Take 10 mg by mouth daily.    [provider]  Multiple Vitamin (MULTIVITAMIN WITH MINERALS) TABS tablet Take 1 tablet by mouth daily.    [provider]  omeprazole (PRILOSEC) 20 MG capsule Take 20 mg  by mouth daily.    [provider]  ondansetron (ZOFRAN) 8 MG tablet Take 1 tablet (8 mg total) by mouth every 8 (eight) hours as needed for nausea or vomiting. 07/27/18   Eugenie Filler, MD  oxyCODONE (OXY IR/ROXICODONE) 5 MG immediate release tablet Take 1 tablet (5 mg total) by mouth every 4 (four) hours as needed for moderate pain. 07/27/18   Eugenie Filler, MD  Oxygen Permeable Lens Products (RENU REWETTING DROPS) SOLN Place 1 drop into both eyes daily as needed (dry eyes).    [provider]  polyethylene glycol (MIRALAX / GLYCOLAX) packet Take 17 g by mouth daily as needed for moderate constipation. 07/27/18   Eugenie Filler, MD  prochlorperazine (COMPAZINE) 10 MG tablet Take 1 tablet (10 mg total) by mouth every 6 (six) hours as needed (NAUSEA). 07/27/18   Eugenie Filler, MD  protein supplement shake (PREMIER PROTEIN) LIQD Take 325 mLs (11 oz total) by mouth 2 (two) times daily between meals. 07/27/18   Eugenie Filler, MD     Family History  Problem Relation Age of Onset  . Diabetes Mellitus II Mother   . Prostate cancer Father   . Colon cancer Maternal Grandmother     Social History   Socioeconomic History  . Marital status: Married    Spouse name: Not on file  . Number of children: Not on file  . Years of education: Not on file  . Highest education level: Not on file  Occupational History  . Not on file  Social Needs  . Financial resource strain: Not  on file  . Food insecurity:    Worry: Not on file    Inability: Not on file  . Transportation needs:    Medical: Not on file    Non-medical: Not on file  Tobacco Use  . Smoking status: Never Smoker  . Smokeless tobacco: Never Used  Substance and Sexual Activity  . Alcohol use: Not on file  . Drug use: Not on file  . Sexual activity: Not on file  Lifestyle  . Physical activity:    Days per week: Not on file    Minutes per session: Not on file  . Stress: Not on file  Relationships  .  Social connections:    Talks on phone: Not on file    Gets together: Not on file    Attends religious service: Not on file    Active member of club or organization: Not on file    Attends meetings of clubs or organizations: Not on file    Relationship status: Not on file  Other Topics Concern  . Not on file  Social History Narrative  . Not on file     Review of Systems: A 12 point ROS discussed and pertinent positives are indicated in the HPI above.  All other systems are negative.  Review of Systems  Vital Signs: BP 139/77   Pulse (!) 124   Temp 99.6 F (37.6 C) (Oral)   Resp 18   SpO2 97%   Physical Exam  Constitutional: He is oriented to person, place, and time. He appears well-developed. No distress.  HENT:  Head: Normocephalic.  Neck: Normal range of motion. No JVD present. No tracheal deviation present.  Cardiovascular: Normal rate, regular rhythm and normal heart sounds.  Pulmonary/Chest: Effort normal and breath sounds normal. No respiratory distress.  Abdominal: Soft. He exhibits no distension. There is tenderness. There is no guarding.  Neurological: He is alert and oriented to person, place, and time.  Skin: Skin is warm and dry.  Psychiatric: He has a normal mood and affect.    Imaging: Dg Abd 1 View - Kub  Result Date: 07/20/2018 CLINICAL DATA:  Evaluate for free peritoneal air. EXAM: ABDOMEN - 1 VIEW COMPARISON:  CT 07/18/2018 FINDINGS: Moderate air and contrast throughout the colon. No definite free peritoneal air. Remainder the exam is unremarkable. IMPRESSION: Nonspecific, nonobstructive bowel gas pattern. No definite free peritoneal air. Recommend upright KUB with diaphragms in field of view versus right lateral decubitus film for better evaluation for free air. Electronically Signed   By: Marin Olp M.D.   On: 07/20/2018 11:03   Ct Chest W Contrast  Result Date: 07/23/2018 CLINICAL DATA:  Inpatient admitted with abdominal pain, pancreatitis and  weight loss. Malignant-appearing partially obstructing tumor in the ascending colon biopsied 1 day prior at colonoscopy, pathology pending. Multiple liver masses suspicious for liver metastases at CT. Chest staging. EXAM: CT CHEST WITH CONTRAST TECHNIQUE: Multidetector CT imaging of the chest was performed during intravenous contrast administration. CONTRAST:  28mL OMNIPAQUE IOHEXOL 300 MG/ML  SOLN COMPARISON:  07/18/2018 CT abdomen/pelvis. FINDINGS: Cardiovascular: Normal heart size. No significant pericardial effusion/thickening. Great vessels are normal in course and caliber. No central pulmonary emboli. Mediastinum/Nodes: No discrete thyroid nodules. Unremarkable esophagus. No pathologically enlarged axillary, mediastinal or hilar lymph nodes. Lungs/Pleura: No pneumothorax. Small dependent bilateral pleural effusions. Mild-to-moderate compressive atelectasis in the dependent lower lobes, left greater than right. No acute consolidative airspace disease, lung masses or significant pulmonary nodules. Upper abdomen: Numerous hypodense liver masses scattered  throughout the visualized liver, unchanged from recent CT. Musculoskeletal:  No aggressive appearing focal osseous lesions. IMPRESSION: 1. No findings suspicious for metastatic disease in the chest. 2. Small dependent bilateral pleural effusions with associated dependent lower lobe atelectasis. 3. Redemonstration of innumerable hypodense liver masses suspicious for liver metastases. Electronically Signed   By: Ilona Sorrel M.D.   On: 07/23/2018 13:55   Nm Bone Scan Whole Body  Result Date: 07/25/2018 CLINICAL DATA:  History of colon cancer. EXAM: NUCLEAR MEDICINE WHOLE BODY BONE SCAN TECHNIQUE: Whole body anterior and posterior images were obtained approximately 3 hours after intravenous injection of radiopharmaceutical. RADIOPHARMACEUTICALS:  20.1 mCi Technetium-10m MDP IV COMPARISON:  PET scan of July 23, 2018. FINDINGS: Symmetric uptake is seen  involving both shoulder regions suggesting degenerative change. No other abnormal uptake is noted. IMPRESSION: No scintigraphic evidence of osseous metastases. Electronically Signed   By: Marijo Conception, M.D.   On: 07/25/2018 14:15   Ct Abdomen Pelvis W Contrast  Addendum Date: 07/23/2018   ADDENDUM REPORT: 07/23/2018 15:19 ADDENDUM: RIGHT lower quadrant 3.2 cm solid nodule (axial 69/112) consistent with metastatic lymphadenopathy. Electronically Signed   By: Elon Alas M.D.   On: 07/23/2018 15:19   Result Date: 07/23/2018 CLINICAL DATA:  Worsening epigastric pain for a week, vomiting today. On treatment for prostatitis. EXAM: CT ABDOMEN AND PELVIS WITH CONTRAST TECHNIQUE: Multidetector CT imaging of the abdomen and pelvis was performed using the standard protocol following bolus administration of intravenous contrast. CONTRAST:  122mL ISOVUE-300 IOPAMIDOL (ISOVUE-300) INJECTION 61% COMPARISON:  CT pelvis December 08, 2014 FINDINGS: LOWER CHEST: No pericardial effusions. Lower lobe atelectasis. Heart size is normal HEPATOBILIARY: Numerous rounded hypodensities throughout the liver measuring to 3.7 cm. Liver size is normal. Normal gallbladder. PANCREAS: Peripancreatic inflammation without parenchymal hypodensity, ductal dilatation, abnormal calcifications or mass. SPLEEN: Normal. ADRENALS/URINARY TRACT: Kidneys are orthotopic, demonstrating symmetric enhancement. No nephrolithiasis, hydronephrosis or solid renal masses. The unopacified ureters are normal in course and caliber. Delayed imaging through the kidneys demonstrates symmetric prompt contrast excretion within the proximal urinary collecting system. Urinary bladder is partially distended and unremarkable. Normal adrenal glands. STOMACH/BOWEL: Eccentric posterior thickening of the ascending colon (axial image 61). The stomach, small bowel are normal in course and caliber without inflammatory changes. Normal appendix. VASCULAR/LYMPHATIC: Aortoiliac  vessels are normal in course and caliber. No lymphadenopathy by CT size criteria. REPRODUCTIVE: Normal. OTHER: No intraperitoneal free fluid or free air. Small amount of ascites predominantly in the LEFT upper quadrant and LEFT pericolic gutter. No intraperitoneal free air or focal fluid collection. MUSCULOSKELETAL: Nonacute. Severe L5-S1 disc height loss with endplate sclerosis and vacuum disc compatible with degenerative disc resulting in severe LEFT L5-S1 neural foraminal narrowing. IMPRESSION: 1. Acute pancreatitis without necrosis. 2. Eccentric ascending colon wall thickening concerning for primary neoplasm. Multiple hepatic metastasis. Recommend colonoscopy and/or PET-CT on non emergent basis. 3. Acute findings discussed with and reconfirmed by Dr.MOLPUS on 07/18/2018 at 11:42 pm. Electronically Signed: By: Elon Alas M.D. On: 07/18/2018 23:45   Mr 3d Recon At Scanner  Result Date: 07/23/2018 CLINICAL DATA:  Pancreatic adenocarcinoma. EXAM: MRI ABDOMEN WITHOUT AND WITH CONTRAST (INCLUDING MRCP) TECHNIQUE: Multiplanar multisequence MR imaging of the abdomen was performed both before and after the administration of intravenous contrast. Heavily T2-weighted images of the biliary and pancreatic ducts were obtained, and three-dimensional MRCP images were rendered by post processing. CONTRAST:  9 cc of Gadavist COMPARISON:  CT AP 07/18/2018 FINDINGS: Lower chest: There are small to moderate bilateral pleural effusions  identified. Hepatobiliary: Innumerable peripheral enhancing lesions are identified throughout both lobes of liver compatible with metastatic disease. -index lesion within segment 2 measures 2.9 cm, image 19/3. -Index lesion within segment 4 a measures 3.9 cm, image 21/3. -Index lesion within segment 8 measures 3.8 cm, image number 20/3. -index segment 6 lesion measures 3.4 cm, image 46/3. The gallbladder appears unremarkable.  No biliary dilatation. Pancreas: Mild diffuse pancreatic edema  noted. No discrete mass identified. No main duct dilatation. Spleen:  Within normal limits in size and appearance. Adrenals/Urinary Tract: Normal appearance of the adrenal glands. No kidney mass or hydronephrosis identified. Stomach/Bowel: No abnormal bowel dilatation identified. Right colon mass is noted measuring 4.3 cm. Vascular/Lymphatic: Normal appearance of the abdominal aorta. No aneurysm. Within the right lower quadrant of the abdomen there is and enlarged enhancing lymph node measuring 2.7 cm, image 35/12. Other:  None Musculoskeletal: No suspicious bone lesions identified. IMPRESSION: 1. There are innumerable peripherally enhancing liver lesions most concerning for diffuse metastatic disease. 2. Mild inflammatory changes of the pancreas compatible with pancreatitis. No complications identified. 3. Ascending colon mass concerning for primary colonic neoplasm. A soft tissue nodule is noted within the right lower quadrant of the abdomen suspicious for ileocolic mesenteric adenopathy. 4. Bilateral pleural effusions. Electronically Signed   By: Kerby Moors M.D.   On: 07/23/2018 11:35   US Biopsy (liver)  Result Date: 07/24/2018 CLINICAL DATA:  Innumerable masses throughout the liver and ascending colonic mass. EXAM: ULTRASOUND GUIDED CORE BIOPSY OF LIVER MEDICATIONS: 3.0 mg IV Versed; 100 mcg IV Fentanyl Total Moderate Sedation Time: 10 minutes. The patient's level of consciousness and physiologic status were continuously monitored during the procedure by Radiology nursing. PROCEDURE: The procedure, risks, benefits, and alternatives were explained to the patient. Questions regarding the procedure were encouraged and answered. The patient understands and consents to the procedure. A time out was performed prior to initiating the procedure. Ultrasound was performed to localize liver lesions. The abdominal wall was prepped with chlorhexidine in a sterile fashion, and a sterile drape was applied covering  the operative field. A sterile gown and sterile gloves were used for the procedure. Local anesthesia was provided with 1% Lidocaine. A 17 gauge needle was advanced into the left lobe. Three separate 18 gauge coaxial core biopsy samples were obtained and submitted in formalin. Gel-Foam pledgets were advanced through the outer needle as the needle was retracted. Additional ultrasound was performed. COMPLICATIONS: None. FINDINGS: Multiple masses are seen throughout the liver parenchyma. An area in the left lobe was chosen for biopsy of confluent masses. Solid tissue was obtained. IMPRESSION: Ultrasound-guided core biopsy performed at the level of masses within the left lobe of the liver. Electronically Signed   By: Aletta Edouard M.D.   On: 07/24/2018 15:30   Mr Abdomen Mrcp W MO Contast  Result Date: 07/23/2018 CLINICAL DATA:  Pancreatic adenocarcinoma. EXAM: MRI ABDOMEN WITHOUT AND WITH CONTRAST (INCLUDING MRCP) TECHNIQUE: Multiplanar multisequence MR imaging of the abdomen was performed both before and after the administration of intravenous contrast. Heavily T2-weighted images of the biliary and pancreatic ducts were obtained, and three-dimensional MRCP images were rendered by post processing. CONTRAST:  9 cc of Gadavist COMPARISON:  CT AP 07/18/2018 FINDINGS: Lower chest: There are small to moderate bilateral pleural effusions identified. Hepatobiliary: Innumerable peripheral enhancing lesions are identified throughout both lobes of liver compatible with metastatic disease. -index lesion within segment 2 measures 2.9 cm, image 19/3. -Index lesion within segment 4 a measures 3.9 cm, image 21/3. -  Index lesion within segment 8 measures 3.8 cm, image number 20/3. -index segment 6 lesion measures 3.4 cm, image 46/3. The gallbladder appears unremarkable.  No biliary dilatation. Pancreas: Mild diffuse pancreatic edema noted. No discrete mass identified. No main duct dilatation. Spleen:  Within normal limits in size  and appearance. Adrenals/Urinary Tract: Normal appearance of the adrenal glands. No kidney mass or hydronephrosis identified. Stomach/Bowel: No abnormal bowel dilatation identified. Right colon mass is noted measuring 4.3 cm. Vascular/Lymphatic: Normal appearance of the abdominal aorta. No aneurysm. Within the right lower quadrant of the abdomen there is and enlarged enhancing lymph node measuring 2.7 cm, image 35/12. Other:  None Musculoskeletal: No suspicious bone lesions identified. IMPRESSION: 1. There are innumerable peripherally enhancing liver lesions most concerning for diffuse metastatic disease. 2. Mild inflammatory changes of the pancreas compatible with pancreatitis. No complications identified. 3. Ascending colon mass concerning for primary colonic neoplasm. A soft tissue nodule is noted within the right lower quadrant of the abdomen suspicious for ileocolic mesenteric adenopathy. 4. Bilateral pleural effusions. Electronically Signed   By: Kerby Moors M.D.   On: 07/23/2018 11:35    Labs:  CBC: Recent Labs    07/25/18 0552 07/26/18 0546 07/27/18 0554 07/31/18 1007  WBC 15.9* 15.9* 15.3* 22.0*  HGB 10.5* 10.3* 10.1* 8.8*  HCT 32.7* 31.8* 30.9* 27.3*  PLT 339 319 360 432*    COAGS: Recent Labs    07/24/18 0856  INR 1.04    BMP: Recent Labs    07/24/18 0606 07/25/18 0552 07/26/18 0546 07/27/18 0554  NA 135 132* 132* 129*  K 4.5 4.9 4.6 4.4  CL 102 101 101 97*  CO2 24 23 22 24   GLUCOSE 80 86 95 89  BUN 22* 17 14 16   CALCIUM 10.2 10.5* 10.7* 11.4*  CREATININE 1.28* 1.11 1.03 1.08  GFRNONAA >60 >60 >60 >60  GFRAA >60 >60 >60 >60    LIVER FUNCTION TESTS: Recent Labs    07/20/18 1219 07/24/18 0606 07/25/18 0552 07/26/18 0546  BILITOT 2.4* 1.4* 2.5* 3.8*  AST 69* 71* 75* 74*  ALT 36 46* 43 42  ALKPHOS 262* 283* 255* 233*  PROT 6.0* 6.7 6.3* 6.1*  ALBUMIN 2.5* 2.6* 2.2* 2.4*    TUMOR MARKERS: No results for input(s): AFPTM, CEA, CA199, CHROMGRNA in the  last 8760 hours.  Assessment and Plan: Metastatic colon cancer to the liver. Temp 99.6. WBC now up to 22k. Would not recommend port placement at this time due to concern of ongoing infectious problem. Will d/w Dr. Burr Medico   Thank you for this interesting consult.  I greatly enjoyed meeting Jeffery Roman and look forward to participating in their care.  A copy of this report was sent to the requesting provider on this date.  Electronically Signed: Ascencion Dike, PA-C 07/31/2018, 10:37 AM   I spent a total of 20 minutes in face to face in clinical consultation, greater than 50% of which was counseling/coordinating care for port placement

## 2018-07-31 NOTE — ED Notes (Signed)
Bed: UD14 Expected date:  Expected time:  Means of arrival:  Comments: 52yo from cancer center m metastatic colon CA, bili 9.2

## 2018-07-31 NOTE — ED Notes (Signed)
I gave critical I stat CG4 result to MD [facility]

## 2018-07-31 NOTE — ED Triage Notes (Signed)
Per Cancer center staff: Pt has liver cancer with mets. Pt's bilrubin 3 days ago was 3.8.  Pt has been hypotensive and tachycardic. Pt had a liter of fluid with cancer center. PT also has had 2g of cefepime. Pt was noted today to have a bilirubin of 9.4.  PT has a reported white count of 22k. Pt is short of breath with exertion. Pt reported he had been using incentive spirometer the last few days but it has been more difficult.  Dr. Burr Medico reports he will be seeing him inpatient

## 2018-07-31 NOTE — ED Provider Notes (Signed)
[facility] DEPT Provider Note   CSN: [id number] Arrival date & time: 07/31/18  1705     History   Chief Complaint Chief Complaint  Patient presents with  . Shortness of Breath  . Cancer    HPI Jeffery Roman is a 52 y.o. male.  The history is provided by the patient.  Fever   This is a new problem. The current episode started 3 to 5 hours ago. The problem occurs constantly. The problem has not changed since onset.The maximum temperature noted was 100 to 100.9 F. Pertinent negatives include no chest pain, no fussiness, no sleepiness, no diarrhea, no vomiting, no congestion, no headaches, no sore throat, no tugging at ear, no muscle aches and no cough. He has tried nothing for the symptoms. The treatment provided no relief.    Past Medical History:  Diagnosis Date  . Hypertension     Patient Active Problem List   Diagnosis Date Noted  . Bilirubinemia 07/31/2018  . Cancer of right colon ([place]) 07/25/2018  . Goals of care, counseling/discussion 07/25/2018  . Liver metastasis ([place])   . Metastatic cancer ([place])   . 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 Colon obstruction - partial - from ascending colon tumor 07/21/2018  . Acute pancreatitis 07/19/2018  . Hypercalcemia 07/19/2018  . Mass of ascending colon - probable cancer 07/19/2018  . ARF (acute renal failure) ([place]) 07/19/2018  . Essential hypertension 07/19/2018    Past Surgical History:  Procedure Laterality Date  . BIOPSY  07/22/2018   Procedure: BIOPSY;  Surgeon: Wilford Corner, MD;  Location: [facility] ENDOSCOPY;  Service: Endoscopy;;  . COLONOSCOPY WITH PROPOFOL N/A 07/22/2018   Procedure: COLONOSCOPY WITH PROPOFOL;  Surgeon: Wilford Corner, MD;  Location: [facility] ENDOSCOPY;  Service: Endoscopy;  Laterality: N/A;  . SUBMUCOSAL INJECTION  07/22/2018   Procedure: SUBMUCOSAL INJECTION;  Surgeon: Wilford Corner, MD;  Location: [facility] ENDOSCOPY;  Service: Endoscopy;;   tattoo        Home Medications    Prior to Admission medications   Medication Sig Start Date End Date Taking? Authorizing Provider  amLODipine (NORVASC) 10 MG tablet Take 1 tablet (10 mg total) by mouth daily. 07/27/18  Yes Eugenie Filler, MD  feeding supplement, ENSURE ENLIVE, (ENSURE ENLIVE) LIQD Take 237 mLs by mouth daily. 07/28/18  Yes Eugenie Filler, MD  loratadine (CLARITIN) 10 MG tablet Take 10 mg by mouth daily.   Yes [provider]  Multiple Vitamin (MULTIVITAMIN WITH MINERALS) TABS tablet Take 1 tablet by mouth daily.   Yes [provider]  omeprazole (PRILOSEC) 20 MG capsule Take 20 mg by mouth daily.   Yes [provider]  ondansetron (ZOFRAN) 8 MG tablet Take 1 tablet (8 mg total) by mouth every 8 (eight) hours as needed for nausea or vomiting. 07/27/18  Yes Eugenie Filler, MD  oxyCODONE (OXY IR/ROXICODONE) 5 MG immediate release tablet Take 1 tablet (5 mg total) by mouth every 4 (four) hours as needed for moderate pain. 07/27/18  Yes Eugenie Filler, MD  polyethylene glycol [facility]aveling) packet Take 17 g by mouth daily as needed for moderate constipation. 07/27/18  Yes Eugenie Filler, MD  protein supplement shake (PREMIER PROTEIN) LIQD Take 325 mLs (11 oz total) by mouth 2 (two) times daily between meals. 07/27/18  Yes Eugenie Filler, MD  prochlorperazine (COMPAZINE) 10 MG tablet Take 1 tablet (10 mg total) by mouth every 6 (six) hours as  needed (NAUSEA). 07/27/18   Eugenie Filler, MD    Family History Family History  Problem Relation Age of Onset  . Diabetes Mellitus II Mother   . Prostate cancer Father   . Colon cancer Maternal Grandmother     Social History Social History   Tobacco Use  . Smoking status: Never Smoker  . Smokeless tobacco: Never Used  Substance Use Topics  . Alcohol use: Not on file    Comment: twice per year  . Drug use: Never     Allergies   Ciprofloxacin and Lisinopril   Review  of Systems Review of Systems  Constitutional: Positive for fever. Negative for chills.  HENT: Negative for congestion, ear pain and sore throat.   Eyes: Negative for pain and visual disturbance.  Respiratory: Negative for cough and shortness of breath.   Cardiovascular: Negative for chest pain and palpitations.  Gastrointestinal: Positive for abdominal distention and abdominal pain. Negative for diarrhea and vomiting.  Genitourinary: Negative for dysuria and hematuria.  Musculoskeletal: Negative for arthralgias and back pain.  Skin: Negative for color change and rash.  Neurological: Negative for seizures, syncope and headaches.  All other systems reviewed and are negative.    Physical Exam Updated Vital Signs BP 134/78   Pulse (!) 110   Temp (!) 101 F (38.3 C) (Rectal)   Resp (!) 27   SpO2 95%   Physical Exam  Constitutional: He appears well-developed and well-nourished.  HENT:  Head: Normocephalic and atraumatic.  Eyes: Pupils are equal, round, and reactive to light. Conjunctivae and EOM are normal.  Scleral icterus  Neck: Normal range of motion. Neck supple.  Cardiovascular: Regular rhythm, normal heart sounds and intact distal pulses. Tachycardia present.  No murmur heard. Pulmonary/Chest: Effort normal and breath sounds normal. No respiratory distress. He has no decreased breath sounds. He has no wheezes.  Abdominal: Soft. There is tenderness (RUQ, epigastric). There is guarding.  Musculoskeletal: He exhibits no edema.  Neurological: He is alert.  Skin: Skin is warm and dry. Capillary refill takes less than 2 seconds.  Psychiatric: He has a normal mood and affect.  Nursing note and vitals reviewed.    ED Treatments / Results  Labs (all labs ordered are listed, but only abnormal results are displayed) Labs Reviewed  LIPASE, BLOOD - Abnormal; Notable for the following components:      Result Value   Lipase 52 (*)    All other components within normal limits    URINALYSIS, ROUTINE W REFLEX MICROSCOPIC - Abnormal; Notable for the following components:   Color, Urine AMBER (*)    APPearance HAZY (*)    Hgb urine dipstick SMALL (*)    Bilirubin Urine SMALL (*)    Bacteria, UA RARE (*)    All other components within normal limits  I-STAT CG4 LACTIC ACID, ED - Abnormal; Notable for the following components:   Lactic Acid, Venous 2.98 (*)    All other components within normal limits  I-STAT CG4 LACTIC ACID, ED - Abnormal; Notable for the following components:   Lactic Acid, Venous 2.75 (*)    All other components within normal limits  URINE CULTURE  CBC  BASIC METABOLIC PANEL  HEPATIC FUNCTION PANEL  CALCIUM, IONIZED  PROCALCITONIN    EKG None  Radiology Dg Chest 2 View  Result Date: 07/31/2018 CLINICAL DATA:  Fever.  Liver metastasis. EXAM: CHEST - 2 VIEW COMPARISON:  None. FINDINGS: The heart size and mediastinal contours are within normal limits. Both lungs  are clear. The lung volumes are low. The visualized skeletal structures are unremarkable. IMPRESSION: No active cardiopulmonary disease. Electronically Signed   By: Abelardo Diesel M.D.   On: 07/31/2018 18:43   Ct Abdomen Pelvis W Contrast  Result Date: 07/31/2018 CLINICAL DATA:  Abdominal distension. Hypotension and tachycardia. Elevated bilirubin. History of metastatic colon cancer. EXAM: CT ABDOMEN AND PELVIS WITH CONTRAST TECHNIQUE: Multidetector CT imaging of the abdomen and pelvis was performed using the standard protocol following bolus administration of intravenous contrast. CONTRAST:  170mL ISOVUE-300 IOPAMIDOL (ISOVUE-300) INJECTION 61% COMPARISON:  Abdominal MRI 07/23/2018. CT abdomen and pelvis 07/18/2018. FINDINGS: Lower chest: Consolidative opacities in the left greater than right lower lobes and lingula. No pleural effusion. Hepatobiliary: Numerous hypoattenuating lesions are again seen throughout the liver with some appearing mildly larger than on the prior studies (for  example a 4.3 cm segment IVa lesion on series 3, image 33 which measured 3.7 cm on the prior CT). Layering hyperattenuating material in the gallbladder, possibly sludge. Possible mild gallbladder wall thickening. No biliary dilatation. Pancreas: Nearly completely resolved peripancreatic inflammation. No pancreatic ductal dilatation. Spleen: Unremarkable. Adrenals/Urinary Tract: Unremarkable adrenal glands. No evidence of renal mass, calculi, or hydronephrosis. Unremarkable bladder. Stomach/Bowel: Masslike wall thickening in the ascending colon as previously seen. No bowel obstruction. Unremarkable appendix. Vascular/Lymphatic: Normal caliber of the abdominal aorta. 3.9 x 2.8 cm right lower quadrant mesenteric lymph node, mildly enlarged from the prior CT (previously 3.2 x 2.5 cm). Reproductive: Unremarkable prostate. Other: Small volume ascites. No pneumoperitoneum. Small fat containing umbilical hernia. Musculoskeletal: No suspicious osseous lesion. Severe L5-S1 disc space narrowing with vacuum disc and degenerative endplate sclerosis as well as asymmetric left neural foraminal stenosis. IMPRESSION: 1. Mild worsening of diffuse liver metastases and right lower quadrant mesenteric lymphadenopathy. 2. Ascending colon mass as previously described. 3. Nearly completely resolved pancreatic inflammation. 4. Left greater than right basilar lung opacities which may reflect pneumonia or atelectasis. 5. Persistent small volume ascites. Electronically Signed   By: Logan Bores M.D.   On: 07/31/2018 19:54   US Abdomen Limited Ruq  Result Date: 07/31/2018 CLINICAL DATA:  Worsened pain, jaundice and elevated white count. Metastatic colon cancer. EXAM: ULTRASOUND ABDOMEN LIMITED RIGHT UPPER QUADRANT COMPARISON:  Multiple CT and MR studies of the last 2 weeks. FINDINGS: Gallbladder: Gallbladder contains sludge but no shadowing stones. No Murphy sign. Maximal wall thickness 3 mm. No surrounding fluid. Common bile duct:  Diameter: 6 mm Liver: Innumerable metastatic lesions throughout the liver. Previously evaluated by CT. Portal vein is patent on color Doppler imaging with normal direction of blood flow towards the liver. IMPRESSION: Widespread and advanced hepatic metastatic disease as known by previous imaging studies. The gallbladder contains sludge but no shadowing stones. There is no Murphy sign. Borderline gallbladder wall thickness but no surrounding fluid. The overall balance of the information argues against cholecystitis. Electronically Signed   By: Nelson Chimes M.D.   On: 07/31/2018 20:43    Procedures .Critical Care Performed by: Lennice Sites, DO Authorized by: Lennice Sites, DO   Critical care provider statement:    Critical care time (minutes):  40   Critical care was necessary to treat or prevent imminent or life-threatening deterioration of the following conditions:  Sepsis   Critical care was time spent personally by me on the following activities:  Development of treatment plan with patient or surrogate, discussions with consultants, discussions with primary provider, evaluation of patient's response to treatment, ordering and performing treatments and interventions, ordering and review  of laboratory studies, ordering and review of radiographic studies, pulse oximetry, re-evaluation of patient's condition and review of old charts   I assumed direction of critical care for this patient from another provider in my specialty: no     (including critical care time)  Medications Ordered in ED Medications  iopamidol (ISOVUE-300) 61 % injection (has no administration in time range)  sodium chloride 0.9 % injection (has no administration in time range)  loratadine (CLARITIN) tablet 10 mg (has no administration in time range)  feeding supplement (ENSURE ENLIVE) (ENSURE ENLIVE) liquid 237 mL (has no administration in time range)  multivitamin with minerals tablet 1 tablet (has no administration in  time range)  protein supplement (PREMIER PROTEIN) liquid - approved for s/p bariatric surgery (has no administration in time range)  pantoprazole (PROTONIX) EC tablet 40 mg (has no administration in time range)  heparin injection 5,000 Units (has no administration in time range)  pamidronate (AREDIA) 90 mg in sodium chloride 0.9 % 500 mL IVPB (has no administration in time range)  lactated ringers infusion (has no administration in time range)  fentaNYL (SUBLIMAZE) injection 25 mcg (has no administration in time range)  acetaminophen (TYLENOL) tablet 650 mg (has no administration in time range)  piperacillin-tazobactam (ZOSYN) IVPB 3.375 g (has no administration in time range)  lactated ringers bolus 1,000 mL (0 mLs Intravenous Stopped 07/31/18 2027)  morphine 4 MG/ML injection 4 mg (4 mg Intravenous Given 07/31/18 1759)  piperacillin-tazobactam (ZOSYN) IVPB 3.375 g (0 g Intravenous Stopped 07/31/18 2027)  acetaminophen (TYLENOL) tablet 650 mg (650 mg Oral Given 07/31/18 1848)  iopamidol (ISOVUE-300) 61 % injection 100 mL (100 mLs Intravenous Contrast Given 07/31/18 1857)     Initial Impression / Assessment and Plan / ED Course  I have reviewed the triage vital signs and the nursing notes.  Pertinent labs & imaging results that were available during my care of the patient were reviewed by me and considered in my medical decision making (see chart for details).     Jeffery Roman is a 52 year old male with history of colon cancer with liver metastasis who presents to the ED with fever, tachycardia.  Patient with normal blood pressure.  Patient sent from cancer center as concern for infectious process.  Patient recently diagnosed with cancer.  Was getting a Port-A-Cath placed today but was canceled due to fever.  Patient with blood work ready collected at cancer center that was significant for leukocytosis, elevated bilirubin.  Concern for possible cholangitis, other infectious source.  Patient is  tender mostly in his right upper quadrant and epigastric region on abdominal exam.  Patient did have recent MRCP and ERCP.  Patient had infectious work-up initiated and was started on IV Zosyn.  Patient was also given IV cefepime by cancer center which was not seen prior to IV Zosyn being given.  Lactic acid was elevated.  Code sepsis was initiated.  Blood cultures already collected.  Urinalysis negative for infection.  Chest x-ray negative for infection.  Repeat right upper quadrant ultrasound and CT of the abdomen and pelvis showed no acute findings.  Ongoing liver metastasis.  No obvious cholecystitis, abscess.  Given elevated bilirubin, right upper quadrant pain, fever concern for possible cholangitis.  Possibly viral process.  Patient remained hemodynamically stable throughout my care.  Admitted to medicine for further infectious rule out.  Will likely need GI consultation tomorrow.  Patient was given IV fluids and lactic acid mildly improved.  Suspect lactic acid also elevated from  liver disease as well.  This chart was dictated using voice recognition software.  Despite best efforts to proofread,  errors can occur which can change the documentation meaning.   Final Clinical Impressions(s) / ED Diagnoses   Final diagnoses:  Pain  Sepsis, due to unspecified organism, unspecified whether acute organ dysfunction present [facility])  Elevated bilirubin    ED Discharge Orders    None       Lennice Sites, DO 07/31/18 2301

## 2018-07-31 NOTE — Patient Instructions (Signed)
Implanted Port Home Guide An implanted port is a type of central line that is placed under the skin. Central lines are used to provide IV access when treatment or nutrition needs to be given through a person's veins. Implanted ports are used for long-term IV access. An implanted port may be placed because:  You need IV medicine that would be irritating to the small veins in your hands or arms.  You need long-term IV medicines, such as antibiotics.  You need IV nutrition for a long period.  You need frequent blood draws for lab tests.  You need dialysis.  Implanted ports are usually placed in the chest area, but they can also be placed in the upper arm, the abdomen, or the leg. An implanted port has two main parts:  Reservoir. The reservoir is round and will appear as a small, raised area under your skin. The reservoir is the part where a needle is inserted to give medicines or draw blood.  Catheter. The catheter is a thin, flexible tube that extends from the reservoir. The catheter is placed into a large vein. Medicine that is inserted into the reservoir goes into the catheter and then into the vein.  How will I care for my incision site? Do not get the incision site wet. Bathe or shower as directed by your health care provider. How is my port accessed? Special steps must be taken to access the port:  Before the port is accessed, a numbing cream can be placed on the skin. This helps numb the skin over the port site.  Your health care provider uses a sterile technique to access the port. ? Your health care provider must put on a mask and sterile gloves. ? The skin over your port is cleaned carefully with an antiseptic and allowed to dry. ? The port is gently pinched between sterile gloves, and a needle is inserted into the port.  Only "non-coring" port needles should be used to access the port. Once the port is accessed, a blood return should be checked. This helps ensure that the port  is in the vein and is not clogged.  If your port needs to remain accessed for a constant infusion, a clear (transparent) bandage will be placed over the needle site. The bandage and needle will need to be changed every week, or as directed by your health care provider.  Keep the bandage covering the needle clean and dry. Do not get it wet. Follow your health care provider's instructions on how to take a shower or bath while the port is accessed.  If your port does not need to stay accessed, no bandage is needed over the port.  What is flushing? Flushing helps keep the port from getting clogged. Follow your health care provider's instructions on how and when to flush the port. Ports are usually flushed with saline solution or a medicine called heparin. The need for flushing will depend on how the port is used.  If the port is used for intermittent medicines or blood draws, the port will need to be flushed: ? After medicines have been given. ? After blood has been drawn. ? As part of routine maintenance.  If a constant infusion is running, the port may not need to be flushed.  How long will my port stay implanted? The port can stay in for as long as your health care provider thinks it is needed. When it is time for the port to come out, surgery will be   done to remove it. The procedure is similar to the one performed when the port was put in. When should I seek immediate medical care? When you have an implanted port, you should seek immediate medical care if:  You notice a bad smell coming from the incision site.  You have swelling, redness, or drainage at the incision site.  You have more swelling or pain at the port site or the surrounding area.  You have a fever that is not controlled with medicine.  This information is not intended to replace advice given to you by your health care provider. Make sure you discuss any questions you have with your health care provider. Document  Released: 09/12/2005 Document Revised: 02/18/2016 Document Reviewed: 05/20/2013 Elsevier Interactive Patient Education  2017 [employer]  

## 2018-08-01 ENCOUNTER — Other Ambulatory Visit: Payer: Self-pay

## 2018-08-01 ENCOUNTER — Inpatient Hospital Stay: Payer: BLUE CROSS/BLUE SHIELD

## 2018-08-01 ENCOUNTER — Inpatient Hospital Stay: Payer: BLUE CROSS/BLUE SHIELD | Admitting: Nurse Practitioner

## 2018-08-01 ENCOUNTER — Telehealth: Payer: Self-pay | Admitting: Medical

## 2018-08-01 ENCOUNTER — Encounter (HOSPITAL_COMMUNITY): Payer: Self-pay

## 2018-08-01 DIAGNOSIS — K7200 Acute and subacute hepatic failure without coma: Secondary | ICD-10-CM

## 2018-08-01 DIAGNOSIS — N179 Acute kidney failure, unspecified: Secondary | ICD-10-CM

## 2018-08-01 DIAGNOSIS — C787 Secondary malignant neoplasm of liver and intrahepatic bile duct: Secondary | ICD-10-CM

## 2018-08-01 DIAGNOSIS — K219 Gastro-esophageal reflux disease without esophagitis: Secondary | ICD-10-CM

## 2018-08-01 DIAGNOSIS — D473 Essential (hemorrhagic) thrombocythemia: Secondary | ICD-10-CM

## 2018-08-01 DIAGNOSIS — A419 Sepsis, unspecified organism: Secondary | ICD-10-CM

## 2018-08-01 DIAGNOSIS — K8309 Other cholangitis: Secondary | ICD-10-CM

## 2018-08-01 DIAGNOSIS — J90 Pleural effusion, not elsewhere classified: Secondary | ICD-10-CM

## 2018-08-01 DIAGNOSIS — R6520 Severe sepsis without septic shock: Secondary | ICD-10-CM

## 2018-08-01 DIAGNOSIS — E43 Unspecified severe protein-calorie malnutrition: Secondary | ICD-10-CM

## 2018-08-01 DIAGNOSIS — D630 Anemia in neoplastic disease: Secondary | ICD-10-CM

## 2018-08-01 DIAGNOSIS — D75839 Thrombocytosis, unspecified: Secondary | ICD-10-CM

## 2018-08-01 DIAGNOSIS — D649 Anemia, unspecified: Secondary | ICD-10-CM

## 2018-08-01 LAB — BASIC METABOLIC PANEL
Anion gap: 9 (ref 5–15)
BUN: 30 mg/dL — AB (ref 6–20)
CALCIUM: 12.3 mg/dL — AB (ref 8.9–10.3)
CO2: 25 mmol/L (ref 22–32)
CREATININE: 1.28 mg/dL — AB (ref 0.61–1.24)
Chloride: 102 mmol/L (ref 98–111)
GFR calc Af Amer: >60 mL/min (ref >60)
GFR calc non Af Amer: >60 mL/min (ref >60)
Glucose, Bld: 75 mg/dL (ref 70–99)
POTASSIUM: 5 mmol/L (ref 3.5–5.1)
Sodium: 136 mmol/L (ref 135–145)

## 2018-08-01 LAB — HEPATIC FUNCTION PANEL
ALT: 53 U/L — ABNORMAL HIGH (ref 0–44)
AST: 125 U/L — ABNORMAL HIGH (ref 15–41)
Albumin: 2.4 g/dL — ABNORMAL LOW (ref 3.5–5.0)
Alkaline Phosphatase: 205 U/L — ABNORMAL HIGH (ref 38–126)
BILIRUBIN INDIRECT: 4.4 mg/dL — AB (ref 0.3–0.9)
Bilirubin, Direct: 7.4 mg/dL — ABNORMAL HIGH (ref 0.0–0.2)
TOTAL PROTEIN: 7.2 g/dL (ref 6.5–8.1)
Total Bilirubin: 11.8 mg/dL — ABNORMAL HIGH (ref 0.3–1.2)

## 2018-08-01 LAB — CBC
HCT: 26.8 % — ABNORMAL LOW (ref 39.0–52.0)
Hemoglobin: 8.5 g/dL — ABNORMAL LOW (ref 13.0–17.0)
MCH: 26.6 pg (ref 26.0–34.0)
MCHC: 31.7 g/dL (ref 30.0–36.0)
MCV: 84 fL (ref 80.0–100.0)
PLATELETS: 441 10*3/uL — AB (ref 150–400)
RBC: 3.19 MIL/uL — AB (ref 4.22–5.81)
RDW: 15 % (ref 11.5–15.5)
WBC: 22.2 10*3/uL — ABNORMAL HIGH (ref 4.0–10.5)
nRBC: 0.2 % (ref 0.0–0.2)

## 2018-08-01 LAB — PROCALCITONIN: PROCALCITONIN: 8.63 ng/mL

## 2018-08-01 LAB — MRSA PCR SCREENING: MRSA BY PCR: NEGATIVE

## 2018-08-01 MED ORDER — LIDOCAINE-PRILOCAINE 2.5-2.5 % EX CREA: TOPICAL_CREAM | CUTANEOUS | Status: DC | PRN

## 2018-08-01 MED ORDER — SENNOSIDES-DOCUSATE SODIUM 8.6-50 MG PO TABS
1.0000 | ORAL_TABLET | Freq: Every day | ORAL | Status: DC
  Administered 2018-08-01: 1 via ORAL
  Filled 2018-08-01: qty 1

## 2018-08-01 MED ORDER — LACTATED RINGERS IV BOLUS
1000.0000 mL | Freq: Once | INTRAVENOUS | Status: AC
  Administered 2018-08-01: 1000 mL via INTRAVENOUS

## 2018-08-01 MED ORDER — CHLORHEXIDINE GLUCONATE 0.12 % MT SOLN
15.0000 mL | Freq: Two times a day (BID) | OROMUCOSAL | Status: DC
  Administered 2018-08-01 – 2018-08-06 (×10): 15 mL via OROMUCOSAL
  Filled 2018-08-01 (×12): qty 15

## 2018-08-01 MED ORDER — HYDROMORPHONE HCL 1 MG/ML IJ SOLN
1.0000 mg | INTRAMUSCULAR | Status: DC | PRN
  Administered 2018-08-01 – 2018-08-05 (×3): 1 mg via INTRAVENOUS
  Filled 2018-08-01 (×3): qty 1

## 2018-08-01 MED ORDER — SODIUM CHLORIDE 0.9 % IV SOLN
INTRAVENOUS | Status: DC | PRN
  Administered 2018-08-01: 250 mL via INTRAVENOUS

## 2018-08-01 MED ORDER — OXYCODONE HCL 5 MG PO TABS
5.0000 mg | ORAL_TABLET | ORAL | Status: DC | PRN
  Administered 2018-08-01 – 2018-08-06 (×7): 5 mg via ORAL
  Filled 2018-08-01 (×8): qty 1

## 2018-08-01 MED ORDER — POLYETHYLENE GLYCOL 3350 17 G PO PACK
17.0000 g | PACK | Freq: Every day | ORAL | Status: DC
  Filled 2018-08-01 (×2): qty 1

## 2018-08-01 MED ORDER — SODIUM CHLORIDE 0.9 % IV SOLN
INTRAVENOUS | Status: DC
  Administered 2018-08-01 – 2018-08-02 (×2): via INTRAVENOUS

## 2018-08-01 MED ORDER — SODIUM CHLORIDE 0.9 % IV BOLUS
1000.0000 mL | Freq: Once | INTRAVENOUS | Status: AC
  Administered 2018-08-01: 1000 mL via INTRAVENOUS

## 2018-08-01 MED ORDER — ENOXAPARIN SODIUM 40 MG/0.4ML ~~LOC~~ SOLN
40.0000 mg | SUBCUTANEOUS | Status: DC
  Administered 2018-08-01 – 2018-08-06 (×6): 40 mg via SUBCUTANEOUS
  Filled 2018-08-01 (×6): qty 0.4

## 2018-08-01 MED ORDER — SODIUM CHLORIDE 0.9 % IV BOLUS
500.0000 mL | Freq: Once | INTRAVENOUS | Status: AC
  Administered 2018-08-01: 500 mL via INTRAVENOUS

## 2018-08-01 MED ORDER — IBUPROFEN 200 MG PO TABS
400.0000 mg | ORAL_TABLET | Freq: Once | ORAL | Status: AC
  Administered 2018-08-01: 400 mg via ORAL
  Filled 2018-08-01: qty 2

## 2018-08-01 MED ORDER — ONDANSETRON HCL 4 MG/2ML IJ SOLN
4.0000 mg | Freq: Four times a day (QID) | INTRAMUSCULAR | Status: DC | PRN
  Administered 2018-08-01 – 2018-08-05 (×8): 4 mg via INTRAVENOUS
  Filled 2018-08-01 (×8): qty 2

## 2018-08-01 MED ORDER — ORAL CARE MOUTH RINSE
15.0000 mL | Freq: Two times a day (BID) | OROMUCOSAL | Status: DC
  Administered 2018-08-01 – 2018-08-02 (×2): 15 mL via OROMUCOSAL

## 2018-08-01 NOTE — Care Management Note (Signed)
Case Management Note  Patient Details  Name: Jeffery Roman MRN: [id number] Date of Birth: 1966/09/08  Subjective/Objective:                  Temperature 101 F, tachycardic, blood pressure stable, and satting well on room air.  White count 22,000.  Lactic acid 2.9.  Hemoglobin 8.8, baseline 10-11.  Corrected calcium 14.1.  AST 118, ALT 50, alk phos 248, and T bili 9.2.  LFTs have previously been elevated but now worse, especially T bili.  Chest x-ray showing no active cardiopulmonary disease.  Right upper quadrant ultrasound showing widespread and advanced hepatic metastatic disease as known by previous imaging studies and no evidence of acute cholecystitis.  CT abdomen pelvis showing mild worsening of diffuse liver metastasis and right lower quadrant mesenteric lymphadenopathy, ascending colon mass as previously described, nearly completely resolved pancreatic inflammation, left greater than right basilar lung opacities which may reflect pneumonia or atelectasis, and persistent small volume ascites.  Patient received Tylenol, morphine, Zosyn, and 1 L lactated Ringer's bolus in the ED.  Action/Plan: Following for progression of care and condition. Following for cm needs none present at this time.  Expected Discharge Date:  (unknown)               Expected Discharge Plan:  Home/Self Care  In-House Referral:     Discharge planning Services  CM Consult  Post Acute Care Choice:    Choice offered to:     DME Arranged:    DME Agency:     HH Arranged:    HH Agency:     Status of Service:  In process, will continue to follow  If discussed at Long Length of Stay Meetings, dates discussed:    Additional Comments:  Leeroy Cha, RN 08/01/2018, 9:24 AM

## 2018-08-01 NOTE — Consult Note (Signed)
Referring Provider:  Dr. Ninetta Lights Primary Care Physician:  Alroy Dust, Carlean Jews.Marlou Sa, MD Primary Gastroenterologist:  None (had recent colonoscopy by Dr. Cannon Kettle)  Reason for Consultation: Worsened liver chemistries  HPI: Jeffery Roman is a 52 y.o. male who was found to have metastatic colon cancer on her recent hospitalization, having undergone colonoscopy by Dr. Cannon Kettle on October 27 which showed a fungating colonic mass, biopsy confirmed adenocarcinoma, with subsequent liver biopsy confirming metastatic disease in the liver.    From the time of the patient's hospital discharge roughly 5 days ago to now, there has been dramatic rise in his bilirubin, from 3.8 to a level of 9.2 on admission yesterday and 11.8 today.    He was also noted at the cancer center yesterday, where a Port-A-Cath was to be placed, to have had a marked increase in his white count from a baseline of 15,000 to a level of 22,000, as well as a low-grade fever of 99.6 degrees.    Interestingly, the patient's other liver chemistries have remained relatively stable, with his alkaline phosphatase going from 233 to a current level of 205, and his AST rising from 74 to a current level of 125 (ALT hovering around 50).    The patient has had diffuse mid abdominal pain throughout this time, and in fact, was in pain to some degree when he was discharged from the hospital 5 days ago.  At home, with regular medication and a lot of rest, the pain seemed to improve somewhat.  Several nights ago, he felt warm and had night sweats and then chills, but he has had no frank fevers at home nor any ongoing sweats and chills, nor any dramatic change in his pain to correlate with the significant worsening of his bilirubin level.  Meanwhile, on admission last night, the patient underwent both a CT of the abdomen and pelvis, and a right upper quadrant ultrasound, which I have reviewed over the phone with the radiologist.  They do not show any  biliary ductal dilatation (either intrahepatic or extrahepatic).  They do show extensive tumor burden in the liver, which has probably progressed somewhat over the past week.   Past Medical History:  Diagnosis Date  . Hypertension     Past Surgical History:  Procedure Laterality Date  . BIOPSY  07/22/2018   Procedure: BIOPSY;  Surgeon: Wilford Corner, MD;  Location: [facility];  Service: Endoscopy;;  . COLONOSCOPY WITH PROPOFOL N/A 07/22/2018   Procedure: COLONOSCOPY WITH PROPOFOL;  Surgeon: Wilford Corner, MD;  Location: [facility];  Service: Endoscopy;  Laterality: N/A;  . SUBMUCOSAL INJECTION  07/22/2018   Procedure: SUBMUCOSAL INJECTION;  Surgeon: Wilford Corner, MD;  Location: [facility];  Service: Endoscopy;;  tattoo    Prior to Admission medications   Medication Sig Start Date End Date Taking? Authorizing Provider  amLODipine (NORVASC) 10 MG tablet Take 1 tablet (10 mg total) by mouth daily. 07/27/18  Yes Eugenie Filler, MD  feeding supplement, ENSURE ENLIVE, (ENSURE ENLIVE) LIQD Take 237 mLs by mouth daily. 07/28/18  Yes Eugenie Filler, MD  loratadine (CLARITIN) 10 MG tablet Take 10 mg by mouth daily.   Yes [provider]  Multiple Vitamin (MULTIVITAMIN WITH MINERALS) TABS tablet Take 1 tablet by mouth daily.   Yes [provider]  omeprazole (PRILOSEC) 20 MG capsule Take 20 mg by mouth daily.   Yes [provider]  ondansetron (ZOFRAN) 8 MG tablet Take 1 tablet (8 mg total) by mouth every  8 (eight) hours as needed for nausea or vomiting. 07/27/18  Yes Eugenie Filler, MD  oxyCODONE (OXY IR/ROXICODONE) 5 MG immediate release tablet Take 1 tablet (5 mg total) by mouth every 4 (four) hours as needed for moderate pain. 07/27/18  Yes Eugenie Filler, MD  polyethylene glycol [facility] / Floria Raveling) packet Take 17 g by mouth daily as needed for moderate constipation. 07/27/18  Yes Eugenie Filler, MD  protein supplement shake (PREMIER  PROTEIN) LIQD Take 325 mLs (11 oz total) by mouth 2 (two) times daily between meals. 07/27/18  Yes Eugenie Filler, MD  prochlorperazine (COMPAZINE) 10 MG tablet Take 1 tablet (10 mg total) by mouth every 6 (six) hours as needed (NAUSEA). 07/27/18   Eugenie Filler, MD    Current Facility-Administered Medications  Medication Dose Route Frequency Provider Last Rate Last Dose  . 0.9 %  sodium chloride infusion   Intravenous Continuous Edwin Dada, MD 100 mL/hr at 08/01/18 0933    . 0.9 %  sodium chloride infusion   Intravenous PRN Edwin Dada, MD 10 mL/hr at 08/01/18 0936 250 mL at 08/01/18 0936  . chlorhexidine (PERIDEX) 0.12 % solution 15 mL  15 mL Mouth Rinse BID Shela Leff, MD      . enoxaparin (LOVENOX) injection 40 mg  40 mg Subcutaneous Q24H Danford, Suann Larry, MD      . feeding supplement (ENSURE ENLIVE) (ENSURE ENLIVE) liquid 237 mL  237 mL Oral Daily Shela Leff, MD      . HYDROmorphone (DILAUDID) injection 1 mg  1 mg Intravenous Q4H PRN Edwin Dada, MD   1 mg at 08/01/18 0845  . MEDLINE mouth rinse  15 mL Mouth Rinse q12n4p Shela Leff, MD      . multivitamin with minerals tablet 1 tablet  1 tablet Oral Daily Shela Leff, MD      . ondansetron (ZOFRAN) injection 4 mg  4 mg Intravenous Q6H PRN Shela Leff, MD   4 mg at 08/01/18 0730  . oxyCODONE (Oxy IR/ROXICODONE) immediate release tablet 5 mg  5 mg Oral Q4H PRN Danford, Suann Larry, MD      . piperacillin-tazobactam (ZOSYN) IVPB 3.375 g  3.375 g Intravenous Q8H Shela Leff, MD   Stopped at 08/01/18 [phone]  . polyethylene glycol (MIRALAX / GLYCOLAX) packet 17 g  17 g Oral Daily Danford, Suann Larry, MD      . protein supplement (PREMIER PROTEIN) liquid - approved for s/p bariatric surgery  11 oz Oral BID BM Shela Leff, MD      . senna-docusate (Senokot-S) tablet 1 tablet  1 tablet Oral QHS Danford, Christopher P, MD      . sodium chloride 0.9 %  bolus 1,000 mL  1,000 mL Intravenous Once Edwin Dada, MD 983.6 mL/hr at 08/01/18 0926 1,000 mL at 08/01/18 0926    Allergies as of 07/31/2018 - Review Complete 07/31/2018  Allergen Reaction Noted  . Ciprofloxacin Other (See Comments) 07/18/2018  . Lisinopril Other (See Comments) 07/18/2018    Family History  Problem Relation Age of Onset  . Diabetes Mellitus II Mother   . Prostate cancer Father   . Colon cancer Maternal Grandmother     Social History   Socioeconomic History  . Marital status: Married    Spouse name: Not on file  . Number of children: Not on file  . Years of education: Not on file  . Highest education level: Not on file  Occupational History  .  Not on file  Social Needs  . Financial resource strain: Not on file  . Food insecurity:    Worry: Not on file    Inability: Not on file  . Transportation needs:    Medical: Not on file    Non-medical: Not on file  Tobacco Use  . Smoking status: Never Smoker  . Smokeless tobacco: Never Used  Substance and Sexual Activity  . Alcohol use: Not on file    Comment: twice per year  . Drug use: Never  . Sexual activity: Not on file  Lifestyle  . Physical activity:    Days per week: Not on file    Minutes per session: Not on file  . Stress: Not on file  Relationships  . Social connections:    Talks on phone: Not on file    Gets together: Not on file    Attends religious service: Not on file    Active member of club or organization: Not on file    Attends meetings of clubs or organizations: Not on file    Relationship status: Not on file  . Intimate partner violence:    Fear of current or ex partner: Not on file    Emotionally abused: Not on file    Physically abused: Not on file    Forced sexual activity: Not on file  Other Topics Concern  . Not on file  Social History Narrative  . Not on file    Review of Systems: See history of present illness  Physical Exam: Vital signs in last 24  hours: Temp:  [99.2 F (37.3 C)-101 F (38.3 C)] 99.7 F (37.6 C) (11/06 0800) Pulse Rate:  [101-132] 120 (11/06 0600) Resp:  [18-35] 35 (11/06 0600) BP: (123-158)/(62-81) 144/65 (11/06 0600) SpO2:  [92 %-99 %] 92 % (11/06 0600) Weight:  [81.1 kg-85.2 kg] 85.2 kg (11/05 2325)   A well-nourished African-American male, pleasant, rather quiet, slightly uncomfortable but not in frank distress lying in bed.  Interestingly, no overt scleral icterus or deep jaundice despite his high bilirubin level.  Chest is clear, heart rate is rapid but without murmur or arrhythmia.  Abdomen is mildly protuberant, bowel sounds quiet, diffuse fullness in upper abdomen without discrete liver edge, no exquisite tenderness but mild to moderate subjective discomfort to medium palpation.  Intake/Output from previous day: 11/05 0701 - 11/06 0700 In: 700.1 [I.V.:13.5; IV Piggyback:686.6] Out: 200 [Urine:200] Intake/Output this shift: No intake/output data recorded.  Lab Results: Recent Labs    07/31/18 1007 08/01/18 0323  WBC 22.0* 22.2*  HGB 8.8* 8.5*  HCT 27.3* 26.8*  PLT 432* 441*   BMET Recent Labs    07/31/18 1431 07/31/18 1817 08/01/18 0323  NA 136 138 136  K 5.3* 5.0 5.0  CL 103 105 102  CO2 25 24 25   GLUCOSE 89 93 75  BUN 31* 32* 30*  CREATININE 1.37* 1.21 1.28*  CALCIUM 12.4* 12.1* 12.3*   LFT Recent Labs    08/01/18 0323  PROT 7.2  ALBUMIN 2.4*  AST 125*  ALT 53*  ALKPHOS 205*  BILITOT 11.8*  BILIDIR 7.4*  IBILI 4.4*   PT/INR No results for input(s): LABPROT, INR in the last 72 hours.  Studies/Results: Dg Chest 2 View  Result Date: 07/31/2018 CLINICAL DATA:  Fever.  Liver metastasis. EXAM: CHEST - 2 VIEW COMPARISON:  None. FINDINGS: The heart size and mediastinal contours are within normal limits. Both lungs are clear. The lung volumes are low. The visualized skeletal  structures are unremarkable. IMPRESSION: No active cardiopulmonary disease. Electronically Signed   By:  Abelardo Diesel M.D.   On: 07/31/2018 18:43   Ct Abdomen Pelvis W Contrast  Result Date: 07/31/2018 CLINICAL DATA:  Abdominal distension. Hypotension and tachycardia. Elevated bilirubin. History of metastatic colon cancer. EXAM: CT ABDOMEN AND PELVIS WITH CONTRAST TECHNIQUE: Multidetector CT imaging of the abdomen and pelvis was performed using the standard protocol following bolus administration of intravenous contrast. CONTRAST:  175mL ISOVUE-300 IOPAMIDOL (ISOVUE-300) INJECTION 61% COMPARISON:  Abdominal MRI 07/23/2018. CT abdomen and pelvis 07/18/2018. FINDINGS: Lower chest: Consolidative opacities in the left greater than right lower lobes and lingula. No pleural effusion. Hepatobiliary: Numerous hypoattenuating lesions are again seen throughout the liver with some appearing mildly larger than on the prior studies (for example a 4.3 cm segment IVa lesion on series 3, image 33 which measured 3.7 cm on the prior CT). Layering hyperattenuating material in the gallbladder, possibly sludge. Possible mild gallbladder wall thickening. No biliary dilatation. Pancreas: Nearly completely resolved peripancreatic inflammation. No pancreatic ductal dilatation. Spleen: Unremarkable. Adrenals/Urinary Tract: Unremarkable adrenal glands. No evidence of renal mass, calculi, or hydronephrosis. Unremarkable bladder. Stomach/Bowel: Masslike wall thickening in the ascending colon as previously seen. No bowel obstruction. Unremarkable appendix. Vascular/Lymphatic: Normal caliber of the abdominal aorta. 3.9 x 2.8 cm right lower quadrant mesenteric lymph node, mildly enlarged from the prior CT (previously 3.2 x 2.5 cm). Reproductive: Unremarkable prostate. Other: Small volume ascites. No pneumoperitoneum. Small fat containing umbilical hernia. Musculoskeletal: No suspicious osseous lesion. Severe L5-S1 disc space narrowing with vacuum disc and degenerative endplate sclerosis as well as asymmetric left neural foraminal stenosis.  IMPRESSION: 1. Mild worsening of diffuse liver metastases and right lower quadrant mesenteric lymphadenopathy. 2. Ascending colon mass as previously described. 3. Nearly completely resolved pancreatic inflammation. 4. Left greater than right basilar lung opacities which may reflect pneumonia or atelectasis. 5. Persistent small volume ascites. Electronically Signed   By: Logan Bores M.D.   On: 07/31/2018 19:54   US Abdomen Limited Ruq  Result Date: 07/31/2018 CLINICAL DATA:  Worsened pain, jaundice and elevated white count. Metastatic colon cancer. EXAM: ULTRASOUND ABDOMEN LIMITED RIGHT UPPER QUADRANT COMPARISON:  Multiple CT and MR studies of the last 2 weeks. FINDINGS: Gallbladder: Gallbladder contains sludge but no shadowing stones. No Murphy sign. Maximal wall thickness 3 mm. No surrounding fluid. Common bile duct: Diameter: 6 mm Liver: Innumerable metastatic lesions throughout the liver. Previously evaluated by CT. Portal vein is patent on color Doppler imaging with normal direction of blood flow towards the liver. IMPRESSION: Widespread and advanced hepatic metastatic disease as known by previous imaging studies. The gallbladder contains sludge but no shadowing stones. There is no Murphy sign. Borderline gallbladder wall thickness but no surrounding fluid. The overall balance of the information argues against cholecystitis. Electronically Signed   By: Nelson Chimes M.D.   On: 07/31/2018 20:43    Impression: Isolated rapid rise in bilirubin with a patient with radiographically extensive, biopsy-confirmed metastatic disease in the liver, but without radiographic evidence of biliary obstruction.  Given the low-grade fevers and rise in white count, it is conceivable that this patient has smoldering sepsis and that the rising bilirubin reflects "reactive hepatopathy."    However, there does not appear to be a focus of infection and I think that the rising bilirubin is more likely explained by progressive  hepatic dysfunction related to infiltrative metastatic disease in the liver.  Cholangitis does not seem to be at all likely, given the fairly  stable transaminases, the absence of frank fever and chills, and the absence of a dramatic change in his baseline abdominal pain.  Moreover, there was no CT evidence of cholangitis.  Plan: I would favor supportive care and oncologic management to the degree possible.    Antibiotic therapy at this time, as is being administered, seems reasonable but I think the patient's low-grade fevers and high white count may not be the consequence of infection, but rather, tumor burden.  In particular, I do not feel there is any role for biliary drainage, given the absence of evidence of biliary obstruction in the absence of any clear evidence of cholangitis.  I do not think the patient needs an updated MRI scan, nor an ERCP with stent placement.  We will be available if you have any further questions or would like to discuss this patient's case, but at this time, I do not for see a further role for [place], so I will sign off.  Please call us if we can somehow be of further assistance.   LOS: 1 day   Jeffery Roman  08/01/2018, 10:27 AM   Pager [phone] If no answer or after 5 PM call [phone]

## 2018-08-01 NOTE — Progress Notes (Signed)
PROGRESS NOTE    ANGLE KAREL  CHE:[id number] DOB: [birth year] DOA: 07/31/2018 PCP: Alroy Dust, L.Marlou Sa, MD      Brief Narrative:  Mr. Faria is a 52 y.o. M with HTN and newly diagnosed metastatic colon cancer who presented to [facility] for port placement, found to have fever, tachycardia, hyperCa and elevated bilirubin.   Assessment & Plan:  Sepsis, possible GI source Presented with fever 102F, WBC 22K, lactic acid >2.  UA unremarkable, CXR clear but CT chest showed possible pneumonia, however, exam only notable for RUQ abdominal pain.  RUQ US showed normal CBD duct. -Continue Zosyn -Follow blood and urine culture -Consult GI re: suspected cholangitis, appreciate expert guidance   Hypercalcemia Pamidronate given.  Ca no improvement -Continue IV fluids, bolus now -Trend Ca  Metastatic colon cancer Follows with Dr. Burr Medico.  Diagnosed with metastatic cancer in Oct 2019 due to obstructive pancreatitis, found incidentally.  Colon biopsy showed adenoCA, liver lesion biopsy same. -IV Dilaudid 1 mg q4hrs PRN -Continue home oxycodone 5 mg q4hrs PRN -Consult Palliative Care for new severe diagnosis, also mainly for assistance with pain control regimen  AKI Baseline Cr 1.0, currently 1.3, no improvement overnight. -COntinue fluids -Hold amlodipine for now  Hypertension BP normal -Hold amlodipine until hemodynamics clearer  Anemia of neoplastic disease Hgb stable relative to recent basleine.  No clinical bleeding.  Severe protein calorie malnutrition  -Continue supplements   MDM and disposition: The below labs and imaging reports were reviewed and summarized above.  Medication management as above.  The patient was admitted with sepsis in setting of metastatic cancer.  He is still hemodynamically unstable with HR >120 this morning, severe pain requiring frequent dosing of IV dilaudid.    Will continue broad spectrum antibiotics, consult GI.       DVT prophylaxis:  Lovenox Code Status: FULL Family Communication: None present    Consultants:   Oncology  GI  Palliative  Procedures:   RUQ [place] 11/5 IMPRESSION: Widespread and advanced hepatic metastatic disease as known by previous imaging studies. The gallbladder contains sludge but no shadowing stones. There is no Murphy sign. Borderline gallbladder wall thickness but no surrounding fluid. The overall balance of the information argues against cholecystitis.   Electronically Signed   By: Nelson Chimes M.D.   On: 07/31/2018 20:43  Antimicrobials:   Zosyn 11/5 >>    Subjective: Fever overnight.  A lot of pain, mostly epigastrium, some RUQ.  Hurts worse with burping, coughing, hiccups.  No confusion, syncope.  No cough, dyspna, sputum, dysuria, flank pain.  Objective: Vitals:   08/01/18 0300 08/01/18 0518 08/01/18 0600 08/01/18 0800  BP: 137/64  (!) 144/65   Pulse: (!) 112  (!) 120   Resp: (!) 26  (!) 35   Temp:  99.6 F (37.6 C)  99.7 F (37.6 C)  TempSrc:  Oral  Oral  SpO2: 94%  92%   Weight:      Height:        Intake/Output Summary (Last 24 hours) at 08/01/2018 0940 Last data filed at 08/01/2018 0410 Gross per 24 hour  Intake 700.14 ml  Output 200 ml  Net 500.14 ml   Filed Weights   07/31/18 2325  Weight: 85.2 kg    Examination: General appearance:  adult male, alert and in moderate distress from pain.   HEENT: I do not appreciate icterus, conjunctiva pink, lids and lashes normal. No nasal deformity, discharge, epistaxis.  Lips moist, Dentition normal, OP moist, no oral lesions,  hearing normal.   Skin: Warm and dry.   No suspicious rashes or lesions. Cardiac: Tachycardic, regular, nl S1-S2, no murmurs appreciated.  Capillary refill is brisk.  JVP not visible.  No LE edema.  Radia  pulses 2+ and symmetric. Respiratory: Normal respiratory rate and rhythm.  CTAB without rales or wheezes. Abdomen: Abdomen with voluntary guarding, no rebound, no distention.  Severe  RUQ tenderness to palpation.   MSK: No deformities or effusions of large joints, moderate loss of subcutaneous muscle mass and fat. Neuro: Awake and alert.  EOMI, moves all extremities. Speech fluent.    Psych: Sensorium intact and responding to questions, attention normal. Affect blunted.  Judgment and insight appear normal.    Data Reviewed: I have personally reviewed following labs and imaging studies:  CBC: Recent Labs  Lab 07/26/18 0546 07/27/18 0554 07/31/18 1007 08/01/18 0323  WBC 15.9* 15.3* 22.0* 22.2*  NEUTROABS 11.5*  --   --   --   HGB 10.3* 10.1* 8.8* 8.5*  HCT 31.8* 30.9* 27.3* 26.8*  MCV 84.6 84.2 83.7 84.0  PLT 319 360 432* 778*   Basic Metabolic Panel: Recent Labs  Lab 07/26/18 0546 07/27/18 0554 07/31/18 1431 07/31/18 1817 08/01/18 0323  NA 132* 129* 136 138 136  K 4.6 4.4 5.3* 5.0 5.0  CL 101 97* 103 105 102  CO2 22 24 25 24 25   GLUCOSE 95 89 89 93 75  BUN 14 16 31* 32* 30*  CREATININE 1.03 1.08 1.37* 1.21 1.28*  CALCIUM 10.7* 11.4* 12.4* 12.1* 12.3*  MG 2.2 2.3  --   --   --    GFR: Estimated Creatinine Clearance: 74.4 mL/min (A) (by C-G formula based on SCr of 1.28 mg/dL (H)). Liver Function Tests: Recent Labs  Lab 07/26/18 0546 07/31/18 1431 08/01/18 0323  AST 74* 118* 125*  ALT 42 50* 53*  ALKPHOS 233* 248* 205*  BILITOT 3.8* 9.2* 11.8*  PROT 6.1* 6.5 7.2  ALBUMIN 2.4* 1.9* 2.4*   Recent Labs  Lab 07/31/18 1818  LIPASE 52*   Recent Labs  Lab 07/31/18 1444  AMMONIA 24   Coagulation Profile: No results for input(s): INR, PROTIME in the last 168 hours. Cardiac Enzymes: No results for input(s): CKTOTAL, CKMB, CKMBINDEX, TROPONINI in the last 168 hours. BNP (last 3 results) No results for input(s): PROBNP in the last 8760 hours. HbA1C: No results for input(s): HGBA1C in the last 72 hours. CBG: No results for input(s): GLUCAP in the last 168 hours. Lipid Profile: No results for input(s): CHOL, HDL, LDLCALC, TRIG, CHOLHDL,  LDLDIRECT in the last 72 hours. Thyroid Function Tests: No results for input(s): TSH, T4TOTAL, FREET4, T3FREE, THYROIDAB in the last 72 hours. Anemia Panel: No results for input(s): VITAMINB12, FOLATE, FERRITIN, TIBC, IRON, RETICCTPCT in the last 72 hours. Urine analysis:    Component Value Date/Time   COLORURINE AMBER (A) 07/31/2018 1750   APPEARANCEUR HAZY (A) 07/31/2018 1750   LABSPEC 1.020 07/31/2018 1750   PHURINE 5.0 07/31/2018 1750   GLUCOSEU NEGATIVE 07/31/2018 1750   HGBUR SMALL (A) 07/31/2018 1750   BILIRUBINUR SMALL (A) 07/31/2018 1750   KETONESUR NEGATIVE 07/31/2018 1750   PROTEINUR NEGATIVE 07/31/2018 1750   NITRITE NEGATIVE 07/31/2018 1750   LEUKOCYTESUR NEGATIVE 07/31/2018 1750   Sepsis Labs: @LABRCNTIP (procalcitonin:4,lacticacidven:4)  ) Recent Results (from the past 240 hour(s))  Culture, Blood     Status: None (Preliminary result)   Collection Time: 07/31/18 11:34 AM  Result Value Ref Range Status   Specimen Description  Final    BLOOD LEFT ARM Performed at [facility], [place] [address], [place], [place] [address]    Special Requests   Final    NONE Performed at [facility], [place] [address], [place], [place] [address]    Culture   Final    NO GROWTH < 24 HOURS Performed at [facility] [address], [place], [place] [address]    Report Status PENDING  Incomplete  Culture, Blood     Status: None (Preliminary result)   Collection Time: 07/31/18 11:38 AM  Result Value Ref Range Status   Specimen Description   Final    BLOOD RIGHT ARM Performed at [facility], [place] [address] [place], [place] [address]    Special Requests   Final    NONE Performed at [facility], [place] [address], [place], [place] [address]    Culture   Final    NO GROWTH < 24 HOURS Performed at [facility] [address], [place], [place] [address]    Report Status  PENDING  Incomplete  MRSA PCR Screening     Status: None   Collection Time: 07/31/18 11:28 PM  Result Value Ref Range Status   MRSA by PCR NEGATIVE NEGATIVE Final    Comment:        The GeneXpert MRSA Assay (FDA approved for NASAL specimens only), is one component of a comprehensive MRSA colonization surveillance program. It is not intended to diagnose MRSA infection nor to guide or monitor treatment for MRSA infections. Performed at [facility], [place] [address] [place], [place] [address]          Radiology Studies: Dg Chest 2 View  Result Date: 07/31/2018 CLINICAL DATA:  Fever.  Liver metastasis. EXAM: CHEST - 2 VIEW COMPARISON:  None. FINDINGS: The heart size and mediastinal contours are within normal limits. Both lungs are clear. The lung volumes are low. The visualized skeletal structures are unremarkable. IMPRESSION: No active cardiopulmonary disease. Electronically Signed   By: Abelardo Diesel M.D.   On: 07/31/2018 18:43   Ct Abdomen Pelvis W Contrast  Result Date: 07/31/2018 CLINICAL DATA:  Abdominal distension. Hypotension and tachycardia. Elevated bilirubin. History of metastatic colon cancer. EXAM: CT ABDOMEN AND PELVIS WITH CONTRAST TECHNIQUE: Multidetector CT imaging of the abdomen and pelvis was performed using the standard protocol following bolus administration of intravenous contrast. CONTRAST:  161mL ISOVUE-300 IOPAMIDOL (ISOVUE-300) INJECTION 61% COMPARISON:  Abdominal MRI 07/23/2018. CT abdomen and pelvis 07/18/2018. FINDINGS: Lower chest: Consolidative opacities in the left greater than right lower lobes and lingula. No pleural effusion. Hepatobiliary: Numerous hypoattenuating lesions are again seen throughout the liver with some appearing mildly larger than on the prior studies (for example a 4.3 cm segment IVa lesion on series 3, image 33 which measured 3.7 cm on the prior CT). Layering hyperattenuating material in the gallbladder, possibly  sludge. Possible mild gallbladder wall thickening. No biliary dilatation. Pancreas: Nearly completely resolved peripancreatic inflammation. No pancreatic ductal dilatation. Spleen: Unremarkable. Adrenals/Urinary Tract: Unremarkable adrenal glands. No evidence of renal mass, calculi, or hydronephrosis. Unremarkable bladder. Stomach/Bowel: Masslike wall thickening in the ascending colon as previously seen. No bowel obstruction. Unremarkable appendix. Vascular/Lymphatic: Normal caliber of the abdominal aorta. 3.9 x 2.8 cm right lower quadrant mesenteric lymph node, mildly enlarged from the prior CT (previously 3.2 x 2.5 cm). Reproductive: Unremarkable prostate. Other: Small volume ascites. No pneumoperitoneum. Small fat containing umbilical  hernia. Musculoskeletal: No suspicious osseous lesion. Severe L5-S1 disc space narrowing with vacuum disc and degenerative endplate sclerosis as well as asymmetric left neural foraminal stenosis. IMPRESSION: 1. Mild worsening of diffuse liver metastases and right lower quadrant mesenteric lymphadenopathy. 2. Ascending colon mass as previously described. 3. Nearly completely resolved pancreatic inflammation. 4. Left greater than right basilar lung opacities which may reflect pneumonia or atelectasis. 5. Persistent small volume ascites. Electronically Signed   By: Logan Bores M.D.   On: 07/31/2018 19:54   US Abdomen Limited Ruq  Result Date: 07/31/2018 CLINICAL DATA:  Worsened pain, jaundice and elevated white count. Metastatic colon cancer. EXAM: ULTRASOUND ABDOMEN LIMITED RIGHT UPPER QUADRANT COMPARISON:  Multiple CT and MR studies of the last 2 weeks. FINDINGS: Gallbladder: Gallbladder contains sludge but no shadowing stones. No Murphy sign. Maximal wall thickness 3 mm. No surrounding fluid. Common bile duct: Diameter: 6 mm Liver: Innumerable metastatic lesions throughout the liver. Previously evaluated by CT. Portal vein is patent on color Doppler imaging with normal  direction of blood flow towards the liver. IMPRESSION: Widespread and advanced hepatic metastatic disease as known by previous imaging studies. The gallbladder contains sludge but no shadowing stones. There is no Murphy sign. Borderline gallbladder wall thickness but no surrounding fluid. The overall balance of the information argues against cholecystitis. Electronically Signed   By: Nelson Chimes M.D.   On: 07/31/2018 20:43        Scheduled Meds: . chlorhexidine  15 mL Mouth Rinse BID  . feeding supplement (ENSURE ENLIVE)  237 mL Oral Daily  . mouth rinse  15 mL Mouth Rinse q12n4p  . multivitamin with minerals  1 tablet Oral Daily  . polyethylene glycol  17 g Oral Daily  . protein supplement shake  11 oz Oral BID BM  . senna-docusate  1 tablet Oral QHS   Continuous Infusions: . sodium chloride 100 mL/hr at 08/01/18 0933  . sodium chloride 250 mL (08/01/18 0936)  . piperacillin-tazobactam (ZOSYN)  IV Stopped (08/01/18 0927)  . sodium chloride 1,000 mL (08/01/18 0926)     LOS: 1 day    Time spent: 35 minutes    Edwin Dada, MD [name] 08/01/2018, 9:40 AM     Pager [phone] --- please page though AMION:  [email or web address] If 7PM-7AM, please contact night-coverage

## 2018-08-01 NOTE — Telephone Encounter (Signed)
No los per 11/5. °

## 2018-08-01 NOTE — Progress Notes (Signed)
Jeffery Roman   DOB:1965/11/12   ZT#:[id number]   JAS#:[id number]  Oncology follow up   Subjective: Patient was admitted from my office yesterday for severe hyperbilirubinemia, mental status change and fever.  He is afebrile today, vital signs stable with mild tachypnea and tachycardia.  He feels about the same, had epigastric and right upper quadrant abdominal pain this morning, improved with pain medication.  He is still drowsy, easily arousable, oriented. Chronic ill appearing.   Objective:  Vitals:   08/01/18 1200 08/01/18 1300  BP: 134/82 (!) 143/97  Pulse: (!) 109 (!) 122  Resp: (!) 28 (!) 32  Temp: 98.4 F (36.9 C)   SpO2: 98% 98%    Body mass index is 26.2 kg/m.  Intake/Output Summary (Last 24 hours) at 08/01/2018 1305 Last data filed at 08/01/2018 1300 Gross per 24 hour  Intake 2660.14 ml  Output 200 ml  Net 2460.14 ml     Sclerae unicteric  Oropharynx clear  No peripheral adenopathy  Lungs clear -- no rales or rhonchi  Heart regular rate and rhythm  Abdomen benign  MSK no focal spinal tenderness, no peripheral edema  Neuro nonfocal   CBG (last 3)  No results for input(s): GLUCAP in the last 72 hours.   Labs:  Lab Results  Component Value Date   WBC 22.2 (H) 08/01/2018   HGB 8.5 (L) 08/01/2018   HCT 26.8 (L) 08/01/2018   MCV 84.0 08/01/2018   PLT 441 (H) 08/01/2018   NEUTROABS 11.5 (H) 07/26/2018   CMP Latest Ref Rng & Units 08/01/2018 07/31/2018 07/31/2018  Glucose 70 - 99 mg/dL 75 93 89  BUN 6 - 20 mg/dL 30(H) 32(H) 31(H)  Creatinine 0.61 - 1.24 mg/dL 1.28(H) 1.21 1.37(H)  Sodium 135 - 145 mmol/L 136 138 136  Potassium 3.5 - 5.1 mmol/L 5.0 5.0 5.3(H)  Chloride 98 - 111 mmol/L 102 105 103  CO2 22 - 32 mmol/L 25 24 25   Calcium 8.9 - 10.3 mg/dL 12.3(H) 12.1(H) 12.4(H)  Total Protein 6.5 - 8.1 g/dL 7.2 - 6.5  Total Bilirubin 0.3 - 1.2 mg/dL 11.8(H) - 9.2(HH)  Alkaline Phos 38 - 126 U/L 205(H) - 248(H)  AST 15 - 41 U/L 125(H) - 118(H)  ALT 0 - 44 U/L  53(H) - 50(H)     Urine Studies No results for input(s): UHGB, CRYS in the last 72 hours.  Invalid input(s): UACOL, UAPR, USPG, UPH, UTP, UGL, [place], UBIL, UNIT, UROB, [place], UEPI, [place], [place]  Basic Metabolic Panel: Recent Labs  Lab 07/26/18 0546 07/27/18 0554 07/31/18 1431 07/31/18 1817 08/01/18 0323  NA 132* 129* 136 138 136  K 4.6 4.4 5.3* 5.0 5.0  CL 101 97* 103 105 102  CO2 22 24 25 24 25   GLUCOSE 95 89 89 93 75  BUN 14 16 31* 32* 30*  CREATININE 1.03 1.08 1.37* 1.21 1.28*  CALCIUM 10.7* 11.4* 12.4* 12.1* 12.3*  MG 2.2 2.3  --   --   --    GFR Estimated Creatinine Clearance: 74.4 mL/min (A) (by C-G formula based on SCr of 1.28 mg/dL (H)). Liver Function Tests: Recent Labs  Lab 07/26/18 0546 07/31/18 1431 08/01/18 0323  AST 74* 118* 125*  ALT 42 50* 53*  ALKPHOS 233* 248* 205*  BILITOT 3.8* 9.2* 11.8*  PROT 6.1* 6.5 7.2  ALBUMIN 2.4* 1.9* 2.4*   Recent Labs  Lab 07/31/18 1818  LIPASE 52*   Recent Labs  Lab 07/31/18 1444  AMMONIA  24   Coagulation profile No results for input(s): INR, PROTIME in the last 168 hours.  CBC: Recent Labs  Lab 07/26/18 0546 07/27/18 0554 07/31/18 1007 08/01/18 0323  WBC 15.9* 15.3* 22.0* 22.2*  NEUTROABS 11.5*  --   --   --   HGB 10.3* 10.1* 8.8* 8.5*  HCT 31.8* 30.9* 27.3* 26.8*  MCV 84.6 84.2 83.7 84.0  PLT 319 360 432* 441*   Cardiac Enzymes: No results for input(s): CKTOTAL, CKMB, CKMBINDEX, TROPONINI in the last 168 hours. BNP: Invalid input(s): POCBNP CBG: No results for input(s): GLUCAP in the last 168 hours. D-Dimer No results for input(s): DDIMER in the last 72 hours. Hgb A1c No results for input(s): HGBA1C in the last 72 hours. Lipid Profile No results for input(s): CHOL, HDL, LDLCALC, TRIG, CHOLHDL, LDLDIRECT in the last 72 hours. Thyroid function studies No results for input(s): TSH, T4TOTAL, T3FREE, THYROIDAB in the last 72 hours.  Invalid input(s): FREET3 Anemia work  up No results for input(s): VITAMINB12, FOLATE, FERRITIN, TIBC, IRON, RETICCTPCT in the last 72 hours. Microbiology Recent Results (from the past 240 hour(s))  Culture, Blood     Status: None (Preliminary result)   Collection Time: 07/31/18 11:34 AM  Result Value Ref Range Status   Specimen Description   Final    BLOOD LEFT ARM Performed at [facility], [place] [address] [place], [place] [address]    Special Requests   Final    NONE Performed at [facility], [place] [address] [place], [place] [address]    Culture   Final    NO GROWTH < 24 HOURS Performed at [place] [address], [place], [place] [address]    Report Status PENDING  Incomplete  Culture, Blood     Status: None (Preliminary result)   Collection Time: 07/31/18 11:38 AM  Result Value Ref Range Status   Specimen Description   Final    BLOOD RIGHT ARM Performed at [facility], [place] [address], [place], [place] [address]    Special Requests   Final    NONE Performed at [facility], [place] [address] [place], [place] [address]    Culture   Final    NO GROWTH < 24 HOURS Performed at [place] [address], [place],  [address]    Report Status PENDING  Incomplete  MRSA PCR Screening     Status: None   Collection Time: 07/31/18 11:28 PM  Result Value Ref Range Status   MRSA by PCR NEGATIVE NEGATIVE Final    Comment:        The GeneXpert MRSA Assay (FDA approved for NASAL specimens only), is one component of a comprehensive MRSA colonization surveillance program. It is not intended to diagnose MRSA infection nor to guide or monitor treatment for MRSA infections. Performed at [facility], [place] [address], [place],  [address]       Studies:  Dg Chest 2 View  Result Date: 07/31/2018 CLINICAL DATA:  Fever.  Liver metastasis. EXAM: CHEST - 2 VIEW  COMPARISON:  None. FINDINGS: The heart size and mediastinal contours are within normal limits. Both lungs are clear. The lung volumes are low. The visualized skeletal structures are unremarkable. IMPRESSION: No active cardiopulmonary disease. Electronically Signed   By: Abelardo Diesel M.D.   On: 07/31/2018 18:43   Ct Abdomen Pelvis W Contrast  Result Date: 07/31/2018 CLINICAL DATA:  Abdominal distension. Hypotension and tachycardia. Elevated bilirubin. History of metastatic colon cancer. EXAM: CT ABDOMEN AND PELVIS WITH CONTRAST TECHNIQUE: Multidetector CT imaging of the abdomen and pelvis was performed using the standard protocol following bolus administration of intravenous contrast. CONTRAST:  133mL ISOVUE-300 IOPAMIDOL (ISOVUE-300) INJECTION 61% COMPARISON:  Abdominal MRI 07/23/2018. CT abdomen and pelvis 07/18/2018. FINDINGS: Lower chest: Consolidative opacities in the left greater than right lower lobes and lingula. No pleural effusion. Hepatobiliary: Numerous hypoattenuating lesions are again seen throughout the liver with some appearing mildly larger than on the prior studies (for example a 4.3 cm segment IVa lesion on series 3, image 33 which measured 3.7 cm on the prior CT). Layering hyperattenuating material in the gallbladder, possibly sludge. Possible mild gallbladder wall thickening. No biliary dilatation. Pancreas: Nearly completely resolved peripancreatic inflammation. No pancreatic ductal dilatation. Spleen: Unremarkable. Adrenals/Urinary Tract: Unremarkable adrenal glands. No evidence of renal mass, calculi, or hydronephrosis. Unremarkable bladder. Stomach/Bowel: Masslike wall thickening in the ascending colon as previously seen. No bowel obstruction. Unremarkable appendix. Vascular/Lymphatic: Normal caliber of the abdominal aorta. 3.9 x 2.8 cm right lower quadrant mesenteric lymph node, mildly enlarged from the prior CT (previously 3.2 x 2.5 cm). Reproductive: Unremarkable prostate. Other:  Small volume ascites. No pneumoperitoneum. Small fat containing umbilical hernia. Musculoskeletal: No suspicious osseous lesion. Severe L5-S1 disc space narrowing with vacuum disc and degenerative endplate sclerosis as well as asymmetric left neural foraminal stenosis. IMPRESSION: 1. Mild worsening of diffuse liver metastases and right lower quadrant mesenteric lymphadenopathy. 2. Ascending colon mass as previously described. 3. Nearly completely resolved pancreatic inflammation. 4. Left greater than right basilar lung opacities which may reflect pneumonia or atelectasis. 5. Persistent small volume ascites. Electronically Signed   By: Logan Bores M.D.   On: 07/31/2018 19:54   US Abdomen Limited Ruq  Result Date: 07/31/2018 CLINICAL DATA:  Worsened pain, jaundice and elevated white count. Metastatic colon cancer. EXAM: ULTRASOUND ABDOMEN LIMITED RIGHT UPPER QUADRANT COMPARISON:  Multiple CT and MR studies of the last 2 weeks. FINDINGS: Gallbladder: Gallbladder contains sludge but no shadowing stones. No Murphy sign. Maximal wall thickness 3 mm. No surrounding fluid. Common bile duct: Diameter: 6 mm Liver: Innumerable metastatic lesions throughout the liver. Previously evaluated by CT. Portal vein is patent on color Doppler imaging with normal direction of blood flow towards the liver. IMPRESSION: Widespread and advanced hepatic metastatic disease as known by previous imaging studies. The gallbladder contains sludge but no shadowing stones. There is no Murphy sign. Borderline gallbladder wall thickness but no surrounding fluid. The overall balance of the information argues against cholecystitis. Electronically Signed   By: Nelson Chimes M.D.   On: 07/31/2018 20:43    Assessment: 52 y.o. male with history of HTN, presented with 3 weeks history of epigastric pain, intermittent nausea and vomiting  1. Fever and acute encephalopathy, probable infection and sepsis  2.  Severe hyperbilirubinemia, secondary to  diffuse liver metastasis  3. right colon cancer with diffuse metastasis to liver 4. Hypercalcemia  5. AKI  6.  Small bilateral pleural effusion 7. Anemia in neoplastic disease.  Plan:  -He is on broad antibiotics, which I agree, cultures are pending, negative so far. -His bilirubin continued worsening, total bilirubin 11.8 this morning, with mainlyy direct bilirubin -We have discussed his case in the GI tumor board this morning, he has no biliary dilatation on the scan ultrasound, not a candidate for any intervention for hyperbilirubinemia.  -If his hyperbilirubinemia does not improve with broad antibiotics, and cultures remain  to be negative, I plan to offer him 1 dose of chemotherapy on Friday, with dose reduced FOLFOX. I have spoken with our pharmacy today.  --Chemotherapy consent: Side effects including but does not not limited to, fatigue, nausea, vomiting, diarrhea, hair loss, neuropathy, cold sensitivity, fluid retention, renal and kidney dysfunction, neutropenic fever, needed for blood transfusion, bleeding, coronary artery spasm and heart failure, were discussed with patient in great detail. He agrees to proceed. -The goal of therapy is palliative, his metastatic cancer is incurable at this stage.  -We discussed that it is likely he is going to have significant side effects from chemo, due to his liver failure.  We also discussed the option of palliative care alone.  Given his young age, newly onset metastatic cancer, I will offer him chemo treatment in the hospital, despite poor PS, which we typically would not do.  Patient is in total agreement of trying chemo and voiced good understanding of the potential side effect from treatment. -I called his wife and discussed his overall extremely poor prognosis from his terminal metastatic cancer, and liver failure. Chemo plan discussed with her and she agrees.  -consider blood transfusion if Hb<8.0  -I will f/u daily.    Truitt Merle,  MD 08/01/2018

## 2018-08-01 NOTE — Progress Notes (Signed)
Pharmacy Antibiotic Note  Jeffery Roman is a 52 y.o. male admitted on 07/31/2018 with Intra-abdominal infection.  Pharmacy has been consulted for zosyn dosing.  Plan: Zosyn 3.375g IV q8h (4 hour infusion).  Height: 5\' 11"  (180.3 cm) Weight: 187 lb 13.3 oz (85.2 kg) IBW/kg (Calculated) : 75.3  Temp (24hrs), Avg:100 F (37.8 C), Min:99.2 F (37.3 C), Max:101 F (38.3 C)  Recent Labs  Lab 07/25/18 0552 07/26/18 0546 07/27/18 0554 07/31/18 1007 07/31/18 1431 07/31/18 1805 07/31/18 1817 07/31/18 2231  WBC 15.9* 15.9* 15.3* 22.0*  --   --   --   --   CREATININE 1.11 1.03 1.08  --  1.37*  --  1.21  --   LATICACIDVEN  --   --   --   --   --  2.98*  --  2.75*    Estimated Creatinine Clearance: 78.7 mL/min (by C-G formula based on SCr of 1.21 mg/dL).    Allergies  Allergen Reactions  . Ciprofloxacin Other (See Comments)    Too strong   . Lisinopril Other (See Comments)    ED    Antimicrobials this admission: Zosyn 07/31/2018 >>   Dose adjustments this admission: -  Microbiology results: -  Thank you for allowing pharmacy to be a part of this patient's care.  Nani Skillern Crowford 08/01/2018 3:35 AM

## 2018-08-01 NOTE — Progress Notes (Signed)
Initial Nutrition Assessment  DOCUMENTATION CODES:   Not applicable  INTERVENTION:   - Continue Ensure Enlive QID and Premier Protein BID - Continue MVI with minerals  NUTRITION DIAGNOSIS:   Increased nutrient needs related to cancer and cancer related treatments as evidenced by estimated needs.  GOAL:   Patient will meet greater than or equal to 90% of their needs  MONITOR:   PO intake, Supplement acceptance, Weight trends, Labs  REASON FOR ASSESSMENT:   Consult Assessment of nutrition requirement/status  ASSESSMENT:   52 yo male, admitted with bilirubinemia, colonic cancer. Recent admission 10/23-11/09/2017, diagnosed with colon cancer with mets to liver, acute pancreatitis. PMH significant for HTN.  Labs: BUN 30, Creatinine 1.28, ALP 205 (H), AST 125 (H), ALT 53 (H), dBilirubin 7.4, iBilirubin 4.4, tBilirubin 11.8, lactic acid 2.75, Hgb 8.5, Hct 26.8  Meds: Ensure Enlive QD, Premier Protein BID, MVI with minerals, miralax, senokot-S, NaCl 1000 mL bolus daily  Per notes from last admission, pt was educated on low fiber diet (12-13 g daily).  Pt switched from NPO to regular diet 11/6 at 9:52am. Waiting to order lunch until after pt transfers to Rm 1423. Pt had Premier Protein at bedside at time of visit and had just started drinking it.  Pt reports so-so appetite, and typically eats 2-3 meals daily. Pt is following a low fiber diet.  Unable to complete full assessment as pt needed assistance using the restroom and was preparing for room transfer. Will need NFPE at follow up visit.   Per chart, pt wt down 4.6 kg since 10/30 --> 5% wt loss over 1 week. Suspect protein-calorie malnutrition.  NUTRITION - FOCUSED PHYSICAL EXAM: Deferred d/t room transfer.  Diet Order:   Diet Order            Diet regular Room service appropriate? Yes; Fluid consistency: Thin  Diet effective now              EDUCATION NEEDS:  No education needs have been identified at this  time  Skin:  Skin Assessment: Reviewed RN Assessment  Last BM:  PTA  Height:  Ht Readings from Last 1 Encounters:  07/31/18 5\' 11"  (1.803 m)    Weight: Wt Readings from Last 1 Encounters:  07/31/18 85.2 kg    Ideal Body Weight:  78.2 kg  BMI:  Body mass index is 26.2 kg/m.  Estimated Nutritional Needs:   Kcal:  1694-5038 (25-30 kcal/kg ABW)  Protein:  117-141 g protein daily (1.5-1.8 g/kg IBW)  Fluid:  >/= 2.3 L daily (or per MD discretion)  Althea Grimmer, MS, RDN, LDN On-call pager: [phone]

## 2018-08-02 ENCOUNTER — Inpatient Hospital Stay: Payer: BLUE CROSS/BLUE SHIELD

## 2018-08-02 ENCOUNTER — Inpatient Hospital Stay: Payer: Self-pay

## 2018-08-02 ENCOUNTER — Telehealth: Payer: Self-pay

## 2018-08-02 DIAGNOSIS — J181 Lobar pneumonia, unspecified organism: Secondary | ICD-10-CM

## 2018-08-02 DIAGNOSIS — E440 Moderate protein-calorie malnutrition: Secondary | ICD-10-CM

## 2018-08-02 DIAGNOSIS — Z6826 Body mass index (BMI) 26.0-26.9, adult: Secondary | ICD-10-CM

## 2018-08-02 DIAGNOSIS — G893 Neoplasm related pain (acute) (chronic): Secondary | ICD-10-CM

## 2018-08-02 DIAGNOSIS — D473 Essential (hemorrhagic) thrombocythemia: Secondary | ICD-10-CM

## 2018-08-02 DIAGNOSIS — C182 Malignant neoplasm of ascending colon: Secondary | ICD-10-CM

## 2018-08-02 DIAGNOSIS — Z515 Encounter for palliative care: Secondary | ICD-10-CM

## 2018-08-02 DIAGNOSIS — K219 Gastro-esophageal reflux disease without esophagitis: Secondary | ICD-10-CM

## 2018-08-02 LAB — URINE CULTURE: Culture: NO GROWTH

## 2018-08-02 LAB — CBC
HCT: 22.7 % — ABNORMAL LOW (ref 39.0–52.0)
Hemoglobin: 7.2 g/dL — ABNORMAL LOW (ref 13.0–17.0)
MCH: 27 pg (ref 26.0–34.0)
MCHC: 31.7 g/dL (ref 30.0–36.0)
MCV: 85 fL (ref 80.0–100.0)
PLATELETS: 368 10*3/uL (ref 150–400)
RBC: 2.67 MIL/uL — ABNORMAL LOW (ref 4.22–5.81)
RDW: 15.5 % (ref 11.5–15.5)
WBC: 17.7 10*3/uL — ABNORMAL HIGH (ref 4.0–10.5)
nRBC: 0.3 % — ABNORMAL HIGH (ref 0.0–0.2)

## 2018-08-02 LAB — COMPREHENSIVE METABOLIC PANEL
ALK PHOS: 165 U/L — AB (ref 38–126)
ALT: 53 U/L — AB (ref 0–44)
AST: 133 U/L — ABNORMAL HIGH (ref 15–41)
Albumin: 1.7 g/dL — ABNORMAL LOW (ref 3.5–5.0)
Anion gap: 8 (ref 5–15)
BILIRUBIN TOTAL: 8.7 mg/dL — AB (ref 0.3–1.2)
BUN: 32 mg/dL — ABNORMAL HIGH (ref 6–20)
CALCIUM: 10.9 mg/dL — AB (ref 8.9–10.3)
CO2: 23 mmol/L (ref 22–32)
CREATININE: 1.16 mg/dL (ref 0.61–1.24)
Chloride: 107 mmol/L (ref 98–111)
Glucose, Bld: 88 mg/dL (ref 70–99)
Potassium: 4.3 mmol/L (ref 3.5–5.1)
Sodium: 138 mmol/L (ref 135–145)
Total Protein: 5.9 g/dL — ABNORMAL LOW (ref 6.5–8.1)

## 2018-08-02 LAB — CALCIUM, IONIZED: Calcium, Ionized, Serum: 7 mg/dL — ABNORMAL HIGH (ref 4.5–5.6)

## 2018-08-02 MED ORDER — SENNOSIDES-DOCUSATE SODIUM 8.6-50 MG PO TABS: 1.0000 | ORAL_TABLET | Freq: Every evening | ORAL | Status: DC | PRN

## 2018-08-02 MED ORDER — LEVOFLOXACIN 750 MG PO TABS
750.0000 mg | ORAL_TABLET | Freq: Every day | ORAL | Status: AC
  Administered 2018-08-03 – 2018-08-04 (×2): 750 mg via ORAL
  Filled 2018-08-02 (×2): qty 1

## 2018-08-02 MED ORDER — POLYETHYLENE GLYCOL 3350 17 G PO PACK
17.0000 g | PACK | Freq: Every day | ORAL | Status: DC | PRN
  Administered 2018-08-03: 17 g via ORAL
  Filled 2018-08-02: qty 1

## 2018-08-02 MED ORDER — SODIUM CHLORIDE 0.9% FLUSH: 10.0000 mL | INTRAVENOUS | Status: DC | PRN

## 2018-08-02 MED ORDER — SODIUM CHLORIDE 0.9 % IV SOLN
INTRAVENOUS | Status: AC
  Administered 2018-08-02 (×2): via INTRAVENOUS

## 2018-08-02 NOTE — Telephone Encounter (Signed)
Left voice message for Jeffery Roman CNS that Dr. Burr Medico is requested chemo education for this patient while in the hospital on Folfox.  Requested return call.

## 2018-08-02 NOTE — Progress Notes (Signed)
These preliminary result these preliminary results were noted.  Awaiting final report.

## 2018-08-02 NOTE — Consult Note (Signed)
Consultation Note Date: 08/03/2018   Patient Name: Jeffery Roman  DOB: 1966-01-29  MRN: [id number]  Age / Sex: 52 y.o., male  PCP: Alroy Dust, L.Marlou Sa, MD Referring Physician: Edwin Dada, *  Reason for Consultation: Establishing goals of care, Non pain symptom management and Pain control  HPI/Patient Profile: 52 y.o. male  with past medical history of recently diagnosed metastatic colon cancer with liver mets admitted on 07/31/2018 with epigastric pain, intermittent nausea and vomiting who was found to have fever, encephalopathy, and severe hyperbilirubinemia likely secondary to diffuse liver metastasis.  There was not obvious obstruction that would be amenable to stenting or drain placement.  Plan for first dose chemotherapy in hospital tomorrow.  Palliative consulted for assistance with pain management as well as initial discussions regarding advanced care planning and family support.   Clinical Assessment and Goals of Care: Palliative medicine consult received.  Chart reviewed.  Discussed with bedside RN.  I met today with Jeffery Roman and his wife.   I introduced palliative care as specialized medical care for people living with serious illness. It focuses on providing relief from the symptoms and stress of a serious illness. The goal is to improve quality of life for both the patient and the family.  Values and goals of care important to patient and family were attempted to be elicited.  He reports the most important thing to him is his family.  He has 2 children (ages 86 and 23) and he and his wife are both concerned about them as he progresses forward with his new diagnosis of metastatic cancer.  We discussed his pain management and he reports that he has intermittent sharp pain.  He currently rates his pain as 3 out of 10 and states that this is not bad enough that he would ask for pain medication.   We discussed pain management at home.  He had been using oxycodone 5 mg 3 times per day without good relief of his pain.  States that he felt the dose of medication may be appropriate, however, he was not able to take it frequently enough.  We talked about options for pain management and he would like to stay with oxycodone at this time.  He also has back-up IV Dilaudid if needed.  We discussed plan for him to continue to use these as needed over the next few days at which point time will have a better idea of his overall pain management needs when it comes closer time for discharge home.  Questions and concerns addressed.   PMT will continue to support holistically.  SUMMARY OF RECOMMENDATIONS   - Pain: Discussed options for pain management.  At this point, Jeffery Roman reports that his pain has been fairly well controlled on current regimen.  He would like to continue with the same over the next couple of days and further discuss changes once it is better established how much medication he will require for pain management moving forward. - We also began some initial conversations regarding long-term  prognosis and advanced care planning.  He confirms that his wife would be his surrogate decision-maker.  We also began some gentle conversations regarding the fact that this is a terminal illness and the future will have decisions regarding what sort of care is likely to add quality time to his life.  At this point, he is focused on receiving chemotherapy with hopeful improvement in his condition and functional status. - He has 2 children (17 and 12) and he and his wife are both concerned about sharing information with them and supporting them through this illness. - Palliative will continue to follow for further pain management needs and conversation based on his clinical course.  Code Status/Advance Care Planning:  Full code   Symptom Management:   As above  Palliative Prophylaxis:   Bowel Regimen  and Frequent Pain Assessment  Additional Recommendations (Limitations, Scope, Preferences):  Full Scope Treatment  Psycho-social/Spiritual:   Desire for further Chaplaincy support:Did not address today  Additional Recommendations: Caregiving  Support/Resources  Prognosis:   Unable to determine  Discharge Planning: To Be Determined      Primary Diagnoses: Present on Admission: . Bilirubinemia . Hypercalcemia . Cancer of right colon ([place])   I have reviewed the medical record, interviewed the patient and family, and examined the patient. The following aspects are pertinent.  Past Medical History:  Diagnosis Date  . Hypertension    Social History   Socioeconomic History  . Marital status: Married    Spouse name: Not on file  . Number of children: Not on file  . Years of education: Not on file  . Highest education level: Not on file  Occupational History  . Not on file  Social Needs  . Financial resource strain: Not on file  . Food insecurity:    Worry: Not on file    Inability: Not on file  . Transportation needs:    Medical: Not on file    Non-medical: Not on file  Tobacco Use  . Smoking status: Never Smoker  . Smokeless tobacco: Never Used  Substance and Sexual Activity  . Alcohol use: Not on file    Comment: twice per year  . Drug use: Never  . Sexual activity: Not on file  Lifestyle  . Physical activity:    Days per week: Not on file    Minutes per session: Not on file  . Stress: Not on file  Relationships  . Social connections:    Talks on phone: Not on file    Gets together: Not on file    Attends religious service: Not on file    Active member of club or organization: Not on file    Attends meetings of clubs or organizations: Not on file    Relationship status: Not on file  Other Topics Concern  . Not on file  Social History Narrative  . Not on file   Family History  Problem Relation Age of Onset  . Diabetes Mellitus II Mother   .  Prostate cancer Father   . Colon cancer Maternal Grandmother    Scheduled Meds: . sodium chloride   Intravenous Once  . chlorhexidine  15 mL Mouth Rinse BID  . enoxaparin (LOVENOX) injection  40 mg Subcutaneous Q24H  . feeding supplement (ENSURE ENLIVE)  237 mL Oral Daily  . FLUOROURACIL (ADRUCIL) CHEMO infusion For Inpatient Use  1,500 mg/m2 (Treatment Plan Recorded) Intravenous Once  . leucovorin (WELLCOVORIN) IV infusion  200 mg/m2 (Treatment Plan Recorded) Intravenous Once  .  levofloxacin  750 mg Oral Daily  . mouth rinse  15 mL Mouth Rinse q12n4p  . multivitamin with minerals  1 tablet Oral Daily  . oxaliplatin (ELOXATIN) CHEMO IV infusion  50 mg/m2 (Treatment Plan Recorded) Intravenous Once  . palonosetron  0.25 mg Intravenous Once  . protein supplement shake  11 oz Oral BID BM   Continuous Infusions: . dexamethasone (DECADRON) IVPB [facility]    . dextrose    . dextrose     PRN Meds:.HYDROmorphone (DILAUDID) injection, ondansetron (ZOFRAN) IV, oxyCODONE, polyethylene glycol, senna-docusate, sodium chloride flush Medications Prior to Admission:  Prior to Admission medications   Medication Sig Start Date End Date Taking? Authorizing Provider  amLODipine (NORVASC) 10 MG tablet Take 1 tablet (10 mg total) by mouth daily. 07/27/18  Yes Eugenie Filler, MD  feeding supplement, ENSURE ENLIVE, (ENSURE ENLIVE) LIQD Take 237 mLs by mouth daily. 07/28/18  Yes Eugenie Filler, MD  loratadine (CLARITIN) 10 MG tablet Take 10 mg by mouth daily.   Yes [provider]  Multiple Vitamin (MULTIVITAMIN WITH MINERALS) TABS tablet Take 1 tablet by mouth daily.   Yes [provider]  omeprazole (PRILOSEC) 20 MG capsule Take 20 mg by mouth daily.   Yes [provider]  ondansetron (ZOFRAN) 8 MG tablet Take 1 tablet (8 mg total) by mouth every 8 (eight) hours as needed for nausea or vomiting. 07/27/18  Yes Eugenie Filler, MD  oxyCODONE (OXY IR/ROXICODONE) 5 MG immediate  release tablet Take 1 tablet (5 mg total) by mouth every 4 (four) hours as needed for moderate pain. 07/27/18  Yes Eugenie Filler, MD  polyethylene glycol [facility] Floria Raveling) packet Take 17 g by mouth daily as needed for moderate constipation. 07/27/18  Yes Eugenie Filler, MD  protein supplement shake (PREMIER PROTEIN) LIQD Take 325 mLs (11 oz total) by mouth 2 (two) times daily between meals. 07/27/18  Yes Eugenie Filler, MD  prochlorperazine (COMPAZINE) 10 MG tablet Take 1 tablet (10 mg total) by mouth every 6 (six) hours as needed (NAUSEA). 07/27/18   Eugenie Filler, MD   Allergies  Allergen Reactions  . Ciprofloxacin Other (See Comments)    Too strong   . Lisinopril Other (See Comments)    ED   Review of Systems  Constitutional: Positive for appetite change, fatigue and unexpected weight change.  Gastrointestinal: Positive for abdominal distention and abdominal pain.  Skin: Positive for color change.  Psychiatric/Behavioral: Positive for sleep disturbance.   Physical Exam  General: Alert, awake, in no acute distress. Tired appearing.  HEENT: No bruits, no goiter, no JVD, + icterus Heart: Regular rate and rhythm. No murmur appreciated. Lungs: Good air movement, clear Abdomen: Soft, diffuse tender, nondistended, positive bowel sounds.  Ext: No significant edema Skin: Warm and dry. + jaundice Neuro: Grossly intact, nonfocal.   Vital Signs: BP (!) 159/89 (BP Location: Left Arm)   Pulse (!) 120   Temp 99.7 F (37.6 C) (Oral)   Resp 16   Ht '5\' 11"'  (1.803 m)   Wt 85.2 kg   SpO2 98%   BMI 26.20 kg/m  Pain Scale: 0-10   Pain Score: 3    SpO2: SpO2: 98 % O2 Device:SpO2: 98 % O2 Flow Rate: .   IO: Intake/output summary:   Intake/Output Summary (Last 24 hours) at 08/03/2018 0936 Last data filed at 08/03/2018 0528 Gross per 24 hour  Intake 2910.4 ml  Output -  Net 2910.4 ml    LBM:  Last BM Date: 08/01/18 Baseline Weight: Weight: 85.2 kg Most recent  weight: Weight: 85.2 kg     Palliative Assessment/Data:     Time In: 0900 Time Out: 1020  Time Total: 80 Greater than 50%  of this time was spent counseling and coordinating care related to the above assessment and plan.  Signed by: Micheline Rough, MD   Please contact Palliative Medicine Team phone at [phone] for questions and concerns.  For individual provider: See Shea Evans

## 2018-08-02 NOTE — Progress Notes (Addendum)
Jeffery Roman   DOB:11-Dec-1965   VO#:[id number]   [id number]#:[id number]  Oncology follow up   Subjective: Patient remains to be afebrile, no events overnight.  Overall feels slightly better.  Bilirubin improved today, cultures have been negative so far.  He was transferred to 4W.   Objective:  Vitals:   08/02/18 0900 08/02/18 1451  BP: 124/77 125/72  Pulse: 93 98  Resp: 20 20  Temp: 98.4 F (36.9 C) 97.7 F (36.5 C)  SpO2: 94% 98%    Body mass index is 26.2 kg/m.  Intake/Output Summary (Last 24 hours) at 08/02/2018 1644 Last data filed at 08/02/2018 1600 Gross per 24 hour  Intake 2610.4 ml  Output -  Net 2610.4 ml     Sclerae unicteric  Oropharynx clear  No peripheral adenopathy  Lungs clear -- no rales or rhonchi  Heart regular rate and rhythm  Abdomen benign  MSK no focal spinal tenderness, no peripheral edema  Neuro nonfocal   CBG (last 3)  No results for input(s): GLUCAP in the last 72 hours.   Labs:  Lab Results  Component Value Date   WBC 17.7 (H) 08/02/2018   HGB 7.2 (L) 08/02/2018   HCT 22.7 (L) 08/02/2018   MCV 85.0 08/02/2018   PLT 368 08/02/2018   NEUTROABS 11.5 (H) 07/26/2018   CMP Latest Ref Rng & Units 08/02/2018 08/01/2018 07/31/2018  Glucose 70 - 99 mg/dL 88 75 93  BUN 6 - 20 mg/dL 32(H) 30(H) 32(H)  Creatinine 0.61 - 1.24 mg/dL 1.16 1.28(H) 1.21  Sodium 135 - 145 mmol/L 138 136 138  Potassium 3.5 - 5.1 mmol/L 4.3 5.0 5.0  Chloride 98 - 111 mmol/L 107 102 105  CO2 22 - 32 mmol/L 23 25 24   Calcium 8.9 - 10.3 mg/dL 10.9(H) 12.3(H) 12.1(H)  Total Protein 6.5 - 8.1 g/dL 5.9(L) 7.2 -  Total Bilirubin 0.3 - 1.2 mg/dL 8.7(H) 11.8(H) -  Alkaline Phos 38 - 126 U/L 165(H) 205(H) -  AST 15 - 41 U/L 133(H) 125(H) -  ALT 0 - 44 U/L 53(H) 53(H) -     Urine Studies No results for input(s): UHGB, CRYS in the last 72 hours.  Invalid input(s): UACOL, UAPR, USPG, UPH, UTP, UGL, [place], UBIL, UNIT, UROB, [place], [place]on Par[place], [place]  Basic  Metabolic Panel: Recent Labs  Lab 07/27/18 0554 07/31/18 1431 07/31/18 1817 08/01/18 0323 08/02/18 0414  NA 129* 136 138 136 138  K 4.4 5.3* 5.0 5.0 4.3  CL 97* 103 105 102 107  CO2 24 25 24 25 23   GLUCOSE 89 89 93 75 88  BUN 16 31* 32* 30* 32*  CREATININE 1.08 1.37* 1.21 1.28* 1.16  CALCIUM 11.4* 12.4* 12.1* 12.3* 10.9*  MG 2.3  --   --   --   --    GFR Estimated Creatinine Clearance: 82 mL/min (by C-G formula based on SCr of 1.16 mg/dL). Liver Function Tests: Recent Labs  Lab 07/31/18 1431 08/01/18 0323 08/02/18 0414  AST 118* 125* 133*  ALT 50* 53* 53*  ALKPHOS 248* 205* 165*  BILITOT 9.2* 11.8* 8.7*  PROT 6.5 7.2 5.9*  ALBUMIN 1.9* 2.4* 1.7*   Recent Labs  Lab 07/31/18 1818  LIPASE 52*   Recent Labs  Lab 07/31/18 1444  AMMONIA 24   Coagulation profile No results for input(s): INR, PROTIME in the last 168 hours.  CBC: Recent Labs  Lab 07/27/18 0554 07/31/18 1007 08/01/18 0323 08/02/18 0414  WBC 15.3* 22.0*  22.2* 17.7*  HGB 10.1* 8.8* 8.5* 7.2*  HCT 30.9* 27.3* 26.8* 22.7*  MCV 84.2 83.7 84.0 85.0  PLT 360 432* 441* 368   Cardiac Enzymes: No results for input(s): CKTOTAL, CKMB, CKMBINDEX, TROPONINI in the last 168 hours. BNP: Invalid input(s): POCBNP CBG: No results for input(s): GLUCAP in the last 168 hours. D-Dimer No results for input(s): DDIMER in the last 72 hours. Hgb A1c No results for input(s): HGBA1C in the last 72 hours. Lipid Profile No results for input(s): CHOL, HDL, LDLCALC, TRIG, CHOLHDL, LDLDIRECT in the last 72 hours. Thyroid function studies No results for input(s): TSH, T4TOTAL, T3FREE, THYROIDAB in the last 72 hours.  Invalid input(s): FREET3 Anemia work up No results for input(s): VITAMINB12, FOLATE, FERRITIN, TIBC, IRON, RETICCTPCT in the last 72 hours. Microbiology Recent Results (from the past 240 hour(s))  Culture, Blood     Status: None (Preliminary result)   Collection Time: 07/31/18 11:34 AM  Result Value  Ref Range Status   Specimen Description   Final    BLOOD LEFT ARM Performed at [facility], [place] [address] [place], [place] [address]    Special Requests   Final    NONE Performed at [facility], [place] [address], [place], [place] [address]    Culture   Final    NO GROWTH 2 DAYS Performed at [place] [address], [place], [place] [address]    Report Status PENDING  Incomplete  Culture, Blood     Status: None (Preliminary result)   Collection Time: 07/31/18 11:38 AM  Result Value Ref Range Status   Specimen Description   Final    BLOOD RIGHT ARM Performed at [facility], [place] [address], [place], [place] [address]    Special Requests   Final    NONE Performed at [facility], [place] [address] [place], [place] [address]    Culture   Final    NO GROWTH 2 DAYS Performed at [place] [address] [place], [name] [address]    Report Status PENDING  Incomplete  Urine culture     Status: None   Collection Time: 07/31/18  5:50 PM  Result Value Ref Range Status   Specimen Description   Final    URINE, CLEAN CATCH Performed at [facility], [place] [address] [place], [place] [address]    Special Requests   Final    NONE Performed at [facility], [place] [address], [place], [place] [address]    Culture   Final    NO GROWTH Performed at [facility], [place] [address], [place], [place] [address]    Report Status 08/02/2018 FINAL  Final  MRSA PCR Screening     Status: None   Collection Time: 07/31/18 11:28 PM  Result Value Ref Range Status   MRSA by PCR NEGATIVE NEGATIVE Final    Comment:        The GeneXpert MRSA Assay (FDA approved for NASAL specimens only), is one component of a comprehensive MRSA colonization surveillance program. It is not intended to diagnose MRSA infection nor to guide or monitor  treatment for MRSA infections. Performed at [facility], [place] [address] [place], [place] [address]       Studies:  Dg Chest 2 View  Result Date: 07/31/2018 CLINICAL DATA:  Fever.  Liver metastasis. EXAM: CHEST - 2 VIEW COMPARISON:  None. FINDINGS: The heart size and mediastinal contours are within normal limits. Both lungs are clear. The lung volumes are low. The visualized skeletal structures are unremarkable. IMPRESSION: No active cardiopulmonary disease. Electronically Signed   By: Abelardo Diesel M.D.   On: 07/31/2018 18:43   Ct Abdomen Pelvis W Contrast  Result Date: 07/31/2018 CLINICAL DATA:  Abdominal distension. Hypotension and tachycardia. Elevated bilirubin. History of metastatic colon cancer. EXAM: CT ABDOMEN AND PELVIS WITH CONTRAST TECHNIQUE: Multidetector CT imaging of the abdomen and pelvis was performed using the standard protocol following bolus administration of intravenous contrast. CONTRAST:  120mL ISOVUE-300 IOPAMIDOL (ISOVUE-300) INJECTION 61% COMPARISON:  Abdominal MRI 07/23/2018. CT abdomen and pelvis 07/18/2018. FINDINGS: Lower chest: Consolidative opacities in the left greater than right lower lobes and lingula. No pleural effusion. Hepatobiliary: Numerous hypoattenuating lesions are again seen throughout the liver with some appearing mildly larger than on the prior studies (for example a 4.3 cm segment IVa lesion on series 3, image 33 which measured 3.7 cm on the prior CT). Layering hyperattenuating material in the gallbladder, possibly sludge. Possible mild gallbladder wall thickening. No biliary dilatation. Pancreas: Nearly completely resolved peripancreatic inflammation. No pancreatic ductal dilatation. Spleen: Unremarkable. Adrenals/Urinary Tract: Unremarkable adrenal glands. No evidence of renal mass, calculi, or hydronephrosis. Unremarkable bladder. Stomach/Bowel: Masslike wall thickening in the ascending colon as previously seen. No bowel  obstruction. Unremarkable appendix. Vascular/Lymphatic: Normal caliber of the abdominal aorta. 3.9 x 2.8 cm right lower quadrant mesenteric lymph node, mildly enlarged from the prior CT (previously 3.2 x 2.5 cm). Reproductive: Unremarkable prostate. Other: Small volume ascites. No pneumoperitoneum. Small fat containing umbilical hernia. Musculoskeletal: No suspicious osseous lesion. Severe L5-S1 disc space narrowing with vacuum disc and degenerative endplate sclerosis as well as asymmetric left neural foraminal stenosis. IMPRESSION: 1. Mild worsening of diffuse liver metastases and right lower quadrant mesenteric lymphadenopathy. 2. Ascending colon mass as previously described. 3. Nearly completely resolved pancreatic inflammation. 4. Left greater than right basilar lung opacities which may reflect pneumonia or atelectasis. 5. Persistent small volume ascites. Electronically Signed   By: Logan Bores M.D.   On: 07/31/2018 19:54   [place] Ekg Site Rite  Result Date: 08/02/2018 If Site Rite image not attached, placement could not be confirmed due to current cardiac rhythm.  US Abdomen Limited Ruq  Result Date: 07/31/2018 CLINICAL DATA:  Worsened pain, jaundice and elevated white count. Metastatic colon cancer. EXAM: ULTRASOUND ABDOMEN LIMITED RIGHT UPPER QUADRANT COMPARISON:  Multiple CT and MR studies of the last 2 weeks. FINDINGS: Gallbladder: Gallbladder contains sludge but no shadowing stones. No Murphy sign. Maximal wall thickness 3 mm. No surrounding fluid. Common bile duct: Diameter: 6 mm Liver: Innumerable metastatic lesions throughout the liver. Previously evaluated by CT. Portal vein is patent on color Doppler imaging with normal direction of blood flow towards the liver. IMPRESSION: Widespread and advanced hepatic metastatic disease as known by previous imaging studies. The gallbladder contains sludge but no shadowing stones. There is no Murphy sign. Borderline gallbladder wall thickness but no  surrounding fluid. The overall balance of the information argues against cholecystitis. Electronically Signed   By: Nelson Chimes M.D.   On: 07/31/2018 20:43    Assessment: 52 y.o. male with history of HTN, presented with 3 weeks history of epigastric pain, intermittent nausea and vomiting  1. Fever and acute encephalopathy, probable infection and sepsis, vs cancer related, improved   2.  Severe hyperbilirubinemia, secondary to diffuse liver metastasis, improved today   3. right colon cancer with  diffuse metastasis to liver 4. Hypercalcemia, improved  5. AKI, resolved  6.  Small bilateral pleural effusion 7. Anemia in neoplastic disease. 8.  Moderate calorie and protein malnutrition and deconditioning  Plan:  -His culture has been negative so far, primary team Dr. Loleta Books plans to stop IV antibiotics after today, which I agree. -His bilirubin has slightly improved, plan to start first cycle chemotherapy FOLFOX with dose reduction tomorrow.  I went over the potential side effects and management with patient and his wife at bedside again today, he voiced good understanding, and agrees to proceed. -the goal of chemo is palliative, patient understand that his cancer is not curable, and he has high risk for complications after chemo due to the significant liver dysfunction. -we also discussed small risk of tumor lysis from chemo, will monitor uric acid -Please place a PICC line for chemo and IVF  -consider blood transfusion if repeated Hb<8.0 tomorrow morning  -move pt to 6E for chemo tomorrow morning, I discussed with Dr. Loleta Books, and he agrees -I will f/u daily.    Truitt Merle, MD 08/02/2018

## 2018-08-02 NOTE — Progress Notes (Signed)
PROGRESS NOTE    Jeffery Roman  DGU:[id number] DOB: 04/16/1966 DOA: 07/31/2018 PCP: Alroy Dust, L.Marlou Sa, MD      Brief Narrative:  Jeffery Roman is a 52 y.o. M with HTN and newly diagnosed metastatic colon cancer who presented to [facility] for port placement, found to have fever, tachycardia, hyperCa and elevated bilirubin.   Assessment & Plan:  Rule out sepsis Possible pneumonia Presented with fever 102F, WBC 22K, lactic acid >2.  UA unremarkable, CXR clear.  CT abdomen ?pneumonia, although no respiratory symptoms.  Started on Zosyn.  No fever since admission.   Procal up.  GI consulted, does not suspect cholangitis, no indication for decompression. -Follow blood and urine culture -Transition to Levaquin tomorrow to finish 5 days   Hypercalcemia Pamidronate given.  Ca down to 10.9 -Continue IV fluids  -Trend Ca  Metastatic colon cancer Follows with Dr. Burr Medico.  Diagnosed with metastatic cancer in Oct 2019 due to obstructive pancreatitis, found incidentally.  Colon biopsy showed adenoCA, liver lesion biopsy same. -Continue IV Dilaudid q4 prn  -Continue home oxycodone 5 mg q4 prn -Consult Palliative Care for new severe diagnosis, also mainly for assistance with pain control regimen  AKI Baseline 1.0, creatinine normalized overnight  Hypertension BP normal to high -Resume amlodipine  Anemia of neoplastic disease Hgb stable to baseline.  No bleeding.  Severe protein calorie malnutrition  -Continue supplements       MDM and disposition: The below labs and imaging reports were reviewed and summarized above.  Medication management as above.  The patient has terminal cancer, presented with dehydration, possible pneumonia sepsis.  He was started on antibiotics and is improved with fluids and antibiotics. He remains severely debilitated and unable to walk more than a few feet.  Will place PICC today and start chemotherapy tomorrow, plan for monitoring over the weekend.          DVT prophylaxis: Lovenox Code Status: FULL Family Communication: Wife at bedside    Consultants:   Oncology  GI  Palliative  Procedures:   RUQ [place] 11/5 IMPRESSION: Widespread and advanced hepatic metastatic disease as known by previous imaging studies. The gallbladder contains sludge but no shadowing stones. There is no Murphy sign. Borderline gallbladder wall thickness but no surrounding fluid. The overall balance of the information argues against cholecystitis.   Electronically Signed   By: Nelson Chimes M.D.   On: 07/31/2018 20:43  Antimicrobials:   Zosyn 11/5 >> 11/7  Levaquin 11/7 >>   Subjective: No more fever.  Pain improved.  Has some diarrhea now.  No cough, sputum.  No vomiting.  No confusion.     Objective: Vitals:   08/01/18 2228 08/02/18 0035 08/02/18 0527 08/02/18 0900  BP:   125/76 124/77  Pulse:   96 93  Resp:   18 20  Temp: (!) 100.9 F (38.3 C) 98.9 F (37.2 C) 97.9 F (36.6 C) 98.4 F (36.9 C)  TempSrc: Oral Oral Oral Oral  SpO2:   93% 94%  Weight:      Height:       No intake or output data in the 24 hours ending 08/02/18 1416 Filed Weights   07/31/18 2325  Weight: 85.2 kg    Examination: General appearance: Thin adult male, appears tired, sitting in a chair.  No acute distress.  Appears tired. HEENT: Scleral icterus obvious, conjunctivo-, lids and lashes normal.  No nasal deformity, discharge, or epistaxis.  Lips moist, dentition normal, oropharynx moist, no oral lesions.  Hearing  normal. Skin: Warm and dry, moderate jaundice, no suspicious rashes. Cardiac: Tachycardic, regular, no murmurs appreciated.  JVP normal.  1+ low extremity edema Respiratory: Respiratory effort normal, lungs clear without rales or wheezes. Abdomen: Abdomen with voluntary guarding, no rebound or distention.  Diffuse tenderness to palpation. MSK: No deformities or effusions of the large joints, moderate loss of subcutaneous muscle mass and  fat. Neuro: Awake and alert, extraocular movements intact, shuffling gait, moves all extremities with slightly diminished but symmetric strength.  Speech fluent.    Psych: Sensorium intact and responding to questions, oriented to person, place, and time.  Attention normal, affect blunted.    Data Reviewed: I have personally reviewed following labs and imaging studies:  CBC: Recent Labs  Lab 07/27/18 0554 07/31/18 1007 08/01/18 0323 08/02/18 0414  WBC 15.3* 22.0* 22.2* 17.7*  HGB 10.1* 8.8* 8.5* 7.2*  HCT 30.9* 27.3* 26.8* 22.7*  MCV 84.2 83.7 84.0 85.0  PLT 360 432* 441* 213   Basic Metabolic Panel: Recent Labs  Lab 07/27/18 0554 07/31/18 1431 07/31/18 1817 08/01/18 0323 08/02/18 0414  NA 129* 136 138 136 138  K 4.4 5.3* 5.0 5.0 4.3  CL 97* 103 105 102 107  CO2 24 25 24 25 23   GLUCOSE 89 89 93 75 88  BUN 16 31* 32* 30* 32*  CREATININE 1.08 1.37* 1.21 1.28* 1.16  CALCIUM 11.4* 12.4* 12.1* 12.3* 10.9*  MG 2.3  --   --   --   --    GFR: Estimated Creatinine Clearance: 82 mL/min (by C-G formula based on SCr of 1.16 mg/dL). Liver Function Tests: Recent Labs  Lab 07/31/18 1431 08/01/18 0323 08/02/18 0414  AST 118* 125* 133*  ALT 50* 53* 53*  ALKPHOS 248* 205* 165*  BILITOT 9.2* 11.8* 8.7*  PROT 6.5 7.2 5.9*  ALBUMIN 1.9* 2.4* 1.7*   Recent Labs  Lab 07/31/18 1818  LIPASE 52*   Recent Labs  Lab 07/31/18 1444  AMMONIA 24   Coagulation Profile: No results for input(s): INR, PROTIME in the last 168 hours. Cardiac Enzymes: No results for input(s): CKTOTAL, CKMB, CKMBINDEX, TROPONINI in the last 168 hours. BNP (last 3 results) No results for input(s): PROBNP in the last 8760 hours. HbA1C: No results for input(s): HGBA1C in the last 72 hours. CBG: No results for input(s): GLUCAP in the last 168 hours. Lipid Profile: No results for input(s): CHOL, HDL, LDLCALC, TRIG, CHOLHDL, LDLDIRECT in the last 72 hours. Thyroid Function Tests: No results for  input(s): TSH, T4TOTAL, FREET4, T3FREE, THYROIDAB in the last 72 hours. Anemia Panel: No results for input(s): VITAMINB12, FOLATE, FERRITIN, TIBC, IRON, RETICCTPCT in the last 72 hours. Urine analysis:    Component Value Date/Time   COLORURINE AMBER (A) 07/31/2018 1750   APPEARANCEUR HAZY (A) 07/31/2018 1750   LABSPEC 1.020 07/31/2018 1750   PHURINE 5.0 07/31/2018 1750   GLUCOSEU NEGATIVE 07/31/2018 1750   HGBUR SMALL (A) 07/31/2018 1750   BILIRUBINUR SMALL (A) 07/31/2018 1750   KETONESUR NEGATIVE 07/31/2018 1750   PROTEINUR NEGATIVE 07/31/2018 1750   NITRITE NEGATIVE 07/31/2018 1750   LEUKOCYTESUR NEGATIVE 07/31/2018 1750   Sepsis Labs: @LABRCNTIP (procalcitonin:4,lacticacidven:4)  ) Recent Results (from the past 240 hour(s))  Culture, Blood     Status: None (Preliminary result)   Collection Time: 07/31/18 11:34 AM  Result Value Ref Range Status   Specimen Description   Final    BLOOD LEFT ARM Performed at [facility], [place] [address] [place], [place] [address]  Special Requests   Final    NONE Performed at [facility], [place] [address], [place], [place] [address]    Culture   Final    NO GROWTH 2 DAYS Performed at [facility] [address] [place], [place] [address]    Report Status PENDING  Incomplete  Culture, Blood     Status: None (Preliminary result)   Collection Time: 07/31/18 11:38 AM  Result Value Ref Range Status   Specimen Description   Final    BLOOD RIGHT ARM Performed at [facility], [place] [address], [place], [place] [address]    Special Requests   Final    NONE Performed at [facility], [place] [address] [place], [place] [address]    Culture   Final    NO GROWTH 2 DAYS Performed at [place] [address] [place], [place] [address]    Report Status PENDING  Incomplete  Urine culture     Status: None   Collection Time:  07/31/18  5:50 PM  Result Value Ref Range Status   Specimen Description   Final    URINE, CLEAN CATCH Performed at [facility], [place] [address] [place], [place] [address]    Special Requests   Final    NONE Performed at [facility], [place] [address] [place], [place] [address]    Culture   Final    NO GROWTH Performed at [facility], [place] [address], [place], [place] [address]    Report Status 08/02/2018 FINAL  Final  MRSA PCR Screening     Status: None   Collection Time: 07/31/18 11:28 PM  Result Value Ref Range Status   MRSA by PCR NEGATIVE NEGATIVE Final    Comment:        The GeneXpert MRSA Assay (FDA approved for NASAL specimens only), is one component of a comprehensive MRSA colonization surveillance program. It is not intended to diagnose MRSA infection nor to guide or monitor treatment for MRSA infections. Performed at [facility], [place] [address] [place], [place] [address]          Radiology Studies: Dg Chest 2 View  Result Date: 07/31/2018 CLINICAL DATA:  Fever.  Liver metastasis. EXAM: CHEST - 2 VIEW COMPARISON:  None. FINDINGS: The heart size and mediastinal contours are within normal limits. Both lungs are clear. The lung volumes are low. The visualized skeletal structures are unremarkable. IMPRESSION: No active cardiopulmonary disease. Electronically Signed   By: Abelardo Diesel M.D.   On: 07/31/2018 18:43   Ct Abdomen Pelvis W Contrast  Result Date: 07/31/2018 CLINICAL DATA:  Abdominal distension. Hypotension and tachycardia. Elevated bilirubin. History of metastatic colon cancer. EXAM: CT ABDOMEN AND PELVIS WITH CONTRAST TECHNIQUE: Multidetector CT imaging of the abdomen and pelvis was performed using the standard protocol following bolus administration of intravenous contrast. CONTRAST:  1106mL ISOVUE-300 IOPAMIDOL (ISOVUE-300) INJECTION 61% COMPARISON:  Abdominal MRI 07/23/2018. CT abdomen and  pelvis 07/18/2018. FINDINGS: Lower chest: Consolidative opacities in the left greater than right lower lobes and lingula. No pleural effusion. Hepatobiliary: Numerous hypoattenuating lesions are again seen throughout the liver with some appearing mildly larger than on the prior studies (for example a 4.3 cm segment IVa lesion on series 3, image 33 which measured 3.7 cm on the prior CT). Layering hyperattenuating material in the gallbladder, possibly sludge. Possible mild gallbladder wall thickening. No biliary dilatation. Pancreas: Nearly  completely resolved peripancreatic inflammation. No pancreatic ductal dilatation. Spleen: Unremarkable. Adrenals/Urinary Tract: Unremarkable adrenal glands. No evidence of renal mass, calculi, or hydronephrosis. Unremarkable bladder. Stomach/Bowel: Masslike wall thickening in the ascending colon as previously seen. No bowel obstruction. Unremarkable appendix. Vascular/Lymphatic: Normal caliber of the abdominal aorta. 3.9 x 2.8 cm right lower quadrant mesenteric lymph node, mildly enlarged from the prior CT (previously 3.2 x 2.5 cm). Reproductive: Unremarkable prostate. Other: Small volume ascites. No pneumoperitoneum. Small fat containing umbilical hernia. Musculoskeletal: No suspicious osseous lesion. Severe L5-S1 disc space narrowing with vacuum disc and degenerative endplate sclerosis as well as asymmetric left neural foraminal stenosis. IMPRESSION: 1. Mild worsening of diffuse liver metastases and right lower quadrant mesenteric lymphadenopathy. 2. Ascending colon mass as previously described. 3. Nearly completely resolved pancreatic inflammation. 4. Left greater than right basilar lung opacities which may reflect pneumonia or atelectasis. 5. Persistent small volume ascites. Electronically Signed   By: Logan Bores M.D.   On: 07/31/2018 19:54   [place] Ekg Site Rite  Result Date: 08/02/2018 If Site Rite image not attached, placement could not be confirmed due to current cardiac  rhythm.  US Abdomen Limited Ruq  Result Date: 07/31/2018 CLINICAL DATA:  Worsened pain, jaundice and elevated white count. Metastatic colon cancer. EXAM: ULTRASOUND ABDOMEN LIMITED RIGHT UPPER QUADRANT COMPARISON:  Multiple CT and MR studies of the last 2 weeks. FINDINGS: Gallbladder: Gallbladder contains sludge but no shadowing stones. No Murphy sign. Maximal wall thickness 3 mm. No surrounding fluid. Common bile duct: Diameter: 6 mm Liver: Innumerable metastatic lesions throughout the liver. Previously evaluated by CT. Portal vein is patent on color Doppler imaging with normal direction of blood flow towards the liver. IMPRESSION: Widespread and advanced hepatic metastatic disease as known by previous imaging studies. The gallbladder contains sludge but no shadowing stones. There is no Murphy sign. Borderline gallbladder wall thickness but no surrounding fluid. The overall balance of the information argues against cholecystitis. Electronically Signed   By: Nelson Chimes M.D.   On: 07/31/2018 20:43        Scheduled Meds: . chlorhexidine  15 mL Mouth Rinse BID  . enoxaparin (LOVENOX) injection  40 mg Subcutaneous Q24H  . feeding supplement (ENSURE ENLIVE)  237 mL Oral Daily  . mouth rinse  15 mL Mouth Rinse q12n4p  . multivitamin with minerals  1 tablet Oral Daily  . polyethylene glycol  17 g Oral Daily  . protein supplement shake  11 oz Oral BID BM  . senna-docusate  1 tablet Oral QHS   Continuous Infusions: . sodium chloride 100 mL/hr at 08/02/18 0330  . sodium chloride Stopped (08/01/18 1312)  . piperacillin-tazobactam (ZOSYN)  IV 3.375 g (08/02/18 1405)     LOS: 2 days    Time spent: 25 minutes    Edwin Dada, MD [name] 08/02/2018, 2:16 PM     Pager [phone] --- please page though AMION:  [email or web address] Password [email or web address] If 7PM-7AM, please contact night-coverage

## 2018-08-02 NOTE — Progress Notes (Signed)
Peripherally Inserted Central Catheter/Midline Placement  The IV Nurse has discussed with the patient and/or persons authorized to consent for the patient, the purpose of this procedure and the potential benefits and risks involved with this procedure.  The benefits include less needle sticks, lab draws from the catheter, and the patient may be discharged home with the catheter. Risks include, but not limited to, infection, bleeding, blood clot (thrombus formation), and puncture of an artery; nerve damage and irregular heartbeat and possibility to perform a PICC exchange if needed/ordered by physician.  Alternatives to this procedure were also discussed.  Bard Power PICC patient education guide, fact sheet on infection prevention and patient information card has been provided to patient /or left at bedside.    PICC/Midline Placement Documentation        Jeffery Roman 08/02/2018, 6:51 PM

## 2018-08-03 LAB — COMPREHENSIVE METABOLIC PANEL
ALBUMIN: 1.7 g/dL — AB (ref 3.5–5.0)
ALK PHOS: 157 U/L — AB (ref 38–126)
ALT: 63 U/L — AB (ref 0–44)
AST: 140 U/L — AB (ref 15–41)
Anion gap: 7 (ref 5–15)
BUN: 28 mg/dL — AB (ref 6–20)
CALCIUM: 10.2 mg/dL (ref 8.9–10.3)
CO2: 22 mmol/L (ref 22–32)
CREATININE: 1.08 mg/dL (ref 0.61–1.24)
Chloride: 110 mmol/L (ref 98–111)
GFR calc Af Amer: >60 mL/min (ref >60)
GFR calc non Af Amer: >60 mL/min (ref >60)
GLUCOSE: 92 mg/dL (ref 70–99)
Potassium: 3.9 mmol/L (ref 3.5–5.1)
Sodium: 139 mmol/L (ref 135–145)
Total Bilirubin: 8.2 mg/dL — ABNORMAL HIGH (ref 0.3–1.2)
Total Protein: 5.9 g/dL — ABNORMAL LOW (ref 6.5–8.1)

## 2018-08-03 LAB — CBC
HCT: 22.9 % — ABNORMAL LOW (ref 39.0–52.0)
Hemoglobin: 7.4 g/dL — ABNORMAL LOW (ref 13.0–17.0)
MCH: 27.3 pg (ref 26.0–34.0)
MCHC: 32.3 g/dL (ref 30.0–36.0)
MCV: 84.5 fL (ref 80.0–100.0)
PLATELETS: 367 10*3/uL (ref 150–400)
RBC: 2.71 MIL/uL — ABNORMAL LOW (ref 4.22–5.81)
RDW: 15.7 % — AB (ref 11.5–15.5)
WBC: 15.9 10*3/uL — ABNORMAL HIGH (ref 4.0–10.5)
nRBC: 0.8 % — ABNORMAL HIGH (ref 0.0–0.2)

## 2018-08-03 LAB — PROCALCITONIN: Procalcitonin: 7.85 ng/mL

## 2018-08-03 LAB — PREPARE RBC (CROSSMATCH)

## 2018-08-03 LAB — ABO/RH: ABO/RH(D): A POS

## 2018-08-03 LAB — URIC ACID: Uric Acid, Serum: 3 mg/dL — ABNORMAL LOW (ref 3.7–8.6)

## 2018-08-03 MED ORDER — SODIUM CHLORIDE 0.9% IV SOLUTION
Freq: Once | INTRAVENOUS | Status: AC
  Administered 2018-08-03: 13:00:00 via INTRAVENOUS

## 2018-08-03 MED ORDER — SODIUM CHLORIDE 0.9 % IV SOLN: 1500.0000 mg/m2 | INTRAVENOUS | Status: DC

## 2018-08-03 MED ORDER — DEXTROSE 5 % IV SOLN: Freq: Once | INTRAVENOUS | Status: AC

## 2018-08-03 MED ORDER — LEUCOVORIN CALCIUM INJECTION 350 MG
200.0000 mg/m2 | Freq: Once | INTRAVENOUS | Status: AC
  Administered 2018-08-03: 424 mg via INTRAVENOUS
  Filled 2018-08-03: qty 20

## 2018-08-03 MED ORDER — OXALIPLATIN CHEMO INJECTION 100 MG/20ML
50.0000 mg/m2 | Freq: Once | INTRAVENOUS | Status: AC
  Administered 2018-08-03: 105 mg via INTRAVENOUS
  Filled 2018-08-03: qty 21

## 2018-08-03 MED ORDER — PALONOSETRON HCL INJECTION 0.25 MG/5ML
0.2500 mg | Freq: Once | INTRAVENOUS | Status: AC
  Administered 2018-08-03: 0.25 mg via INTRAVENOUS
  Filled 2018-08-03: qty 5

## 2018-08-03 MED ORDER — PROCHLORPERAZINE MALEATE 10 MG PO TABS
10.0000 mg | ORAL_TABLET | Freq: Four times a day (QID) | ORAL | Status: DC | PRN
  Administered 2018-08-06: 10 mg via ORAL
  Filled 2018-08-03: qty 1

## 2018-08-03 MED ORDER — AMLODIPINE BESYLATE 10 MG PO TABS
10.0000 mg | ORAL_TABLET | Freq: Every day | ORAL | Status: DC
  Administered 2018-08-03 – 2018-08-06 (×4): 10 mg via ORAL
  Filled 2018-08-03 (×4): qty 1

## 2018-08-03 MED ORDER — SODIUM CHLORIDE 0.9 % IV SOLN
10.0000 mg | Freq: Once | INTRAVENOUS | Status: AC
  Administered 2018-08-03: 10 mg via INTRAVENOUS
  Filled 2018-08-03: qty 1

## 2018-08-03 MED ORDER — DEXTROSE 5 % IV SOLN
Freq: Once | INTRAVENOUS | Status: AC
  Administered 2018-08-03: 16:00:00 via INTRAVENOUS

## 2018-08-03 MED ORDER — SODIUM CHLORIDE 0.9 % IV SOLN
1500.0000 mg/m2 | Freq: Once | INTRAVENOUS | Status: AC
  Administered 2018-08-03: 3200 mg via INTRAVENOUS
  Filled 2018-08-03: qty 64

## 2018-08-03 NOTE — Progress Notes (Addendum)
Jeffery Roman   DOB:[birth year]   TK#:[id number]   XNT#:[id number]  Oncology follow up   Subjective: No event overnight, afebrile, VS stable, no BM today. No signs of bleeding. Appetite low, sitting in chair when I saw him. Multiple family member at bedside   Objective:  Vitals:   08/03/18 1559 08/03/18 2003  BP: (!) 150/91 (!) 153/90  Pulse: (!) 111 (!) 108  Resp:  18  Temp:  98.3 F (36.8 C)  SpO2:  100%    Body mass index is 26.2 kg/m.  Intake/Output Summary (Last 24 hours) at 08/03/2018 2121 Last data filed at 08/03/2018 1841 Gross per 24 hour  Intake 1662.1 ml  Output -  Net 1662.1 ml     Sclerae icteric   Oropharynx clear  No peripheral adenopathy  Lungs clear -- no rales or rhonchi  Heart regular rate and rhythm  Abdomen soft   MSK no focal spinal tenderness, no peripheral edema  Neuro nonfocal   CBG (last 3)  No results for input(s): GLUCAP in the last 72 hours.   Labs:  Lab Results  Component Value Date   WBC 15.9 (H) 08/03/2018   HGB 7.4 (L) 08/03/2018   HCT 22.9 (L) 08/03/2018   MCV 84.5 08/03/2018   PLT 367 08/03/2018   NEUTROABS 11.5 (H) 07/26/2018   CMP Latest Ref Rng & Units 08/03/2018 08/02/2018 08/01/2018  Glucose 70 - 99 mg/dL 92 88 75  BUN 6 - 20 mg/dL 28(H) 32(H) 30(H)  Creatinine 0.61 - 1.24 mg/dL 1.08 1.16 1.28(H)  Sodium 135 - 145 mmol/L 139 138 136  Potassium 3.5 - 5.1 mmol/L 3.9 4.3 5.0  Chloride 98 - 111 mmol/L 110 107 102  CO2 22 - 32 mmol/L 22 23 25   Calcium 8.9 - 10.3 mg/dL 10.2 10.9(H) 12.3(H)  Total Protein 6.5 - 8.1 g/dL 5.9(L) 5.9(L) 7.2  Total Bilirubin 0.3 - 1.2 mg/dL 8.2(H) 8.7(H) 11.8(H)  Alkaline Phos 38 - 126 U/L 157(H) 165(H) 205(H)  AST 15 - 41 U/L 140(H) 133(H) 125(H)  ALT 0 - 44 U/L 63(H) 53(H) 53(H)     Urine Studies No results for input(s): UHGB, CRYS in the last 72 hours.  Invalid input(s): UACOL, UAPR, USPG, UPH, UTP, UGL, [place], UBIL, UNIT, UROB, [place], UEPI, UWBC, Duwayne Heck [place], [place]  Basic  Metabolic Panel: Recent Labs  Lab 07/31/18 1431 07/31/18 1817 08/01/18 0323 08/02/18 0414 08/03/18 0612  NA 136 138 136 138 139  K 5.3* 5.0 5.0 4.3 3.9  CL 103 105 102 107 110  CO2 25 24 25 23 22   GLUCOSE 89 93 75 88 92  BUN 31* 32* 30* 32* 28*  CREATININE 1.37* 1.21 1.28* 1.16 1.08  CALCIUM 12.4* 12.1* 12.3* 10.9* 10.2   GFR Estimated Creatinine Clearance: 88.1 mL/min (by C-G formula based on SCr of 1.08 mg/dL). Liver Function Tests: Recent Labs  Lab 07/31/18 1431 08/01/18 0323 08/02/18 0414 08/03/18 0612  AST 118* 125* 133* 140*  ALT 50* 53* 53* 63*  ALKPHOS 248* 205* 165* 157*  BILITOT 9.2* 11.8* 8.7* 8.2*  PROT 6.5 7.2 5.9* 5.9*  ALBUMIN 1.9* 2.4* 1.7* 1.7*   Recent Labs  Lab 07/31/18 1818  LIPASE 52*   Recent Labs  Lab 07/31/18 1444  AMMONIA 24   Coagulation profile No results for input(s): INR, PROTIME in the last 168 hours.  CBC: Recent Labs  Lab 07/31/18 1007 08/01/18 0323 08/02/18 0414 08/03/18 0612  WBC 22.0* 22.2* 17.7* 15.9*  HGB 8.8* 8.5*  7.2* 7.4*  HCT 27.3* 26.8* 22.7* 22.9*  MCV 83.7 84.0 85.0 84.5  PLT 432* 441* 368 367   Cardiac Enzymes: No results for input(s): CKTOTAL, CKMB, CKMBINDEX, TROPONINI in the last 168 hours. BNP: Invalid input(s): POCBNP CBG: No results for input(s): GLUCAP in the last 168 hours. D-Dimer No results for input(s): DDIMER in the last 72 hours. Hgb A1c No results for input(s): HGBA1C in the last 72 hours. Lipid Profile No results for input(s): CHOL, HDL, LDLCALC, TRIG, CHOLHDL, LDLDIRECT in the last 72 hours. Thyroid function studies No results for input(s): TSH, T4TOTAL, T3FREE, THYROIDAB in the last 72 hours.  Invalid input(s): FREET3 Anemia work up No results for input(s): VITAMINB12, FOLATE, FERRITIN, TIBC, IRON, RETICCTPCT in the last 72 hours. Microbiology Recent Results (from the past 240 hour(s))  Culture, Blood     Status: None (Preliminary result)   Collection Time: 07/31/18 11:34 AM   Result Value Ref Range Status   Specimen Description   Final    BLOOD LEFT ARM Performed at [facility], [place] [address] [place], [place] [address]    Special Requests   Final    NONE Performed at [facility], [place] [address] [place], [place] [address]    Culture   Final    NO GROWTH 3 DAYS Performed at [facility], [place] [address] [place], [place] [address]    Report Status PENDING  Incomplete  Culture, Blood     Status: None (Preliminary result)   Collection Time: 07/31/18 11:38 AM  Result Value Ref Range Status   Specimen Description   Final    BLOOD RIGHT ARM Performed at [facility], [place] [address] [place], [place] [address]    Special Requests   Final    NONE Performed at [facility], [place] [address], [place], [place] [address]    Culture   Final    NO GROWTH 3 DAYS Performed at [facility], [place] [address] [place], [place] [address]    Report Status PENDING  Incomplete  Urine culture     Status: None   Collection Time: 07/31/18  5:50 PM  Result Value Ref Range Status   Specimen Description   Final    URINE, CLEAN CATCH Performed at [facility], [place] [address] [place], [place] [address]    Special Requests   Final    NONE Performed at [facility], [place] [address] [place] [address], [name], [place] [address]    Culture   Final    NO GROWTH Performed at [facility], [place] [address], [place], [place] [address]    Report Status 08/02/2018 FINAL  Final  MRSA PCR Screening     Status: None   Collection Time: 07/31/18 11:28 PM  Result Value Ref Range Status   MRSA by PCR NEGATIVE NEGATIVE Final    Comment:        The GeneXpert MRSA Assay (FDA approved for NASAL specimens only), is one component of a comprehensive MRSA colonization surveillance program. It is not intended to diagnose MRSA infection nor to guide  or monitor treatment for MRSA infections. Performed at [facility], [place] [address], [place], [place] [address]       Studies:  [place] Ekg Site Rite  Result Date: 08/02/2018 If [facility] image not attached, placement could not be confirmed due to current cardiac rhythm.   Assessment: 51  y.o. male with history of HTN, presented with 3 weeks history of epigastric pain, intermittent nausea and vomiting  1. Fever and acute encephalopathy, probable infection and sepsis, vs cancer related, improved   2.  Severe hyperbilirubinemia, secondary to diffuse liver metastasis, improved slightly  3. right colon cancer with diffuse metastasis to liver 4. Hypercalcemia, improved  5. AKI, resolved  6.  Small bilateral pleural effusion 7. Worsening anemia, secondary to colon cancer, acute illness, rule out hemolysis  8.  Moderate calorie and protein malnutrition and deconditioning 9.  DVT prophylaxis: lovenox   Plan:  -lab reviewed this morning, will start cycle chemo FOLFOX today with 40-50% dose reduction due to his abnormal LFTs. Chemo consent obtained. Chemo infusion will complete on Sunday  -due to his worsening anemia, he received 1u blood today, may need more, please keep Hb>8.0  -previous iron study showed low serum iron and transferrin saturation, ferritin level was high on July 24, 2018, consistent with anemia of iron deficiency (tumor related GI bleeding) and anemia of chronic disease (cancer). However her anemia has gotten significantly worse since this admission, I will check reticular count, haptoglobin, LDH, B12, folate, and DIC panel to rule out other causes of anemia  -f/u CBC and CMP daily, I will check uric acid tomorrow also  -please consider one dose iv feraheme tomorrow  -My partner Dr. Benay Spice will see pt tomorrow. I will be back on Monday.    Truitt Merle, MD 08/03/2018

## 2018-08-03 NOTE — Progress Notes (Signed)
Spoke w/ Dr. Burr Medico this morning. Okay to start treatment today with reduced-dose FOLFOX. Fluorouracil dose will be reduced to 1500 mg/m2 (over 48 hours) given patient's elevated T bili.   Demetrius Charity, PharmD, [place] [facility] Pharmacist Pharmacy Phone: [phone] 08/03/2018

## 2018-08-03 NOTE — Progress Notes (Signed)
Chemotherapy normal dosing for Oxaliplatin and Fluorouracil were checked with Laural Benes, RN based on BSA.

## 2018-08-03 NOTE — Evaluation (Addendum)
Physical Therapy Evaluation Patient Details Name: Jeffery Roman MRN: [id number] DOB: [birth year] Today's Date: 08/03/2018   History of Present Illness  52 YO male admitted to ED from cancer center 11/5 for bilirubinemia, possible PNA. Pt with active metastatic colon cancer with mets to liver, starting chemo 11/8. PMH includes acute pancreatitis, ARF, HTN.   Clinical Impression   Pt presents with LE weakness, impaired standing balance, increased time and effort performing mobility tasks, and decreased balance with ambulation, and decreased tolerance for activity. Pt to benefit from acute PT to address deficits. Pt ambulated 350 ft with no AD with standing rest break to recover dyspnea (2/4), HR (up to 140 bpm), and SPO2 (89-94%, RA). PT recommending HHPT to address functional deficits in the home. Wife at bedside and very supportive. Pt given LE exercises to perform daily to decrease LE swelling via muscular pumping and strengthen LEs.  PT to progress mobility as tolerated, and will continue to follow acutely.      Follow Up Recommendations Supervision - Intermittent;Home health PT    Equipment Recommendations  None recommended by PT(to reassess next visit )    Recommendations for Other Services       Precautions / Restrictions Precautions Precautions: Fall Restrictions Weight Bearing Restrictions: No      Mobility  Bed Mobility Overal bed mobility: Needs Assistance Bed Mobility: Supine to Sit     Supine to sit: Min assist;HOB elevated     General bed mobility comments: Min assist for scooting to EOB, pt with increased time and effort to come to sitting position.   Transfers Overall transfer level: Needs assistance Equipment used: None Transfers: Sit to/from Stand Sit to Stand: Min guard;From elevated surface         General transfer comment: Increased time and effort, pt with self-steadying upon standing.  Ambulation/Gait Ambulation/Gait assistance: Min guard Gait  Distance (Feet): 350 Feet Assistive device: Rolling walker (2 wheeled) Gait Pattern/deviations: Step-through pattern;Decreased stride length;Wide base of support Gait velocity: decr    General Gait Details: Min guard for safety. Pt with reaching for environment for steadying during ambulation, requesting we ambulate near rails in case he needs steadying assist. Pt with one-2 minute standing rest break after 150 ft ambulation. HR 123-140 bpm during ambulation, SPO2 89-94%. Pt's sats went to 89% very temporarily, increased with rest.   Stairs            Wheelchair Mobility    Modified Rankin (Stroke Patients Only)       Balance Overall balance assessment: Needs assistance Sitting-balance support: No upper extremity supported;Feet unsupported Sitting balance-Leahy Scale: Good     Standing balance support: No upper extremity supported Standing balance-Leahy Scale: Fair Standing balance comment: cannot tolerate challenges, occasionally reaches for environment                              Pertinent Vitals/Pain Pain Assessment: 0-10 Pain Score: 4  Pain Location: abdomen Pain Descriptors / Indicators: Tender;Sore Pain Intervention(s): Limited activity within patient's tolerance;Monitored during session;Premedicated before session    [place] expects to be discharged to:: Private residence Living Arrangements: Spouse/significant other;Children(2 children) Available Help at Discharge: Family;Available PRN/intermittently Type of Home: House Home Access: Stairs to enter Entrance Stairs-Rails: Left Entrance Stairs-Number of Steps: 6 Home Layout: Two level Home Equipment: None      Prior Function Level of Independence: Independent  Hand Dominance   Dominant Hand: Right    Extremity/Trunk Assessment   Upper Extremity Assessment Upper Extremity Assessment: Defer to OT evaluation    Lower Extremity Assessment Lower  Extremity Assessment: Generalized weakness    Cervical / Trunk Assessment Cervical / Trunk Assessment: Normal  Communication   Communication: No difficulties  Cognition Arousal/Alertness: Awake/alert Behavior During Therapy: WFL for tasks assessed/performed Overall Cognitive Status: Impaired/Different from baseline Area of Impairment: Problem solving                             Problem Solving: Slow processing;Difficulty sequencing General Comments: delayed response time, at times wife answers questions for pt       General Comments General comments (skin integrity, edema, etc.): LE pitting edema bilaterally    Exercises General Exercises - Lower Extremity Ankle Circles/Pumps: AROM;20 reps;Seated Long Arc Quad: AROM;Both;10 reps;Seated Heel Slides: AROM;Both;5 reps;Seated   Assessment/Plan    PT Assessment Patient needs continued PT services  PT Problem List Decreased strength;Pain;Decreased activity tolerance;Decreased knowledge of use of DME;Decreased balance;Decreased safety awareness;Decreased mobility       PT Treatment Interventions DME instruction;Therapeutic activities;Gait training;Therapeutic exercise;Patient/family education;Stair training;Balance training;Functional mobility training    PT Goals (Current goals can be found in the Care Plan section)  Acute Rehab PT Goals Patient Stated Goal: ambulate further  PT Goal Formulation: With patient/family Time For Goal Achievement: 08/17/18 Potential to Achieve Goals: Good    Frequency Min 3X/week   Barriers to discharge        Co-evaluation               AM-PAC PT "6 Clicks" Daily Activity  Outcome Measure Difficulty turning over in bed (including adjusting bedclothes, sheets and blankets)?: Unable Difficulty moving from lying on back to sitting on the side of the bed? : Unable Difficulty sitting down on and standing up from a chair with arms (e.g., wheelchair, bedside commode, etc,.)?:  Unable Help needed moving to and from a bed to chair (including a wheelchair)?: None Help needed walking in hospital room?: A Little Help needed climbing 3-5 steps with a railing? : A Little 6 Click Score: 13    End of Session Equipment Utilized During Treatment: Gait belt Activity Tolerance: Patient tolerated treatment well;Patient limited by fatigue Patient left: in chair;with call bell/phone within reach;with family/visitor present Nurse Communication: Mobility status PT Visit Diagnosis: Muscle weakness (generalized) (M62.81);Other abnormalities of gait and mobility (R26.89)    Time: 1123-1200 PT Time Calculation (min) (ACUTE ONLY): 37 min   Charges:   PT Evaluation $PT Eval Low Complexity: 1 Low PT Treatments $Gait Training: 8-22 mins        Julien Girt, PT Acute Rehabilitation Services Pager [phone]  Office [phone]  Andrell Tallman D Fitzhugh Vizcarrondo 08/03/2018, 2:00 PM

## 2018-08-03 NOTE — Progress Notes (Signed)
PROGRESS NOTE    Jeffery Roman  DJM:[id number] DOB: 19-Nov-1965 DOA: 07/31/2018 PCP: Alroy Dust, L.Marlou Sa, MD      Brief Narrative:  Jeffery Roman is a 52 y.o. M with HTN and newly diagnosed metastatic colon cancer who presented to [place] clinic for port placement, found to have fever, tachycardia, hyperCa and elevated bilirubin.   Assessment & Plan:  Rule out sepsis Possible pneumonia Presented with fever 102F, WBC 22K, lactic acid >2.  UA unremarkable, CXR clear.  CT abdomen ?pneumonia, although no respiratory symptoms.  Started on Zosyn.  No fever since admission.   Procal up.  GI consulted, does not suspect cholangitis, no indication for decompression. -Levaquin one more day  Hypercalcemia Ca improved.  Metastatic colon cancer Follows with Dr. Burr Medico.  Diagnosed with metastatic cancer in Oct 2019 due to obstructive pancreatitis, found incidentally.  Colon biopsy showed adenoCA, liver lesion biopsy same. -Continue IV Dilaudid q4 prn  -Continue oxycodone 5 mg q4 prn  AKI Baseline 1.0, creatinine normalized overnight  Hypertension BP normal to high -Continue amlodipine  Anemia of neoplastic disease Hgb stbale.  Severe protein calorie malnutrition  -Continue supplements  Leg swelling Due to IV fluids and low albumin.  Monitor.  Diuretics as needed.      MDM and disposition: The below labs and imaging reports were reviewed and summarized above.  Medication management as above.  The patient has terminal cancer, presented with dehydration, possible pneumonia sepsis.  He was started on antibiotics and is improved with fluid and antibiotics.  Will treat with 1 more day antibiotics.  He had a PICC line placed, and will stay inpatient for initiation of chemotherapy given his debilitated funcational state.         DVT prophylaxis: Lovenox Code Status: FULL Family Communication: Wife at bedside    Consultants:   Oncology  GI  Palliative  Procedures:   RUQ [place]  11/5 IMPRESSION: Widespread and advanced hepatic metastatic disease as known by previous imaging studies. The gallbladder contains sludge but no shadowing stones. There is no Murphy sign. Borderline gallbladder wall thickness but no surrounding fluid. The overall balance of the information argues against cholecystitis.   Electronically Signed   By: Nelson Chimes M.D.   On: 07/31/2018 20:43  Antimicrobials:   Zosyn 11/5 >> 11/7  Levaquin 11/7 >> 11/9   Subjective: No fever overnight.  Abdominal pain well controlled with oxycodone.  No bowel movements.  No cough, sputum, dyspnea.  No confusion.  No vomiting.       Objective: Vitals:   08/03/18 1222 08/03/18 1301 08/03/18 1508 08/03/18 1559  BP: (!) 144/82 (!) 147/81 (!) 148/96 (!) 150/91  Pulse: (!) 115 (!) 112 (!) 116 (!) 111  Resp: 16 20    Temp: 98.9 F (37.2 C) 98.8 F (37.1 C) 98.3 F (36.8 C)   TempSrc: Oral Oral Oral   SpO2: 96% 96% 99%   Weight:      Height:        Intake/Output Summary (Last 24 hours) at 08/03/2018 1832 Last data filed at 08/03/2018 1812 Gross per 24 hour  Intake 1776.98 ml  Output -  Net 1776.98 ml   Filed Weights   07/31/18 2325  Weight: 85.2 kg    Examination: General appearance: Thin adult male, sitting in recliner, no acute distress, appears tired. HEENT: Icterus, lids and lashes normal, lips normal, dentition normal, hearing normal. Skin: Warm and dry, moderate jaundice, no suspicious rashes or lesions.   Cardiac: Tachycardic, regular, no  murmurs, 2+ lower extremity edema Respiratory: Respiratory effort normal, lungs clear without rales or wheezes Abdomen: Abdomen with voluntary guarding, no rebound.  Tenderness diffusely. MSK: No deformities or effusions of the large joints, moderate loss of subcutaneous muscle mass and fat. Neuro: Awake and alert, extraocular movements intact, shuffling gait, moves all extremities with diminished but symmetric strength, speech  fluent. Psych: Sensorium intact responding to questions, oriented to person, place, and time.  Attention normal, affect blunted.    Data Reviewed: I have personally reviewed following labs and imaging studies:  CBC: Recent Labs  Lab 07/31/18 1007 08/01/18 0323 08/02/18 0414 08/03/18 0612  WBC 22.0* 22.2* 17.7* 15.9*  HGB 8.8* 8.5* 7.2* 7.4*  HCT 27.3* 26.8* 22.7* 22.9*  MCV 83.7 84.0 85.0 84.5  PLT 432* 441* 368 650   Basic Metabolic Panel: Recent Labs  Lab 07/31/18 1431 07/31/18 1817 08/01/18 0323 08/02/18 0414 08/03/18 0612  NA 136 138 136 138 139  K 5.3* 5.0 5.0 4.3 3.9  CL 103 105 102 107 110  CO2 25 24 25 23 22   GLUCOSE 89 93 75 88 92  BUN 31* 32* 30* 32* 28*  CREATININE 1.37* 1.21 1.28* 1.16 1.08  CALCIUM 12.4* 12.1* 12.3* 10.9* 10.2   GFR: Estimated Creatinine Clearance: 88.1 mL/min (by C-G formula based on SCr of 1.08 mg/dL). Liver Function Tests: Recent Labs  Lab 07/31/18 1431 08/01/18 0323 08/02/18 0414 08/03/18 0612  AST 118* 125* 133* 140*  ALT 50* 53* 53* 63*  ALKPHOS 248* 205* 165* 157*  BILITOT 9.2* 11.8* 8.7* 8.2*  PROT 6.5 7.2 5.9* 5.9*  ALBUMIN 1.9* 2.4* 1.7* 1.7*   Recent Labs  Lab 07/31/18 1818  LIPASE 52*   Recent Labs  Lab 07/31/18 1444  AMMONIA 24   Coagulation Profile: No results for input(s): INR, PROTIME in the last 168 hours. Cardiac Enzymes: No results for input(s): CKTOTAL, CKMB, CKMBINDEX, TROPONINI in the last 168 hours. BNP (last 3 results) No results for input(s): PROBNP in the last 8760 hours. HbA1C: No results for input(s): HGBA1C in the last 72 hours. CBG: No results for input(s): GLUCAP in the last 168 hours. Lipid Profile: No results for input(s): CHOL, HDL, LDLCALC, TRIG, CHOLHDL, LDLDIRECT in the last 72 hours. Thyroid Function Tests: No results for input(s): TSH, T4TOTAL, FREET4, T3FREE, THYROIDAB in the last 72 hours. Anemia Panel: No results for input(s): VITAMINB12, FOLATE, FERRITIN, TIBC, IRON,  RETICCTPCT in the last 72 hours. Urine analysis:    Component Value Date/Time   COLORURINE AMBER (A) 07/31/2018 1750   APPEARANCEUR HAZY (A) 07/31/2018 1750   LABSPEC 1.020 07/31/2018 1750   PHURINE 5.0 07/31/2018 1750   GLUCOSEU NEGATIVE 07/31/2018 1750   HGBUR SMALL (A) 07/31/2018 1750   BILIRUBINUR SMALL (A) 07/31/2018 1750   KETONESUR NEGATIVE 07/31/2018 1750   PROTEINUR NEGATIVE 07/31/2018 1750   NITRITE NEGATIVE 07/31/2018 1750   LEUKOCYTESUR NEGATIVE 07/31/2018 1750   Sepsis Labs: @LABRCNTIP (procalcitonin:4,lacticacidven:4)  ) Recent Results (from the past 240 hour(s))  Culture, Blood     Status: None (Preliminary result)   Collection Time: 07/31/18 11:34 AM  Result Value Ref Range Status   Specimen Description   Final    BLOOD LEFT ARM Performed at [facility], [place] [address], [place], [place] [address]    Special Requests   Final    NONE Performed at [facility], [place] [address] [place], [place] [address]    Culture   Final    NO  GROWTH 3 DAYS Performed at [facility], [place] [address], [place], [place] [address]    Report Status PENDING  Incomplete  Culture, Blood     Status: None (Preliminary result)   Collection Time: 07/31/18 11:38 AM  Result Value Ref Range Status   Specimen Description   Final    BLOOD RIGHT ARM Performed at [facility], [place] [address] [place], [place] [address]    Special Requests   Final    NONE Performed at [facility], [place] [address] [place], [place] [address]    Culture   Final    NO GROWTH 3 DAYS Performed at [facility], [place] [address] [place], [place] [address]    Report Status PENDING  Incomplete  Urine culture     Status: None   Collection Time: 07/31/18  5:50 PM  Result Value Ref Range Status   Specimen Description   Final    URINE, CLEAN CATCH Performed at [facility], [place]  [address] [place], [place] [address]    Special Requests   Final    NONE Performed at [facility], [place] [address], [place], [place] [address]    Culture   Final    NO GROWTH Performed at [facility], [place] [address], [place], [place] [address]    Report Status 08/02/2018 FINAL  Final  MRSA PCR Screening     Status: None   Collection Time: 07/31/18 11:28 PM  Result Value Ref Range Status   MRSA by PCR NEGATIVE NEGATIVE Final    Comment:        The GeneXpert MRSA Assay (FDA approved for NASAL specimens only), is one component of a comprehensive MRSA colonization surveillance program. It is not intended to diagnose MRSA infection nor to guide or monitor treatment for MRSA infections. Performed at [facility], [place] [address] [place], [place] [address]          Radiology Studies: [place] Ekg Site Rite  Result Date: 08/02/2018 If [facility] image not attached, placement could not be confirmed due to current cardiac rhythm.       Scheduled Meds: . amLODipine  10 mg Oral Daily  . chlorhexidine  15 mL Mouth Rinse BID  . enoxaparin (LOVENOX) injection  40 mg Subcutaneous Q24H  . feeding supplement (ENSURE ENLIVE)  237 mL Oral Daily  . FLUOROURACIL (ADRUCIL) CHEMO infusion For Inpatient Use  1,500 mg/m2 (Treatment Plan Recorded) Intravenous Once  . levofloxacin  750 mg Oral Daily  . mouth rinse  15 mL Mouth Rinse q12n4p  . multivitamin with minerals  1 tablet Oral Daily  . protein supplement shake  11 oz Oral BID BM   Continuous Infusions:    LOS: 3 days    Time spent: 25 minutes    Edwin Dada, MD [name] 08/03/2018, 6:32 PM     Pager [phone] --- please page though AMION:  [email or web address] If 7PM-7AM, please contact night-coverage

## 2018-08-04 ENCOUNTER — Inpatient Hospital Stay: Payer: BLUE CROSS/BLUE SHIELD

## 2018-08-04 DIAGNOSIS — C799 Secondary malignant neoplasm of unspecified site: Secondary | ICD-10-CM

## 2018-08-04 DIAGNOSIS — C189 Malignant neoplasm of colon, unspecified: Secondary | ICD-10-CM

## 2018-08-04 LAB — VITAMIN B12: Vitamin B-12: 1827 pg/mL — ABNORMAL HIGH (ref 180–914)

## 2018-08-04 LAB — TYPE AND SCREEN
ABO/RH(D): A POS
Antibody Screen: NEGATIVE
Unit division: 0

## 2018-08-04 LAB — BPAM RBC
Blood Product Expiration Date: 201912042359
ISSUE DATE / TIME: 201911081231
Unit Type and Rh: 6200

## 2018-08-04 LAB — CBC
HCT: 24.7 % — ABNORMAL LOW (ref 39.0–52.0)
Hemoglobin: 8 g/dL — ABNORMAL LOW (ref 13.0–17.0)
MCH: 27.3 pg (ref 26.0–34.0)
MCHC: 32.4 g/dL (ref 30.0–36.0)
MCV: 84.3 fL (ref 80.0–100.0)
NRBC: 0.6 % — AB (ref 0.0–0.2)
PLATELETS: 408 10*3/uL — AB (ref 150–400)
RBC: 2.93 MIL/uL — AB (ref 4.22–5.81)
RDW: 15.7 % — ABNORMAL HIGH (ref 11.5–15.5)
WBC: 14 10*3/uL — ABNORMAL HIGH (ref 4.0–10.5)

## 2018-08-04 LAB — RETICULOCYTES
Immature Retic Fract: 29.9 % — ABNORMAL HIGH (ref 2.3–15.9)
RBC.: 2.93 MIL/uL — ABNORMAL LOW (ref 4.22–5.81)
RETIC COUNT ABSOLUTE: 64.5 10*3/uL (ref 19.0–186.0)
Retic Ct Pct: 2.2 % (ref 0.4–3.1)

## 2018-08-04 LAB — LACTATE DEHYDROGENASE: LDH: 745 U/L — AB (ref 98–192)

## 2018-08-04 LAB — COMPREHENSIVE METABOLIC PANEL
ALK PHOS: 163 U/L — AB (ref 38–126)
ALT: 62 U/L — ABNORMAL HIGH (ref 0–44)
ANION GAP: 9 (ref 5–15)
AST: 121 U/L — ABNORMAL HIGH (ref 15–41)
Albumin: 1.7 g/dL — ABNORMAL LOW (ref 3.5–5.0)
BILIRUBIN TOTAL: 8.9 mg/dL — AB (ref 0.3–1.2)
BUN: 26 mg/dL — ABNORMAL HIGH (ref 6–20)
CALCIUM: 9.6 mg/dL (ref 8.9–10.3)
CO2: 19 mmol/L — ABNORMAL LOW (ref 22–32)
Chloride: 106 mmol/L (ref 98–111)
Creatinine, Ser: 0.88 mg/dL (ref 0.61–1.24)
Glucose, Bld: 121 mg/dL — ABNORMAL HIGH (ref 70–99)
Potassium: 4.2 mmol/L (ref 3.5–5.1)
Sodium: 134 mmol/L — ABNORMAL LOW (ref 135–145)
TOTAL PROTEIN: 6.1 g/dL — AB (ref 6.5–8.1)

## 2018-08-04 LAB — DIC (DISSEMINATED INTRAVASCULAR COAGULATION)PANEL
Fibrinogen: 609 mg/dL — ABNORMAL HIGH (ref 210–475)
Prothrombin Time: 18.2 seconds — ABNORMAL HIGH (ref 11.4–15.2)
Smear Review: NONE SEEN
aPTT: 37 seconds — ABNORMAL HIGH (ref 24–36)

## 2018-08-04 LAB — DIC (DISSEMINATED INTRAVASCULAR COAGULATION) PANEL
D DIMER QUANT: 3.66 ug{FEU}/mL — AB (ref 0.00–0.50)
INR: 1.53
PLATELETS: 379 10*3/uL (ref 150–400)

## 2018-08-04 LAB — URIC ACID: Uric Acid, Serum: 3.7 mg/dL (ref 3.7–8.6)

## 2018-08-04 MED ORDER — SODIUM CHLORIDE 0.9 % IV SOLN
510.0000 mg | Freq: Once | INTRAVENOUS | Status: AC
  Administered 2018-08-04: 510 mg via INTRAVENOUS
  Filled 2018-08-04: qty 17

## 2018-08-04 MED ORDER — HYDROCORTISONE 1 % EX CREA
TOPICAL_CREAM | Freq: Two times a day (BID) | CUTANEOUS | Status: DC
  Administered 2018-08-04 – 2018-08-06 (×5): via TOPICAL
  Filled 2018-08-04: qty 28

## 2018-08-04 NOTE — Progress Notes (Signed)
Palliative care progress note  Reason for consult: Pain management   I checked in on Jeffery Roman this morning.  He was alone in the room.  We discussed pain management.  He reports doing "good" overnight.  Chart review reveals that he has only used 1 dose of oral medication.  Last BM was yesterday.  Reports taking Miralax with good effect.  Has some nausea, but now feeling better and wanting to order breakfast.  His only concern today is hemorrhoids being inflamed.  Requested preparation H.  Plan to continue same pain regimen.  Monitor bowel regimen.  Will check on him early next week.  He has my card and will call if any needs this weekend.  Total time: 15 minutes  Greater than 50%  of this time was spent counseling and coordinating care related to the above assessment and plan.  Micheline Rough, MD [place] Team [phone]

## 2018-08-04 NOTE — Progress Notes (Signed)
IP PROGRESS NOTE  Subjective:   Jeffery Roman has been diagnosed with metastatic colon cancer.  He began cycle 1 FOLFOX 08/03/2018 under the direction of Dr. Burr Medico.  He reports nausea relieved with antiemetics.  No emesis.  No other complaint.  Objective: Vital signs in last 24 hours: Blood pressure 135/83, pulse 100, temperature 98.1 F (36.7 C), temperature source Oral, resp. rate 14, height 5\' 11"  (1.803 m), weight 187 lb 13.3 oz (85.2 kg), SpO2 99 %.  Intake/Output from previous day: 11/08 0701 - 11/09 0700 In: 1825.3 [P.O.:240; I.V.:5.2; Blood:391; IV Piggyback:1189.1] Out: -   Physical Exam:  HEENT: Scleral icterus, no thrush Lungs: Clear bilaterally with decreased breath sounds at the right lower chest, no respiratory distress Cardiac: Regular rate and rhythm Abdomen: The liver is palpable throughout the upper abdomen Extremities: 1+ edema at the low leg bilaterally   Portacath/PICC-without erythema  Lab Results: Recent Labs    08/03/18 0612 08/04/18 0347  WBC 15.9* 14.0*  HGB 7.4* 8.0*  HCT 22.9* 24.7*  PLT 367 408*    BMET Recent Labs    08/03/18 0612 08/04/18 0347  NA 139 134*  K 3.9 4.2  CL 110 106  CO2 22 19*  GLUCOSE 92 121*  BUN 28* 26*  CREATININE 1.08 0.88  CALCIUM 10.2 9.6    Lab Results  Component Value Date   CEA1 33.7 (H) 07/20/2018    Studies/Results: [place] Ekg Site Rite  Result Date: 08/02/2018 If Site [place] not attached, placement could not be confirmed due to current cardiac rhythm.   Medications: I have reviewed the patient's current medications.  Assessment/Plan:  1.  Metastatic colon cancer-cycle 1 FOLFOX 08/03/2018 2.  Anemia status post 1 unit of packed red blood cells on 08/03/2018 3.  Hypercalcemia, status post pamidronate 08/01/2018-improved  Mr. Hemann appears to be tolerating the FOLFOX well.  He will complete cycle 1 on 08/05/2018.  He has persistent severe anemia secondary to chronic disease, phlebotomy, and  potentially bleeding from the colon tumor.  Please call oncology as needed on 08/05/2018.  Dr. Burr Medico will return on 08/06/2018.     LOS: 4 days   Betsy Coder, MD   08/04/2018, 7:57 AM

## 2018-08-04 NOTE — Progress Notes (Signed)
PROGRESS NOTE    Jeffery Roman  KZS:[id number] DOB: Mar 14, 1966 DOA: 07/31/2018 PCP: Alroy Dust, L.Marlou Sa, MD      Brief Narrative:  Jeffery Roman is a 52 y.o. M with HTN and newly diagnosed metastatic colon cancer who presented to [facility] for port placement, found to have fever, tachycardia, hyperCa and elevated bilirubin.   Assessment & Plan:  Hypercalcemia Calcium normalized with pamidronate.  Metastatic colon cancer Follows with Dr. Burr Medico.  Diagnosed with metastatic cancer in Oct 2019 due to obstructive pancreatitis, found incidentally.  Colon biopsy showed adenoCA, liver lesion biopsy same. -Continue oxycodone 5 mg q4 prn, using 1-2 doses per day -WIll finish first cycle FOLFOX tomorrow  Rule out sepsis Possible pneumonia Presented with fever 102F, WBC 22K, lactic acid >2.  UA unremarkable, CXR clear.  CT abdomen ?pneumonia, although no respiratory symptoms.  Started on Zosyn, completed 5 days antibiotics with Levaquin. No further fever.     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  Iron studies show deficiency.  No hemolysis.   -Feraheme ordered -Iron supplement at discharge  Severe protein calorie malnutrition  -Continue supplements  Leg swelling Due to IV fluids and low albumin.  Monitor.  Diuretics as needed.      MDM and disposition: Below labs and imaging reports were reviewed and summarized above.  Medication management as above.  The patient has terminal cancer, now presented with dehydration, possible pneumonia sepsis.  Started on antibiotics and improved.  Now started with chemotherapy after PICC line was placed.  We will continue inpatient until his chemotherapy is completed, then discussed with oncology about discharge.       DVT prophylaxis: Lovenox Code Status: FULL Family  Communication: None present    Consultants:   Oncology  GI  Palliative  Procedures:   RUQ [place] 11/5 IMPRESSION: Widespread and advanced hepatic metastatic disease as known by previous imaging studies. The gallbladder contains sludge but no shadowing stones. There is no Murphy sign. Borderline gallbladder wall thickness but no surrounding fluid. The overall balance of the information argues against cholecystitis.   Electronically Signed   By: Nelson Chimes M.D.   On: 07/31/2018 20:43  Antimicrobials:   Zosyn 11/5 >> 11/7  Levaquin 11/7 >> 11/9   Subjective: No new fever.  Abdominal pain improved.  No cough, sputum, dyspnea, confusion, vomiting, diarrhea.      Objective: Vitals:   08/03/18 1559 08/03/18 2003 08/04/18 0400 08/04/18 1450  BP: (!) 150/91 (!) 153/90 135/83 133/83  Pulse: (!) 111 (!) 108 100 (!) 102  Resp:  18 14 17   Temp:  98.3 F (36.8 C) 98.1 F (36.7 C) 98.8 F (37.1 C)  TempSrc:  Oral Oral Oral  SpO2:  100% 99% 96%  Weight:      Height:        Intake/Output Summary (Last 24 hours) at 08/04/2018 1532 Last data filed at 08/04/2018 0407 Gross per 24 hour  Intake 1194.3 ml  Output -  Net 1194.3 ml   Filed Weights   07/31/18 2325  Weight: 85.2 kg    Examination: General appearance: Thin adult male, no acute distress, sitting in recliner.  Appears tired. HEENT: Icteric, lids and lashes normal, lips dry, dentition normal, urine normal. Skin: Warm and dry without suspicious rashes or lesions.  Moderate jaundice. Cardiac: RRR, no murmurs, 2+ lower extremity edema. Respiratory: Respiratory effort normal without  rales or wheezes. Abdomen: Abdomen with voluntary guarding especially in the right upper quadrant, no rebound, no rigidity. MSK: No deformities or effusions of the large joints of the upper lower extremities bilaterally, moderate loss of subcutaneous muscle mass and fat. Neuro: Awake and alert, extraocular movements intact, moves all  extremities with global weakness, symmetric strength, speech fluent. Psych: Sensorium intact responding to questions, oriented to person, place, and time.  Attention normal, affect blunted.     Data Reviewed: I have personally reviewed following labs and imaging studies:  CBC: Recent Labs  Lab 07/31/18 1007 08/01/18 0323 08/02/18 0414 08/03/18 0612 08/04/18 0347 08/04/18 1118  WBC 22.0* 22.2* 17.7* 15.9* 14.0*  --   HGB 8.8* 8.5* 7.2* 7.4* 8.0*  --   HCT 27.3* 26.8* 22.7* 22.9* 24.7*  --   MCV 83.7 84.0 85.0 84.5 84.3  --   PLT 432* 441* 368 367 408* 166   Basic Metabolic Panel: Recent Labs  Lab 07/31/18 1817 08/01/18 0323 08/02/18 0414 08/03/18 0612 08/04/18 0347  NA 138 136 138 139 134*  K 5.0 5.0 4.3 3.9 4.2  CL 105 102 107 110 106  CO2 24 25 23 22  19*  GLUCOSE 93 75 88 92 121*  BUN 32* 30* 32* 28* 26*  CREATININE 1.21 1.28* 1.16 1.08 0.88  CALCIUM 12.1* 12.3* 10.9* 10.2 9.6   GFR: Estimated Creatinine Clearance: 108.1 mL/min (by C-G formula based on SCr of 0.88 mg/dL). Liver Function Tests: Recent Labs  Lab 07/31/18 1431 08/01/18 0323 08/02/18 0414 08/03/18 0612 08/04/18 0347  AST 118* 125* 133* 140* 121*  ALT 50* 53* 53* 63* 62*  ALKPHOS 248* 205* 165* 157* 163*  BILITOT 9.2* 11.8* 8.7* 8.2* 8.9*  PROT 6.5 7.2 5.9* 5.9* 6.1*  ALBUMIN 1.9* 2.4* 1.7* 1.7* 1.7*   Recent Labs  Lab 07/31/18 1818  LIPASE 52*   Recent Labs  Lab 07/31/18 1444  AMMONIA 24   Coagulation Profile: Recent Labs  Lab 08/04/18 1118  INR 1.53   Cardiac Enzymes: No results for input(s): CKTOTAL, CKMB, CKMBINDEX, TROPONINI in the last 168 hours. BNP (last 3 results) No results for input(s): PROBNP in the last 8760 hours. HbA1C: No results for input(s): HGBA1C in the last 72 hours. CBG: No results for input(s): GLUCAP in the last 168 hours. Lipid Profile: No results for input(s): CHOL, HDL, LDLCALC, TRIG, CHOLHDL, LDLDIRECT in the last 72 hours. Thyroid Function  Tests: No results for input(s): TSH, T4TOTAL, FREET4, T3FREE, THYROIDAB in the last 72 hours. Anemia Panel: Recent Labs    08/04/18 0347  RETICCTPCT 2.2   Urine analysis:    Component Value Date/Time   COLORURINE AMBER (A) 07/31/2018 1750   APPEARANCEUR HAZY (A) 07/31/2018 1750   LABSPEC 1.020 07/31/2018 1750   PHURINE 5.0 07/31/2018 1750   GLUCOSEU NEGATIVE 07/31/2018 1750   HGBUR SMALL (A) 07/31/2018 1750   BILIRUBINUR SMALL (A) 07/31/2018 1750   KETONESUR NEGATIVE 07/31/2018 1750   PROTEINUR NEGATIVE 07/31/2018 1750   NITRITE NEGATIVE 07/31/2018 1750   LEUKOCYTESUR NEGATIVE 07/31/2018 1750   Sepsis Labs: @LABRCNTIP (procalcitonin:4,lacticacidven:4)  ) Recent Results (from the past 240 hour(s))  Culture, Blood     Status: None (Preliminary result)   Collection Time: 07/31/18 11:34 AM  Result Value Ref Range Status   Specimen Description   Final    BLOOD LEFT ARM Performed at [facility], [place] [address] [place], [place] [address]    Special Requests   Final    NONE  Performed at [facility], [place] [address] [place], [place] [address]    Culture   Final    NO GROWTH 3 DAYS Performed at [facility], [place] [address], [place], [place] [address]    Report Status PENDING  Incomplete  Culture, Blood     Status: None (Preliminary result)   Collection Time: 07/31/18 11:38 AM  Result Value Ref Range Status   Specimen Description   Final    BLOOD RIGHT ARM Performed at [facility], [place] [address], [place], [place] [address]    Special Requests   Final    NONE Performed at [facility], [place] [address], [place], [place] [address]    Culture   Final    NO GROWTH 3 DAYS Performed at [facility], [place] [address] [place], [place] [address]    Report Status PENDING  Incomplete  Urine culture     Status: None   Collection Time: 07/31/18  5:50 PM  Result Value  Ref Range Status   Specimen Description   Final    URINE, CLEAN CATCH Performed at [facility], [place] [address], [place], [place] [address]    Special Requests   Final    NONE Performed at [facility], [place] [address], [place], [place] [address]    Culture   Final    NO GROWTH Performed at [facility], [place] [address] [place], [place] [address]    Report Status 08/02/2018 FINAL  Final  MRSA PCR Screening     Status: None   Collection Time: 07/31/18 11:28 PM  Result Value Ref Range Status   MRSA by PCR NEGATIVE NEGATIVE Final    Comment:        The GeneXpert MRSA Assay (FDA approved for NASAL specimens only), is one component of a comprehensive MRSA colonization surveillance program. It is not intended to diagnose MRSA infection nor to guide or monitor treatment for MRSA infections. Performed at [facility], [place] [address] [place],  [address]          Radiology Studies: No results found.      Scheduled Meds: . amLODipine  10 mg Oral Daily  . chlorhexidine  15 mL Mouth Rinse BID  . enoxaparin (LOVENOX) injection  40 mg Subcutaneous Q24H  . feeding supplement (ENSURE ENLIVE)  237 mL Oral Daily  . FLUOROURACIL (ADRUCIL) CHEMO infusion For Inpatient Use  1,500 mg/m2 (Treatment Plan Recorded) Intravenous Once  . hydrocortisone cream   Topical BID  . mouth rinse  15 mL Mouth Rinse q12n4p  . protein supplement shake  11 oz Oral BID BM   Continuous Infusions:    LOS: 4 days    Time spent: 25 minutes    Edwin Dada, MD [name] 08/04/2018, 3:32 PM     Pager [phone] --- please page though AMION:  [email or web address] Password [email or web address] If 7PM-7AM, please contact night-coverage

## 2018-08-04 NOTE — Progress Notes (Addendum)
Palliative care progress note  Reason for consult: Pain management   I checked in on Jeffery Roman and his wife this morning.  Plan today for chemotherapy.  We discussed pain management.  He reports doing "pretty good" overnight.  Chart review reveals that he has only used 1 dose of oral and one dose of IV rescue medication.  Last BM was 2 days ago.  Reports taking Miralax today.  Plan to continue same pain regimen.  Monitor bowel regimen.  Will check in on him one day this weekend.  He has my card and will call if any needs.  Total time: 15 minutes  Greater than 50%  of this time was spent counseling and coordinating care related to the above assessment and plan.  Micheline Rough, MD [facility] [phone]

## 2018-08-05 LAB — CBC
HCT: 24.9 % — ABNORMAL LOW (ref 39.0–52.0)
Hemoglobin: 8.1 g/dL — ABNORMAL LOW (ref 13.0–17.0)
MCH: 26.9 pg (ref 26.0–34.0)
MCHC: 32.5 g/dL (ref 30.0–36.0)
MCV: 82.7 fL (ref 80.0–100.0)
Platelets: 408 K/uL — ABNORMAL HIGH (ref 150–400)
RBC: 3.01 MIL/uL — ABNORMAL LOW (ref 4.22–5.81)
RDW: 15.7 % — ABNORMAL HIGH (ref 11.5–15.5)
WBC: 16.4 K/uL — ABNORMAL HIGH (ref 4.0–10.5)
nRBC: 0.5 % — ABNORMAL HIGH (ref 0.0–0.2)

## 2018-08-05 LAB — COMPREHENSIVE METABOLIC PANEL WITH GFR
ALT: 69 U/L — ABNORMAL HIGH (ref 0–44)
AST: 119 U/L — ABNORMAL HIGH (ref 15–41)
Albumin: 1.7 g/dL — ABNORMAL LOW (ref 3.5–5.0)
Alkaline Phosphatase: 164 U/L — ABNORMAL HIGH (ref 38–126)
Anion gap: 9 (ref 5–15)
BUN: 34 mg/dL — ABNORMAL HIGH (ref 6–20)
CO2: 21 mmol/L — ABNORMAL LOW (ref 22–32)
Calcium: 9.6 mg/dL (ref 8.9–10.3)
Chloride: 107 mmol/L (ref 98–111)
Creatinine, Ser: 0.91 mg/dL (ref 0.61–1.24)
GFR calc Af Amer: >60 mL/min
GFR calc non Af Amer: >60 mL/min
Glucose, Bld: 118 mg/dL — ABNORMAL HIGH (ref 70–99)
Potassium: 4.2 mmol/L (ref 3.5–5.1)
Sodium: 137 mmol/L (ref 135–145)
Total Bilirubin: 9.5 mg/dL — ABNORMAL HIGH (ref 0.3–1.2)
Total Protein: 6.2 g/dL — ABNORMAL LOW (ref 6.5–8.1)

## 2018-08-05 LAB — CULTURE, BLOOD (SINGLE)
Culture: NO GROWTH
Culture: NO GROWTH

## 2018-08-05 LAB — HAPTOGLOBIN: Haptoglobin: 312 mg/dL — ABNORMAL HIGH (ref 34–200)

## 2018-08-05 NOTE — Progress Notes (Signed)
PROGRESS NOTE    Jeffery Roman  LHT:[id number] DOB: 07-Apr-1966 DOA: 07/31/2018 PCP: Jeffery Roman      Brief Narrative:  Jeffery Roman is a 52 y.o. M with HTN and newly diagnosed metastatic colon cancer who presented to [facility] for port placement, found to have fever, tachycardia, hyperCa and elevated bilirubin.      Assessment & Plan:   Metastatic colon cancer Follows with Jeffery Roman.  Diagnosed with metastatic cancer in Oct 2019 due to obstructive pancreatitis, found incidentally.  Colon biopsy showed adenoCA, liver lesion biopsy same.  Initiated FOLFOX, no concerns. -Continue oxycodone 5 mg q4 prn, using 1-2 doses per day -WIll finish first cycle FOLFOX today  Rule out sepsis Possible pneumonia Presented with fever 102F, WBC 22K, lactic acid >2.  UA unremarkable, CXR clear.  CT abdomen ?pneumonia, although no respiratory symptoms.  Started on Zosyn, completed 5 days antibiotics with Levaquin. No further fever.     Hypercalcemia Calcium normalized with pamidronate.  AKI Baseline 1.0, creatinine normalized   Hypertension BP improved -Continue amlodipine  Anemia of neoplastic disease and superimposed iron deficiency Hgb stable.  Iron studies show deficiency.  No hemolysis.   Given feraheme. -Iron supplement at discharge  Severe protein calorie malnutrition  -Continue supplements  Leg swelling Due to IV fluids and low albumin.  Monitor.  Diuretics as needed.      MDM and disposition: The below labs and imaging reports were reviewed and summarized above.  Medication management as above.  The patient is terminal metastatic colon cancer, now presented with dehydration, possible pneumonia sepsis.  He was treated with antibiotics and his fevers have not returned.  During his hospitalization because of his debility, he was started on chemotherapy as an inpatient.  He will complete this first cycle today, will discuss with oncology tomorrow discharge planning.          DVT prophylaxis: Lovenox Code Status: FULL Family Communication: None present    Consultants:   Oncology  GI  Palliative  Procedures:   RUQ [place] 11/5 IMPRESSION: Widespread and advanced hepatic metastatic disease as known by previous imaging studies. The gallbladder contains sludge but no shadowing stones. There is no Murphy sign. Borderline gallbladder wall thickness but no surrounding fluid. The overall balance of the information argues against cholecystitis.   Electronically Signed   By: Jeffery Roman M.D.   On: 07/31/2018 20:43  Antimicrobials:   Zosyn 11/5 >> 11/7  Levaquin 11/7 >> 11/9   Subjective: No new fever, abdominal pain stable, no cough, sputum, dyspnea, confusion, vomiting, diarrhea.    Objective: Vitals:   08/04/18 1450 08/04/18 2117 08/05/18 0607 08/05/18 1428  BP: 133/83 (!) 141/82 (!) 147/84 (!) 144/85  Pulse: (!) 102 (!) 107 (!) 107 (!) 107  Resp: 17 20 18 17   Temp: 98.8 F (37.1 C) 98.4 F (36.9 C) 98.6 F (37 C) 97.9 F (36.6 C)  TempSrc: Oral Oral Oral Oral  SpO2: 96% 94% 98% 97%  Weight:      Height:        Intake/Output Summary (Last 24 hours) at 08/05/2018 1649 Last data filed at 08/05/2018 0635 Gross per 24 hour  Intake 668.61 ml  Output 2 ml  Net 666.61 ml   Filed Weights   07/31/18 2325  Weight: 85.2 kg    Examination: General appearance: Thin adult male, no acute distress, sitting in bed, appears tired. HEENT: Icteric, lids and lashes normal, lips dry, dentition normal, hearing normal. Skin: Skin warm  and dry, no suspicious rashes or lesions.  Moderate jaundice. Cardiac: Regular rate and rhythm, no murmurs, 2+ lower extremity edema. Respiratory: Respiratory effort normal without rales or wheezes.   Abdomen: Abdomen nonfocal tenderness, voluntary guarding in the right upper quadrant, no rebound or rigidity. MSK: No deformities or effusions of the large joints of the upper or lower extremities  bilaterally, moderate loss of subcutaneous muscle mass and fat. Neuro: Awake and alert, extraocular movements intact, moves all extremities with global weakness, symmetric strength.  Speech fluent. Psych: Sensorium intact and responding to questions, oriented to person, place, and time.  Attention normal, affect blunted.      Data Reviewed: I have personally reviewed following labs and imaging studies:  CBC: Recent Labs  Lab 08/01/18 0323 08/02/18 0414 08/03/18 0612 08/04/18 0347 08/04/18 1118 08/05/18 0503  WBC 22.2* 17.7* 15.9* 14.0*  --  16.4*  HGB 8.5* 7.2* 7.4* 8.0*  --  8.1*  HCT 26.8* 22.7* 22.9* 24.7*  --  24.9*  MCV 84.0 85.0 84.5 84.3  --  82.7  PLT 441* 368 367 408* 379 637*   Basic Metabolic Panel: Recent Labs  Lab 08/01/18 0323 08/02/18 0414 08/03/18 0612 08/04/18 0347 08/05/18 0503  NA 136 138 139 134* 137  K 5.0 4.3 3.9 4.2 4.2  CL 102 107 110 106 107  CO2 25 23 22  19* 21*  GLUCOSE 75 88 92 121* 118*  BUN 30* 32* 28* 26* 34*  CREATININE 1.28* 1.16 1.08 0.88 0.91  CALCIUM 12.3* 10.9* 10.2 9.6 9.6   GFR: Estimated Creatinine Clearance: 104.6 mL/min (by C-G formula based on SCr of 0.91 mg/dL). Liver Function Tests: Recent Labs  Lab 08/01/18 0323 08/02/18 0414 08/03/18 0612 08/04/18 0347 08/05/18 0503  AST 125* 133* 140* 121* 119*  ALT 53* 53* 63* 62* 69*  ALKPHOS 205* 165* 157* 163* 164*  BILITOT 11.8* 8.7* 8.2* 8.9* 9.5*  PROT 7.2 5.9* 5.9* 6.1* 6.2*  ALBUMIN 2.4* 1.7* 1.7* 1.7* 1.7*   Recent Labs  Lab 07/31/18 1818  LIPASE 52*   Recent Labs  Lab 07/31/18 1444  AMMONIA 24   Coagulation Profile: Recent Labs  Lab 08/04/18 1118  INR 1.53   Cardiac Enzymes: No results for input(s): CKTOTAL, CKMB, CKMBINDEX, TROPONINI in the last 168 hours. BNP (last 3 results) No results for input(s): PROBNP in the last 8760 hours. HbA1C: No results for input(s): HGBA1C in the last 72 hours. CBG: No results for input(s): GLUCAP in the last 168  hours. Lipid Profile: No results for input(s): CHOL, HDL, LDLCALC, TRIG, CHOLHDL, LDLDIRECT in the last 72 hours. Thyroid Function Tests: No results for input(s): TSH, T4TOTAL, FREET4, T3FREE, THYROIDAB in the last 72 hours. Anemia Panel: Recent Labs    08/04/18 0347 08/04/18 1119  VITAMINB12  --  1,827*  RETICCTPCT 2.2  --    Urine analysis:    Component Value Date/Time   COLORURINE AMBER (A) 07/31/2018 1750   APPEARANCEUR HAZY (A) 07/31/2018 1750   LABSPEC 1.020 07/31/2018 1750   PHURINE 5.0 07/31/2018 1750   GLUCOSEU NEGATIVE 07/31/2018 1750   HGBUR SMALL (A) 07/31/2018 1750   BILIRUBINUR SMALL (A) 07/31/2018 1750   KETONESUR NEGATIVE 07/31/2018 1750   PROTEINUR NEGATIVE 07/31/2018 1750   NITRITE NEGATIVE 07/31/2018 1750   LEUKOCYTESUR NEGATIVE 07/31/2018 1750   Sepsis Labs: @LABRCNTIP (procalcitonin:4,lacticacidven:4)  ) Recent Results (from the past 240 hour(s))  Culture, Blood     Status: None   Collection Time: 07/31/18 11:34 AM  Result Value Ref  Range Status   Specimen Description   Final    BLOOD LEFT ARM Performed at [facility], [place] [address] [place], [place] [address]    Special Requests   Final    NONE Performed at [facility], [place] [address] [place], [place] [address]    Culture   Final    NO GROWTH 5 DAYS Performed at [facility], [place] [address] [place], [place] [address]    Report Status 08/05/2018 FINAL  Final  Culture, Blood     Status: None   Collection Time: 07/31/18 11:38 AM  Result Value Ref Range Status   Specimen Description   Final    BLOOD RIGHT ARM Performed at [facility], [place] [address] [place], [place] [address]    Special Requests   Final    NONE Performed at [facility], [place] [address], [place], [place] [address]    Culture   Final    NO GROWTH 5 DAYS Performed at [facility], [place] [address]  [place], [place] [address]    Report Status 08/05/2018 FINAL  Final  Urine culture     Status: None   Collection Time: 07/31/18  5:50 PM  Result Value Ref Range Status   Specimen Description   Final    URINE, CLEAN CATCH Performed at [facility], [place] [address], [place], [place] [address]    Special Requests   Final    NONE Performed at [facility], [place] [address], [place], [place] [address]    Culture   Final    NO GROWTH Performed at [facility], [place] [address] [place], [place] [address]    Report Status 08/02/2018 FINAL  Final  MRSA PCR Screening     Status: None   Collection Time: 07/31/18 11:28 PM  Result Value Ref Range Status   MRSA by PCR NEGATIVE NEGATIVE Final    Comment:        The GeneXpert MRSA Assay (FDA approved for NASAL specimens only), is one component of a comprehensive MRSA colonization surveillance program. It is not intended to diagnose MRSA infection nor to guide or monitor treatment for MRSA infections. Performed at [facility], [place] [address] [place], [place] [address]          Radiology Studies: No results found.      Scheduled Meds: . amLODipine  10 mg Oral Daily  . chlorhexidine  15 mL Mouth Rinse BID  . enoxaparin (LOVENOX) injection  40 mg Subcutaneous Q24H  . feeding supplement (ENSURE ENLIVE)  237 mL Oral Daily  . FLUOROURACIL (ADRUCIL) CHEMO infusion For Inpatient Use  1,500 mg/m2 (Treatment Plan Recorded) Intravenous Once  . hydrocortisone cream   Topical BID  . mouth rinse  15 mL Mouth Rinse q12n4p  . protein supplement shake  11 oz Oral BID BM   Continuous Infusions:    LOS: 5 days    Time spent: 25 minutes    Edwin Dada, Roman [name] 08/05/2018, 4:49 PM     Pager [phone] --- please page though AMION:  [email or web address] Password [email or web address] If 7PM-7AM, please contact night-coverage

## 2018-08-05 NOTE — Progress Notes (Signed)
IP PROGRESS NOTE  Subjective:   Mr. Sunday continues the 5-fluorouracil infusion.  No nausea.  He reports getting out of bed and ambulating. Objective: Vital signs in last 24 hours: Blood pressure (!) 147/84, pulse (!) 107, temperature 98.6 F (37 C), temperature source Oral, resp. rate 18, height 5\' 11"  (1.803 m), weight 187 lb 13.3 oz (85.2 kg), SpO2 98 %.  Intake/Output from previous day: 11/09 0701 - 11/10 0700 In: 1218.6 [P.O.:910; IV Piggyback:308.6] Out: 2 [Urine:2]  Physical Exam:  HEENT: Scleral icterus, no thrush  Abdomen: The liver is palpable throughout the upper abdomen Extremities: 1+ edema at the low leg bilaterally   Portacath/PICC-without erythema  Lab Results: Recent Labs    08/04/18 0347 08/04/18 1118 08/05/18 0503  WBC 14.0*  --  16.4*  HGB 8.0*  --  8.1*  HCT 24.7*  --  24.9*  PLT 408* 379 408*    BMET Recent Labs    08/04/18 0347 08/05/18 0503  NA 134* 137  K 4.2 4.2  CL 106 107  CO2 19* 21*  GLUCOSE 121* 118*  BUN 26* 34*  CREATININE 0.88 0.91  CALCIUM 9.6 9.6    Lab Results  Component Value Date   CEA1 33.7 (H) 07/20/2018    Studies/Results: No results found.  Medications: I have reviewed the patient's current medications.  Assessment/Plan:  1.  Metastatic colon cancer-cycle 1 FOLFOX 08/03/2018 2.  Anemia status post 1 unit of packed red blood cells on 08/03/2018 3.  Hypercalcemia, status post pamidronate 08/01/2018-improved 4.  Coagulopathy secondary to extensive liver metastases/malnutrition  Mr. Rosselli is completing cycle 1 FOLFOX.  He appears to be tolerating the chemotherapy well.  He is stable from a hematologic standpoint.  The coagulopathy, high LDH, and elevated d-dimer are likely related to extensive liver involvement with metastatic colon cancer.  I have a low clinical suspicion for hemolysis.  Recommendations: 1.  Complete cycle 1 FOLFOX today 2.  Discharge plan per Dr. Loleta Books and Dr. Burr Medico     Please call  oncology as needed on 08/05/2018.  Dr. Burr Medico will return on 08/06/2018.  Outpatient follow-up will be scheduled with Dr. Burr Medico     LOS: 5 days   Betsy Coder, MD   08/05/2018, 8:18 AM

## 2018-08-06 LAB — COMPREHENSIVE METABOLIC PANEL
ALBUMIN: 1.8 g/dL — AB (ref 3.5–5.0)
ALT: 75 U/L — ABNORMAL HIGH (ref 0–44)
ANION GAP: 7 (ref 5–15)
AST: 120 U/L — ABNORMAL HIGH (ref 15–41)
Alkaline Phosphatase: 197 U/L — ABNORMAL HIGH (ref 38–126)
BUN: 29 mg/dL — ABNORMAL HIGH (ref 6–20)
CHLORIDE: 109 mmol/L (ref 98–111)
CO2: 23 mmol/L (ref 22–32)
Calcium: 9.7 mg/dL (ref 8.9–10.3)
Creatinine, Ser: 0.9 mg/dL (ref 0.61–1.24)
GFR calc Af Amer: >60 mL/min (ref >60)
GFR calc non Af Amer: >60 mL/min (ref >60)
GLUCOSE: 97 mg/dL (ref 70–99)
POTASSIUM: 4 mmol/L (ref 3.5–5.1)
SODIUM: 139 mmol/L (ref 135–145)
Total Bilirubin: 11.2 mg/dL — ABNORMAL HIGH (ref 0.3–1.2)
Total Protein: 6 g/dL — ABNORMAL LOW (ref 6.5–8.1)

## 2018-08-06 LAB — CBC
HEMATOCRIT: 25.8 % — AB (ref 39.0–52.0)
HEMOGLOBIN: 8.4 g/dL — AB (ref 13.0–17.0)
MCH: 26.7 pg (ref 26.0–34.0)
MCHC: 32.6 g/dL (ref 30.0–36.0)
MCV: 81.9 fL (ref 80.0–100.0)
NRBC: 0.2 % (ref 0.0–0.2)
PLATELETS: 399 10*3/uL (ref 150–400)
RBC: 3.15 MIL/uL — AB (ref 4.22–5.81)
RDW: 16 % — ABNORMAL HIGH (ref 11.5–15.5)
WBC: 14.5 10*3/uL — ABNORMAL HIGH (ref 4.0–10.5)

## 2018-08-06 LAB — METHYLMALONIC ACID, SERUM: Methylmalonic Acid, Quantitative: 328 nmol/L (ref 0–378)

## 2018-08-06 MED ORDER — PROCHLORPERAZINE MALEATE 10 MG PO TABS: 10.0000 mg | ORAL_TABLET | Freq: Four times a day (QID) | ORAL | 2 refills | Status: AC | PRN

## 2018-08-06 MED ORDER — SODIUM CHLORIDE 0.9 % IV SOLN: INTRAVENOUS | Status: DC | PRN

## 2018-08-06 MED ORDER — NYSTATIN 100000 UNIT/ML MT SUSP: 5.0000 mL | Freq: Four times a day (QID) | OROMUCOSAL | 0 refills | Status: AC

## 2018-08-06 MED ORDER — ONDANSETRON 8 MG PO TBDP: 8.0000 mg | ORAL_TABLET | Freq: Three times a day (TID) | ORAL | 2 refills | Status: AC | PRN

## 2018-08-06 NOTE — Discharge Summary (Signed)
Physician Discharge Summary  Jeffery Roman:[id number] DOB: October 28, 1965 DOA: 07/31/2018  PCP: Jeffery Dust, L.Marlou Sa, MD  Admit date: 07/31/2018 Discharge date: 08/06/2018  Admitted From: Home  Disposition:  Home   Recommendations for Outpatient Follow-up:  1. Follow up with Oncology in 3-4 days 2. Please obtain BMP/CBC at follow up    Home Health: None  Equipment/Devices: None  Discharge Condition: Fair  CODE STATUS: FULL Diet recommendation: Regular  Brief/Interim Summary: Jeffery Roman is a 52 y.o. M with HTN and newly diagnosed metastatic colon cancer who presented to [facility] for port placement, found to have fever, tachycardia, hyperCa and elevated bilirubin.     PRINCIPAL HOSPITAL DIAGNOSIS: Sepsis, unspecified organism in context of metastatic colon cancer    Discharge Diagnoses:   Metastatic colon cancer Diagnosed with metastatic cancer in Oct 2019 due to obstructive pancreatitis, found incidentally.  Colon biopsy showed adenoCA, liver lesion biopsy same.  Presented to [facility] for port placement, found to be febrile, leukocytosis, hyperCA.  Stabilized with antibiotics and IV fluids.  PICC placed and FOLFOX cycle 1 delivered in hospital.  Tolerated well initially.  Palliative met with patient.  Close oncology follow up arranged.   Sepsis, likely from LLL pneumonia Presented with fever 102F, WBC 22K, lactic acid >2.  UA unremarkable, CXR clear.  CT abdomen showed LLL pneumonia.  Started on Zosyn, completed 5 days antibiotics with Levaquin. No further fever.     Hypercalcemia Calcium normalized with pamidronate.  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  Iron studies showed deficiency.  No hemolysis.   Given feraheme.     Severe protein calorie malnutrition   Leg swelling Due to IV fluids and low albumin.  Monitor.  Diuretics as  needed per Oncology.           Discharge Instructions  Discharge Instructions    Diet general   Complete by:  As directed    Discharge instructions   Complete by:  As directed    From Dr. Loleta Books: You were admitted with fever. This fever may have been from pneumonia (lung infection) and so you were treated with a complete course of antibiotics for that. It may have been however, just from cancer, and so we have started chemotherapy here in the hospital, round 1 of FOLFOX. Your next appointment with Dr. Burr Medico will be this week. As she said, they will tentatively plan for IV fluids or a transfusion at that appointment, if needed. I discussed with her after she left, you should keep your PICC for now, they will discuss a port later.  With regard to symptoms: Take oxycodone 5 mg as needed for pain For nausea: Take either ondansetron dissolvable tabs 8 mg every 8 hours as needed OR Prochlorperazine/Compazine 10 mg up to every 6 hours as needed   For the oral thrush (whitish plaques in your mouth): Take nystatin swish and swallow 4 times daily until gone   You can resume your normal amlodipine  You may take omeprazole/Prilosec if needed, or loratadine/Claritin, if needed (Those don't interact with your other medicines)  Take the nutritional supplements, 2 or 3 per day, as you are able. Here, you were getting Ensure Enlive and Premier Protein supplements.   Increase activity slowly   Complete by:  As directed      Allergies as of 08/06/2018      Reactions   Ciprofloxacin  Other (See Comments)   Too strong    Lisinopril Other (See Comments)   ED      Medication List    TAKE these medications   amLODipine 10 MG tablet Commonly known as:  NORVASC Take 1 tablet (10 mg total) by mouth daily.   feeding supplement (ENSURE ENLIVE) Liqd Take 237 mLs by mouth daily.   loratadine 10 MG tablet Commonly known as:  CLARITIN Take 10 mg by mouth daily.   multivitamin with  minerals Tabs tablet Take 1 tablet by mouth daily.   nystatin 100000 UNIT/ML suspension Commonly known as:  MYCOSTATIN Take 5 mLs (500,000 Units total) by mouth 4 (four) times daily.   omeprazole 20 MG capsule Commonly known as:  PRILOSEC Take 20 mg by mouth daily.   ondansetron 8 MG disintegrating tablet Commonly known as:  ZOFRAN-ODT Take 1 tablet (8 mg total) by mouth every 8 (eight) hours as needed for nausea or vomiting.   ondansetron 8 MG tablet Commonly known as:  ZOFRAN Take 1 tablet (8 mg total) by mouth every 8 (eight) hours as needed for nausea or vomiting.   oxyCODONE 5 MG immediate release tablet Commonly known as:  Oxy IR/ROXICODONE Take 1 tablet (5 mg total) by mouth every 4 (four) hours as needed for moderate pain.   polyethylene glycol packet Commonly known as:  MIRALAX / GLYCOLAX Take 17 g by mouth daily as needed for moderate constipation.   prochlorperazine 10 MG tablet Commonly known as:  COMPAZINE Take 1 tablet (10 mg total) by mouth every 6 (six) hours as needed (NAUSEA).   protein supplement shake Liqd Commonly known as:  PREMIER PROTEIN Take 325 mLs (11 oz total) by mouth 2 (two) times daily between meals.       Allergies  Allergen Reactions  . Ciprofloxacin Other (See Comments)    Too strong   . Lisinopril Other (See Comments)    ED    Consultations:  Oncology, Dr. Burr Medico   Procedures/Studies: Dg Chest 2 View  Result Date: 07/31/2018 CLINICAL DATA:  Fever.  Liver metastasis. EXAM: CHEST - 2 VIEW COMPARISON:  None. FINDINGS: The heart size and mediastinal contours are within normal limits. Both lungs are clear. The lung volumes are low. The visualized skeletal structures are unremarkable. IMPRESSION: No active cardiopulmonary disease. Electronically Signed   By: Abelardo Diesel M.D.   On: 07/31/2018 18:43   Dg Abd 1 View - Kub  Result Date: 07/20/2018 CLINICAL DATA:  Evaluate for free peritoneal air. EXAM: ABDOMEN - 1 VIEW COMPARISON:   CT 07/18/2018 FINDINGS: Moderate air and contrast throughout the colon. No definite free peritoneal air. Remainder the exam is unremarkable. IMPRESSION: Nonspecific, nonobstructive bowel gas pattern. No definite free peritoneal air. Recommend upright KUB with diaphragms in field of view versus right lateral decubitus film for better evaluation for free air. Electronically Signed   By: Marin Olp M.D.   On: 07/20/2018 11:03   Ct Chest W Contrast  Result Date: 07/23/2018 CLINICAL DATA:  Inpatient admitted with abdominal pain, pancreatitis and weight loss. Malignant-appearing partially obstructing tumor in the ascending colon biopsied 1 day prior at colonoscopy, pathology pending. Multiple liver masses suspicious for liver metastases at CT. Chest staging. EXAM: CT CHEST WITH CONTRAST TECHNIQUE: Multidetector CT imaging of the chest was performed during intravenous contrast administration. CONTRAST:  26m OMNIPAQUE IOHEXOL 300 MG/ML  SOLN COMPARISON:  07/18/2018 CT abdomen/pelvis. FINDINGS: Cardiovascular: Normal heart size. No significant pericardial effusion/thickening. Great vessels are normal in course and  caliber. No central pulmonary emboli. Mediastinum/Nodes: No discrete thyroid nodules. Unremarkable esophagus. No pathologically enlarged axillary, mediastinal or hilar lymph nodes. Lungs/Pleura: No pneumothorax. Small dependent bilateral pleural effusions. Mild-to-moderate compressive atelectasis in the dependent lower lobes, left greater than right. No acute consolidative airspace disease, lung masses or significant pulmonary nodules. Upper abdomen: Numerous hypodense liver masses scattered throughout the visualized liver, unchanged from recent CT. Musculoskeletal:  No aggressive appearing focal osseous lesions. IMPRESSION: 1. No findings suspicious for metastatic disease in the chest. 2. Small dependent bilateral pleural effusions with associated dependent lower lobe atelectasis. 3. Redemonstration of  innumerable hypodense liver masses suspicious for liver metastases. Electronically Signed   By: Ilona Sorrel M.D.   On: 07/23/2018 13:55   Nm Bone Scan Whole Body  Result Date: 07/25/2018 CLINICAL DATA:  History of colon cancer. EXAM: NUCLEAR MEDICINE WHOLE BODY BONE SCAN TECHNIQUE: Whole body anterior and posterior images were obtained approximately 3 hours after intravenous injection of radiopharmaceutical. RADIOPHARMACEUTICALS:  20.1 mCi Technetium-42mMDP IV COMPARISON:  PET scan of July 23, 2018. FINDINGS: Symmetric uptake is seen involving both shoulder regions suggesting degenerative change. No other abnormal uptake is noted. IMPRESSION: No scintigraphic evidence of osseous metastases. Electronically Signed   By: JMarijo Conception M.D.   On: 07/25/2018 14:15   Ct Abdomen Pelvis W Contrast  Result Date: 07/31/2018 CLINICAL DATA:  Abdominal distension. Hypotension and tachycardia. Elevated bilirubin. History of metastatic colon cancer. EXAM: CT ABDOMEN AND PELVIS WITH CONTRAST TECHNIQUE: Multidetector CT imaging of the abdomen and pelvis was performed using the standard protocol following bolus administration of intravenous contrast. CONTRAST:  1025mISOVUE-300 IOPAMIDOL (ISOVUE-300) INJECTION 61% COMPARISON:  Abdominal MRI 07/23/2018. CT abdomen and pelvis 07/18/2018. FINDINGS: Lower chest: Consolidative opacities in the left greater than right lower lobes and lingula. No pleural effusion. Hepatobiliary: Numerous hypoattenuating lesions are again seen throughout the liver with some appearing mildly larger than on the prior studies (for example a 4.3 cm segment IVa lesion on series 3, image 33 which measured 3.7 cm on the prior CT). Layering hyperattenuating material in the gallbladder, possibly sludge. Possible mild gallbladder wall thickening. No biliary dilatation. Pancreas: Nearly completely resolved peripancreatic inflammation. No pancreatic ductal dilatation. Spleen: Unremarkable.  Adrenals/Urinary Tract: Unremarkable adrenal glands. No evidence of renal mass, calculi, or hydronephrosis. Unremarkable bladder. Stomach/Bowel: Masslike wall thickening in the ascending colon as previously seen. No bowel obstruction. Unremarkable appendix. Vascular/Lymphatic: Normal caliber of the abdominal aorta. 3.9 x 2.8 cm right lower quadrant mesenteric lymph node, mildly enlarged from the prior CT (previously 3.2 x 2.5 cm). Reproductive: Unremarkable prostate. Other: Small volume ascites. No pneumoperitoneum. Small fat containing umbilical hernia. Musculoskeletal: No suspicious osseous lesion. Severe L5-S1 disc space narrowing with vacuum disc and degenerative endplate sclerosis as well as asymmetric left neural foraminal stenosis. IMPRESSION: 1. Mild worsening of diffuse liver metastases and right lower quadrant mesenteric lymphadenopathy. 2. Ascending colon mass as previously described. 3. Nearly completely resolved pancreatic inflammation. 4. Left greater than right basilar lung opacities which may reflect pneumonia or atelectasis. 5. Persistent small volume ascites. Electronically Signed   By: AlLogan Bores.D.   On: 07/31/2018 19:54   Ct Abdomen Pelvis W Contrast  Addendum Date: 07/23/2018   ADDENDUM REPORT: 07/23/2018 15:19 ADDENDUM: RIGHT lower quadrant 3.2 cm solid nodule (axial 69/112) consistent with metastatic lymphadenopathy. Electronically Signed   By: CoElon Alas.D.   On: 07/23/2018 15:19   Result Date: 07/23/2018 CLINICAL DATA:  Worsening epigastric pain for a week, vomiting today.  On treatment for prostatitis. EXAM: CT ABDOMEN AND PELVIS WITH CONTRAST TECHNIQUE: Multidetector CT imaging of the abdomen and pelvis was performed using the standard protocol following bolus administration of intravenous contrast. CONTRAST:  180m ISOVUE-300 IOPAMIDOL (ISOVUE-300) INJECTION 61% COMPARISON:  CT pelvis December 08, 2014 FINDINGS: LOWER CHEST: No pericardial effusions. Lower lobe  atelectasis. Heart size is normal HEPATOBILIARY: Numerous rounded hypodensities throughout the liver measuring to 3.7 cm. Liver size is normal. Normal gallbladder. PANCREAS: Peripancreatic inflammation without parenchymal hypodensity, ductal dilatation, abnormal calcifications or mass. SPLEEN: Normal. ADRENALS/URINARY TRACT: Kidneys are orthotopic, demonstrating symmetric enhancement. No nephrolithiasis, hydronephrosis or solid renal masses. The unopacified ureters are normal in course and caliber. Delayed imaging through the kidneys demonstrates symmetric prompt contrast excretion within the proximal urinary collecting system. Urinary bladder is partially distended and unremarkable. Normal adrenal glands. STOMACH/BOWEL: Eccentric posterior thickening of the ascending colon (axial image 61). The stomach, small bowel are normal in course and caliber without inflammatory changes. Normal appendix. VASCULAR/LYMPHATIC: Aortoiliac vessels are normal in course and caliber. No lymphadenopathy by CT size criteria. REPRODUCTIVE: Normal. OTHER: No intraperitoneal free fluid or free air. Small amount of ascites predominantly in the LEFT upper quadrant and LEFT pericolic gutter. No intraperitoneal free air or focal fluid collection. MUSCULOSKELETAL: Nonacute. Severe L5-S1 disc height loss with endplate sclerosis and vacuum disc compatible with degenerative disc resulting in severe LEFT L5-S1 neural foraminal narrowing. IMPRESSION: 1. Acute pancreatitis without necrosis. 2. Eccentric ascending colon wall thickening concerning for primary neoplasm. Multiple hepatic metastasis. Recommend colonoscopy and/or PET-CT on non emergent basis. 3. Acute findings discussed with and reconfirmed by Dr.MOLPUS on 07/18/2018 at 11:42 pm. Electronically Signed: By: CElon AlasM.D. On: 07/18/2018 23:45   Mr 3d Recon At Scanner  Result Date: 07/23/2018 CLINICAL DATA:  Pancreatic adenocarcinoma. EXAM: MRI ABDOMEN WITHOUT AND WITH CONTRAST  (INCLUDING MRCP) TECHNIQUE: Multiplanar multisequence MR imaging of the abdomen was performed both before and after the administration of intravenous contrast. Heavily T2-weighted images of the biliary and pancreatic ducts were obtained, and three-dimensional MRCP images were rendered by post processing. CONTRAST:  9 cc of Gadavist COMPARISON:  CT AP 07/18/2018 FINDINGS: Lower chest: There are small to moderate bilateral pleural effusions identified. Hepatobiliary: Innumerable peripheral enhancing lesions are identified throughout both lobes of liver compatible with metastatic disease. -index lesion within segment 2 measures 2.9 cm, image 19/3. -Index lesion within segment 4 a measures 3.9 cm, image 21/3. -Index lesion within segment 8 measures 3.8 cm, image number 20/3. -index segment 6 lesion measures 3.4 cm, image 46/3. The gallbladder appears unremarkable.  No biliary dilatation. Pancreas: Mild diffuse pancreatic edema noted. No discrete mass identified. No main duct dilatation. Spleen:  Within normal limits in size and appearance. Adrenals/Urinary Tract: Normal appearance of the adrenal glands. No kidney mass or hydronephrosis identified. Stomach/Bowel: No abnormal bowel dilatation identified. Right colon mass is noted measuring 4.3 cm. Vascular/Lymphatic: Normal appearance of the abdominal aorta. No aneurysm. Within the right lower quadrant of the abdomen there is and enlarged enhancing lymph node measuring 2.7 cm, image 35/12. Other:  None Musculoskeletal: No suspicious bone lesions identified. IMPRESSION: 1. There are innumerable peripherally enhancing liver lesions most concerning for diffuse metastatic disease. 2. Mild inflammatory changes of the pancreas compatible with pancreatitis. No complications identified. 3. Ascending colon mass concerning for primary colonic neoplasm. A soft tissue nodule is noted within the right lower quadrant of the abdomen suspicious for ileocolic mesenteric adenopathy. 4.  Bilateral pleural effusions. Electronically Signed   By: TLovena Le  Clovis Riley M.D.   On: 07/23/2018 11:35   US Biopsy (liver)  Result Date: 07/24/2018 CLINICAL DATA:  Innumerable masses throughout the liver and ascending colonic mass. EXAM: ULTRASOUND GUIDED CORE BIOPSY OF LIVER MEDICATIONS: 3.0 mg IV Versed; 100 mcg IV Fentanyl Total Moderate Sedation Time: 10 minutes. The patient's level of consciousness and physiologic status were continuously monitored during the procedure by Radiology nursing. PROCEDURE: The procedure, risks, benefits, and alternatives were explained to the patient. Questions regarding the procedure were encouraged and answered. The patient understands and consents to the procedure. A time out was performed prior to initiating the procedure. Ultrasound was performed to localize liver lesions. The abdominal wall was prepped with chlorhexidine in a sterile fashion, and a sterile drape was applied covering the operative field. A sterile gown and sterile gloves were used for the procedure. Local anesthesia was provided with 1% Lidocaine. A 17 gauge needle was advanced into the left lobe. Three separate 18 gauge coaxial core biopsy samples were obtained and submitted in formalin. Gel-Foam pledgets were advanced through the outer needle as the needle was retracted. Additional ultrasound was performed. COMPLICATIONS: None. FINDINGS: Multiple masses are seen throughout the liver parenchyma. An area in the left lobe was chosen for biopsy of confluent masses. Solid tissue was obtained. IMPRESSION: Ultrasound-guided core biopsy performed at the level of masses within the left lobe of the liver. Electronically Signed   By: Aletta Edouard M.D.   On: 07/24/2018 15:30   Mr Abdomen Mrcp W XI Contast  Result Date: 07/23/2018 CLINICAL DATA:  Pancreatic adenocarcinoma. EXAM: MRI ABDOMEN WITHOUT AND WITH CONTRAST (INCLUDING MRCP) TECHNIQUE: Multiplanar multisequence MR imaging of the abdomen was performed  both before and after the administration of intravenous contrast. Heavily T2-weighted images of the biliary and pancreatic ducts were obtained, and three-dimensional MRCP images were rendered by post processing. CONTRAST:  9 cc of Gadavist COMPARISON:  CT AP 07/18/2018 FINDINGS: Lower chest: There are small to moderate bilateral pleural effusions identified. Hepatobiliary: Innumerable peripheral enhancing lesions are identified throughout both lobes of liver compatible with metastatic disease. -index lesion within segment 2 measures 2.9 cm, image 19/3. -Index lesion within segment 4 a measures 3.9 cm, image 21/3. -Index lesion within segment 8 measures 3.8 cm, image number 20/3. -index segment 6 lesion measures 3.4 cm, image 46/3. The gallbladder appears unremarkable.  No biliary dilatation. Pancreas: Mild diffuse pancreatic edema noted. No discrete mass identified. No main duct dilatation. Spleen:  Within normal limits in size and appearance. Adrenals/Urinary Tract: Normal appearance of the adrenal glands. No kidney mass or hydronephrosis identified. Stomach/Bowel: No abnormal bowel dilatation identified. Right colon mass is noted measuring 4.3 cm. Vascular/Lymphatic: Normal appearance of the abdominal aorta. No aneurysm. Within the right lower quadrant of the abdomen there is and enlarged enhancing lymph node measuring 2.7 cm, image 35/12. Other:  None Musculoskeletal: No suspicious bone lesions identified. IMPRESSION: 1. There are innumerable peripherally enhancing liver lesions most concerning for diffuse metastatic disease. 2. Mild inflammatory changes of the pancreas compatible with pancreatitis. No complications identified. 3. Ascending colon mass concerning for primary colonic neoplasm. A soft tissue nodule is noted within the right lower quadrant of the abdomen suspicious for ileocolic mesenteric adenopathy. 4. Bilateral pleural effusions. Electronically Signed   By: Kerby Moors M.D.   On: 07/23/2018  11:35   [place] Ekg Site Rite  Result Date: 08/02/2018 If Site Rite image not attached, placement could not be confirmed due to current cardiac rhythm.  US Abdomen Limited Ruq  Result Date: 07/31/2018 CLINICAL DATA:  Worsened pain, jaundice and elevated white count. Metastatic colon cancer. EXAM: ULTRASOUND ABDOMEN LIMITED RIGHT UPPER QUADRANT COMPARISON:  Multiple CT and MR studies of the last 2 weeks. FINDINGS: Gallbladder: Gallbladder contains sludge but no shadowing stones. No Murphy sign. Maximal wall thickness 3 mm. No surrounding fluid. Common bile duct: Diameter: 6 mm Liver: Innumerable metastatic lesions throughout the liver. Previously evaluated by CT. Portal vein is patent on color Doppler imaging with normal direction of blood flow towards the liver. IMPRESSION: Widespread and advanced hepatic metastatic disease as known by previous imaging studies. The gallbladder contains sludge but no shadowing stones. There is no Murphy sign. Borderline gallbladder wall thickness but no surrounding fluid. The overall balance of the information argues against cholecystitis. Electronically Signed   By: Nelson Chimes M.D.   On: 07/31/2018 20:43       Subjective: Appetite okay.  Spit up a little blood last night, none since.  No melena, hematochezia, no more diarrhea nor constipation.  No hemopytsis.  No fever, confusion, syncope, seizure.  Discharge Exam: Vitals:   08/05/18 2009 08/06/18 0415  BP: 140/88 (!) 153/78  Pulse: (!) 115 100  Resp: 14 16  Temp: 98.3 F (36.8 C) 99.6 F (37.6 C)  SpO2: 98% 97%   Vitals:   08/05/18 0607 08/05/18 1428 08/05/18 2009 08/06/18 0415  BP: (!) 147/84 (!) 144/85 140/88 (!) 153/78  Pulse: (!) 107 (!) 107 (!) 115 100  Resp: _0 Temp: 98.6 F (37 C) 97.9 F (36.6 C) 98.3 F (36.8 C) 99.6 F (37.6 C)  TempSrc: Oral Oral Oral Oral  SpO2: 98% 97% 98% 97%  Weight:      Height:        General: Pt is alert, awake, not in acute distress, sitting in  recliner, appears tired. Cardiovascular: Tachycardia, regular, nl S1-S2, no murmurs appreciated.   1+ ankle LE edema.   Respiratory: Normal respiratory rate and rhythm.  CTAB without rales or wheezes. Abdominal: Abdomen soft and nonfocally tender.  No distension or HSM that I can tell. Neuro/Psych: Strength symmetric in upper and lower extremities.  Judgment and insight appear normal.   The results of significant diagnostics from this hospitalization (including imaging, microbiology, ancillary and laboratory) are listed below for reference.     Microbiology: Recent Results (from the past 240 hour(s))  Culture, Blood     Status: None   Collection Time: 07/31/18 11:34 AM  Result Value Ref Range Status   Specimen Description   Final    BLOOD LEFT ARM Performed at [facility], [address] [place], [place] [address]    Special Requests   Final    NONE Performed at [facility], [place] [address] [place], [place] [address]    Culture   Final    NO GROWTH 5 DAYS Performed at [facility], [place] [address], [place], [place] [address]    Report Status 08/05/2018 FINAL  Final  Culture, Blood     Status: None   Collection Time: 07/31/18 11:38 AM  Result Value Ref Range Status   Specimen Description   Final    BLOOD RIGHT ARM Performed at [facility], [place] [address], [place], [place] [address]    Special Requests   Final    NONE Performed at [facility], [place] [address] [place],  [address]    Culture  Final    NO GROWTH 5 DAYS Performed at [facility], [place] [address], [place], [place] [address]    Report Status 08/05/2018 FINAL  Final  Urine culture     Status: None   Collection Time: 07/31/18  5:50 PM  Result Value Ref Range Status   Specimen Description   Final    URINE, CLEAN CATCH Performed at [facility], [place] [address],  [place], [place] [address]    Special Requests   Final    NONE Performed at [facility], [place] [address], [place], [place] [address]    Culture   Final    NO GROWTH Performed at [facility], [place] [address], [place], [place] [address]    Report Status 08/02/2018 FINAL  Final  MRSA PCR Screening     Status: None   Collection Time: 07/31/18 11:28 PM  Result Value Ref Range Status   MRSA by PCR NEGATIVE NEGATIVE Final    Comment:        The GeneXpert MRSA Assay (FDA approved for NASAL specimens only), is one component of a comprehensive MRSA colonization surveillance program. It is not intended to diagnose MRSA infection nor to guide or monitor treatment for MRSA infections. Performed at [facility], [place] [address] [place], [place] [address]      Labs: BNP (last 3 results) No results for input(s): BNP in the last 8760 hours. Basic Metabolic Panel: Recent Labs  Lab 08/02/18 0414 08/03/18 0612 08/04/18 0347 08/05/18 0503 08/06/18 0523  NA 138 139 134* 137 139  K 4.3 3.9 4.2 4.2 4.0  CL 107 110 106 107 109  CO2 23 22 19* 21* 23  GLUCOSE 88 92 121* 118* 97  BUN 32* 28* 26* 34* 29*  CREATININE 1.16 1.08 0.88 0.91 0.90  CALCIUM 10.9* 10.2 9.6 9.6 9.7   Liver Function Tests: Recent Labs  Lab 08/02/18 0414 08/03/18 0612 08/04/18 0347 08/05/18 0503 08/06/18 0523  AST 133* 140* 121* 119* 120*  ALT 53* 63* 62* 69* 75*  ALKPHOS 165* 157* 163* 164* 197*  BILITOT 8.7* 8.2* 8.9* 9.5* 11.2*  PROT 5.9* 5.9* 6.1* 6.2* 6.0*  ALBUMIN 1.7* 1.7* 1.7* 1.7* 1.8*   Recent Labs  Lab 07/31/18 1818  LIPASE 52*   Recent Labs  Lab 07/31/18 1444  AMMONIA 24   CBC: Recent Labs  Lab 08/02/18 0414 08/03/18 0612 08/04/18 0347 08/04/18 1118 08/05/18 0503 08/06/18 0523  WBC 17.7* 15.9* 14.0*  --  16.4* 14.5*  HGB 7.2* 7.4* 8.0*  --  8.1* 8.4*  HCT 22.7* 22.9* 24.7*  --  24.9* 25.8*  MCV 85.0 84.5 84.3  --  82.7 81.9  PLT 368 367  408* 379 408* 399   Cardiac Enzymes: No results for input(s): CKTOTAL, CKMB, CKMBINDEX, TROPONINI in the last 168 hours. BNP: Invalid input(s): POCBNP CBG: No results for input(s): GLUCAP in the last 168 hours. D-Dimer Recent Labs    08/04/18 1118  DDIMER 3.66*   Hgb A1c No results for input(s): HGBA1C in the last 72 hours. Lipid Profile No results for input(s): CHOL, HDL, LDLCALC, TRIG, CHOLHDL, LDLDIRECT in the last 72 hours. Thyroid function studies No results for input(s): TSH, T4TOTAL, T3FREE, THYROIDAB in the last 72 hours.  Invalid input(s): FREET3 Anemia work up Recent Labs    08/04/18 0347 08/04/18 1119  VITAMINB12  --  1,827*  RETICCTPCT 2.2  --    Urinalysis    Component  Value Date/Time   COLORURINE AMBER (A) 07/31/2018 1750   APPEARANCEUR HAZY (A) 07/31/2018 1750   LABSPEC 1.020 07/31/2018 1750   PHURINE 5.0 07/31/2018 1750   GLUCOSEU NEGATIVE 07/31/2018 1750   HGBUR SMALL (A) 07/31/2018 1750   BILIRUBINUR SMALL (A) 07/31/2018 1750   KETONESUR NEGATIVE 07/31/2018 1750   PROTEINUR NEGATIVE 07/31/2018 1750   NITRITE NEGATIVE 07/31/2018 1750   LEUKOCYTESUR NEGATIVE 07/31/2018 1750   Sepsis Labs Invalid input(s): PROCALCITONIN,  WBC,  LACTICIDVEN Microbiology Recent Results (from the past 240 hour(s))  Culture, Blood     Status: None   Collection Time: 07/31/18 11:34 AM  Result Value Ref Range Status   Specimen Description   Final    BLOOD LEFT ARM Performed at [facility], [place] [address] [place], [place] [address]    Special Requests   Final    NONE Performed at [facility], [place] [address], [place], [place] [address]    Culture   Final    NO GROWTH 5 DAYS Performed at [facility], [place] [address], [place], [place] [address]    Report Status 08/05/2018 FINAL  Final  Culture, Blood     Status: None   Collection Time: 07/31/18 11:38 AM  Result Value Ref Range Status   Specimen  Description   Final    BLOOD RIGHT ARM Performed at [facility], [place] [address] [place], [place] [address]    Special Requests   Final    NONE Performed at [facility], [place] [address], [place], [place] [address]    Culture   Final    NO GROWTH 5 DAYS Performed at [facility], [place] [address], [place], [place] [address]    Report Status 08/05/2018 FINAL  Final  Urine culture     Status: None   Collection Time: 07/31/18  5:50 PM  Result Value Ref Range Status   Specimen Description   Final    URINE, CLEAN CATCH Performed at [facility], [place] [address] [place], [place] [address]    Special Requests   Final    NONE Performed at [facility], [place] [address], [place], [place] [address]    Culture   Final    NO GROWTH Performed at [facility], [place] [address], [place],  [address]    Report Status 08/02/2018 FINAL  Final  MRSA PCR Screening     Status: None   Collection Time: 07/31/18 11:28 PM  Result Value Ref Range Status   MRSA by PCR NEGATIVE NEGATIVE Final    Comment:        The GeneXpert MRSA Assay (FDA approved for NASAL specimens only), is one component of a comprehensive MRSA colonization surveillance program. It is not intended to diagnose MRSA infection nor to guide or monitor treatment for MRSA infections. Performed at [facility], [place] [address], [place],  [address]      Time coordinating discharge: 40 minutes        SIGNED:   Edwin Dada, MD  [name] 08/06/2018, 1:55 PM

## 2018-08-06 NOTE — Progress Notes (Signed)
   08/06/18 1200  Clinical Encounter Type  Visited With Patient and family together  Visit Type Initial;Psychological support;Spiritual support  Referral From Palliative care team  Consult/Referral To Chaplain  Spiritual Encounters  Spiritual Needs Other (Comment);Brochure Programmer, systems)  Stress Factors  Patient Stress Factors Health changes;Other (Comment)  Family Stress Factors Health changes;Other (Comment)  Advance Directives (For Healthcare)  Does Patient Have a Medical Advance Directive? No  Would patient like information on creating a medical advance directive? Yes (Inpatient - patient requests chaplain consult to create a medical advance directive) (Paperwork Given and Education Provided)   I visited with the patient per Spiritual Care consult to discuss an Advance Directive with the patient. The patient's wife was present at the bedside.  The patient stated that he want's his wife to make his healthcare decisions if he is unable to, but wanted to name his sister as a secondary person in the event that his wife is unable to make his healthcare decisions.  I went over the Advance Directive paperwork with the patient.  The patient and his wife are going to go over the document and the patient will have the nurse consult [place] when he is ready to complete the document.    Chaplain Shanon Ace M.Div., [facility]

## 2018-08-06 NOTE — Progress Notes (Signed)
Physical Therapy Treatment Patient Details Name: Jeffery Roman MRN: [id number] DOB: June 12, 1966 Today's Date: 08/06/2018    History of Present Illness 52 YO male admitted to ED from cancer center 11/5 for bilirubinemia, possible PNA. Pt with active metastatic colon cancer with mets to liver, starting chemo 11/8. PMH includes acute pancreatitis, ARF, HTN.     PT Comments    Pt progressing well, incr amb tolerance today; HR max 130 during gait, mild fatigue- one standing rest (~76min) during 440'; HR 116 end of session after brief seated rest; continue to follow in acute setting  Follow Up Recommendations  Supervision - Intermittent; no f/u PT     Equipment Recommendations  None recommended by PT    Recommendations for Other Services       Precautions / Restrictions Precautions Precautions: Fall Restrictions Weight Bearing Restrictions: No    Mobility  Bed Mobility Overal bed mobility: Needs Assistance;Modified Independent       Supine to sit: Modified independent (Device/Increase time)        Transfers Overall transfer level: Needs assistance Equipment used: None Transfers: Sit to/from Stand Sit to Stand: Supervision         General transfer comment: for safety  Ambulation/Gait Ambulation/Gait assistance: Supervision Gait Distance (Feet): 440 Feet Assistive device: None, IV pole Gait Pattern/deviations: Step-through pattern;Decreased stride length;Wide base of support         Stairs             Wheelchair Mobility    Modified Rankin (Stroke Patients Only)       Balance Overall balance assessment: Needs assistance Sitting-balance support: No upper extremity supported;Feet unsupported Sitting balance-Leahy Scale: Good     Standing balance support: No upper extremity supported Standing balance-Leahy Scale: Fair Standing balance comment: able to  tol only min challenges             High level balance activites: Backward  walking;Turns;Head turns High Level Balance Comments: no LOB            Cognition Arousal/Alertness: Awake/alert Behavior During Therapy: WFL for tasks assessed/performed Overall Cognitive Status: Within Functional Limits for tasks assessed                                        Exercises General Exercises - Lower Extremity Hip Flexion/Marching: AROM;Both;10 reps;Standing Toe Raises: AROM;Both;10 reps;Standing Heel Raises: AROM;Both;10 reps;Standing    General Comments        Pertinent Vitals/Pain Pain Assessment: No/denies pain    Home Living                      Prior Function            PT Goals (current goals can now be found in the care plan section) Acute Rehab PT Goals Patient Stated Goal: ambulate further  PT Goal Formulation: With patient/family Time For Goal Achievement: 08/17/18 Potential to Achieve Goals: Good Progress towards PT goals: Progressing toward goals    Frequency    Min 3X/week      PT Plan Current plan remains appropriate-update d/c plan    Co-evaluation              AM-PAC PT "6 Clicks" Daily Activity  Outcome Measure  Difficulty turning over in bed (including adjusting bedclothes, sheets and blankets)?: A Little Difficulty moving from lying on back to sitting on the side  of the bed? : A Little Difficulty sitting down on and standing up from a chair with arms (e.g., wheelchair, bedside commode, etc,.)?: A Little Help needed moving to and from a bed to chair (including a wheelchair)?: None Help needed walking in hospital room?: A Little Help needed climbing 3-5 steps with a railing? : A Little 6 Click Score: 19    End of Session   Activity Tolerance: Patient tolerated treatment well Patient left: in chair;with call bell/phone within reach Nurse Communication: Mobility status       Time: 1771-1657 PT Time Calculation (min) (ACUTE ONLY): 21 min  Charges:  $Gait Training: 8-22 mins                      Kenyon Ana, PT  Pager: [phone] Acute Rehab Dept [facility]): [phone]   08/06/2018    [facility] 08/06/2018, 12:58 PM

## 2018-08-07 LAB — FOLATE RBC
Folate, Hemolysate: 506.1 ng/mL
Folate, RBC: 2074 ng/mL (ref >498)
Hematocrit: 24.4 % — ABNORMAL LOW (ref 37.5–51.0)

## 2018-08-08 ENCOUNTER — Other Ambulatory Visit: Payer: Self-pay

## 2018-08-08 NOTE — Progress Notes (Signed)
Tumor board 08/01/18 ; Observation vs Dotatate PET scan to complete staging.

## 2018-08-09 ENCOUNTER — Other Ambulatory Visit: Payer: Self-pay | Admitting: Emergency Medicine

## 2018-08-10 ENCOUNTER — Inpatient Hospital Stay: Payer: BLUE CROSS/BLUE SHIELD

## 2018-08-10 ENCOUNTER — Inpatient Hospital Stay (HOSPITAL_BASED_OUTPATIENT_CLINIC_OR_DEPARTMENT_OTHER): Payer: BLUE CROSS/BLUE SHIELD | Admitting: Hematology

## 2018-08-10 ENCOUNTER — Encounter: Payer: Self-pay | Admitting: Hematology

## 2018-08-10 ENCOUNTER — Other Ambulatory Visit: Payer: Self-pay | Admitting: Hematology

## 2018-08-10 VITALS — BP 126/71 | HR 113 | Temp 98.7°F | Resp 18 | Ht 71.0 in | Wt 193.0 lb

## 2018-08-10 DIAGNOSIS — B370 Candidal stomatitis: Secondary | ICD-10-CM

## 2018-08-10 DIAGNOSIS — C182 Malignant neoplasm of ascending colon: Secondary | ICD-10-CM | POA: Diagnosis present

## 2018-08-10 DIAGNOSIS — Z95828 Presence of other vascular implants and grafts: Secondary | ICD-10-CM | POA: Insufficient documentation

## 2018-08-10 DIAGNOSIS — J90 Pleural effusion, not elsewhere classified: Secondary | ICD-10-CM | POA: Diagnosis not present

## 2018-08-10 DIAGNOSIS — Z9221 Personal history of antineoplastic chemotherapy: Secondary | ICD-10-CM

## 2018-08-10 DIAGNOSIS — C787 Secondary malignant neoplasm of liver and intrahepatic bile duct: Secondary | ICD-10-CM

## 2018-08-10 DIAGNOSIS — Z79899 Other long term (current) drug therapy: Secondary | ICD-10-CM

## 2018-08-10 DIAGNOSIS — K5900 Constipation, unspecified: Secondary | ICD-10-CM | POA: Diagnosis not present

## 2018-08-10 DIAGNOSIS — I10 Essential (primary) hypertension: Secondary | ICD-10-CM

## 2018-08-10 LAB — CMP (CANCER CENTER ONLY)
ALT: 70 U/L — ABNORMAL HIGH (ref 0–44)
ANION GAP: 8 (ref 5–15)
AST: 101 U/L — ABNORMAL HIGH (ref 15–41)
Albumin: 1.6 g/dL — ABNORMAL LOW (ref 3.5–5.0)
Alkaline Phosphatase: 324 U/L — ABNORMAL HIGH (ref 38–126)
BUN: 14 mg/dL (ref 6–20)
CO2: 21 mmol/L — ABNORMAL LOW (ref 22–32)
Calcium: 9.9 mg/dL (ref 8.9–10.3)
Chloride: 106 mmol/L (ref 98–111)
Creatinine: 0.84 mg/dL (ref 0.61–1.24)
GFR, Est AFR Am: >60 mL/min (ref >60)
Glucose, Bld: 94 mg/dL (ref 70–99)
POTASSIUM: 3.7 mmol/L (ref 3.5–5.1)
Sodium: 135 mmol/L (ref 135–145)
TOTAL PROTEIN: 6.3 g/dL — AB (ref 6.5–8.1)
Total Bilirubin: 15.7 mg/dL (ref 0.3–1.2)

## 2018-08-10 LAB — CBC WITH DIFFERENTIAL (CANCER CENTER ONLY)
Abs Immature Granulocytes: 0.76 10*3/uL — ABNORMAL HIGH (ref 0.00–0.07)
BASOS PCT: 0 %
Basophils Absolute: 0 10*3/uL (ref 0.0–0.1)
Eosinophils Absolute: 0.1 10*3/uL (ref 0.0–0.5)
Eosinophils Relative: 1 %
HCT: 26.5 % — ABNORMAL LOW (ref 39.0–52.0)
Hemoglobin: 8.8 g/dL — ABNORMAL LOW (ref 13.0–17.0)
Immature Granulocytes: 5 %
Lymphocytes Relative: 7 %
Lymphs Abs: 1 10*3/uL (ref 0.7–4.0)
MCH: 26.7 pg (ref 26.0–34.0)
MCHC: 33.2 g/dL (ref 30.0–36.0)
MCV: 80.5 fL (ref 80.0–100.0)
MONO ABS: 1.3 10*3/uL — AB (ref 0.1–1.0)
Monocytes Relative: 9 %
NEUTROS ABS: 11.4 10*3/uL — AB (ref 1.7–7.7)
Neutrophils Relative %: 78 %
PLATELETS: 453 10*3/uL — AB (ref 150–400)
RBC: 3.29 MIL/uL — AB (ref 4.22–5.81)
RDW: 17.3 % — AB (ref 11.5–15.5)
WBC: 14.6 10*3/uL — AB (ref 4.0–10.5)
nRBC: 0.5 % — ABNORMAL HIGH (ref 0.0–0.2)

## 2018-08-10 LAB — CEA (IN HOUSE-CHCC): CEA (CHCC-In House): 198.05 ng/mL — ABNORMAL HIGH (ref 0.00–5.00)

## 2018-08-10 MED ORDER — OXYCODONE HCL 5 MG PO TABS: 5.0000 mg | ORAL_TABLET | Freq: Four times a day (QID) | ORAL | 0 refills | Status: DC | PRN

## 2018-08-10 MED ORDER — SODIUM CHLORIDE 0.9% FLUSH
10.0000 mL | Freq: Once | INTRAVENOUS | Status: AC
  Administered 2018-08-10: 10 mL
  Filled 2018-08-10: qty 10

## 2018-08-10 NOTE — Progress Notes (Signed)
[facility]  [phone] Fax:([phone]  Clinic Follow up Note   Patient Care Team: Alroy Dust, L.Marlou Sa, MD as PCP - General (Family Medicine)   Date of Service:  08/10/2018  CC: F/u for metastatic colon cancer   SUMMARY OF ONCOLOGIC HISTORY: Oncology History   Cancer Staging Cancer of right colon [facility]) Staging form: Colon and Rectum, AJCC 8th Edition - Clinical: Stage IVA (cTX, cNX, pM1a) - Signed by Truitt Merle, MD on 08/10/2018       Cancer of right colon ([place])   07/18/2018 Imaging    CT AP IMPRESSION: 1. Acute pancreatitis without necrosis. 2. Eccentric ascending colon wall thickening concerning for primary neoplasm. Multiple hepatic metastasis. Recommend colonoscopy and/or PET-CT on non emergent basis.    07/22/2018 Initial Biopsy    Diagnosis Colon, biopsy, ascending - ADENOCARCINOMA - SEE COMMENT    07/22/2018 Procedure    Impression:  - Preparation of the colon was fair. - Malignant partially obstructing tumor in the ascending colon. Biopsied. Tattooed. - Internal hemorrhoids. - The examined portion of the ileum was normal.     07/23/2018 Imaging    MR ABD MRCP IMPRESSION: 1. There are innumerable peripherally enhancing liver lesions most concerning for diffuse metastatic disease. 2. Mild inflammatory changes of the pancreas compatible with pancreatitis. No complications identified. 3. Ascending colon mass concerning for primary colonic neoplasm. A soft tissue nodule is noted within the right lower quadrant of the abdomen suspicious for ileocolic mesenteric adenopathy. 4. Bilateral pleural effusions.     07/23/2018 Imaging    IMPRESSION: 1. No findings suspicious for metastatic disease in the chest. 2. Small dependent bilateral pleural effusions with associated dependent lower lobe atelectasis. 3. Redemonstration of innumerable hypodense liver masses suspicious for liver metastases.     07/24/2018 Pathology Results   Diagnosis Liver, needle/core biopsy, left lobe mass - METASTATIC ADENOCARCINOMA TO LIVER, CONSISTENT WITH PATIENT'S CLINICAL HISTORY OF PRIMARY COLONIC ADENOCARCINOMA.    07/25/2018 Initial Diagnosis    Cancer of right colon ([place])    07/25/2018 Imaging    IMPRESSION: No scintigraphic evidence of osseous metastases.    08/03/2018 -  Chemotherapy    FOLOFX at 50% dose reduction every 2 weeks. First dose given in hospital on 08/03/18    08/10/2018 Cancer Staging    Staging form: Colon and Rectum, AJCC 8th Edition - Clinical: Stage IVA (cTX, cNX, pM1a) - Signed by Truitt Merle, MD on 08/10/2018    CURRENT THERAPY   FOLFOX every 2 weeks at 50% dose reduction started 08/03/18  INTERVAL HISTORY:  Jeffery Roman is here for a follow up of his metastatic colon cancer. He presents to the clinic today accompanied by his wife. He received his first cycle chemotherapy in the hospital last week, and discharged home 4 days ago.  He notes having dry mouth. He takes nystatin Mouth wash for his thrush which has improved. He has mid abdominal pain for which he takes oxycodone 2-3 times a day and feels his pain is controled. He has LE swelling. He notes urinating but not much bowel movements. He tried stool softener but made him vomit. He is willing to try Miralax. He has ben eating oatmeal for breakfast and solid food or soup for lunch. He notes he tried to drink 40 ounces a day. He notes he is not getting enough sleep. He notes he will walk around the house. Today is his first day out of the house. He only had cold sensitivity to chemo, manageable.  REVIEW OF SYSTEMS:   Constitutional: Denies fevers, chills or abnormal weight loss (+) trouble sleeping (+) low appetite  Eyes: Denies blurriness of vision Ears, nose, mouth, throat, and face: Denies mucositis or sore throat (+) thrush with dry mouth  Respiratory: Denies cough, dyspnea or wheezes Cardiovascular: Denies palpitation, chest discomfort (+) lower  extremity swelling Gastrointestinal:  Denies nausea, heartburn or change in bowel habits (+) upper abdominal pain (+) constipation  Skin: Denies abnormal skin rashes Lymphatics: Denies new lymphadenopathy or easy bruising Neurological:Denies numbness, tingling or new weaknesses Behavioral/Psych: Mood is stable, no new changes  All other systems were reviewed with the patient and are negative.  MEDICAL HISTORY:  Past Medical History:  Diagnosis Date  . Hypertension     SURGICAL HISTORY: Past Surgical History:  Procedure Laterality Date  . BIOPSY  07/22/2018   Procedure: BIOPSY;  Surgeon: Wilford Corner, MD;  Location: [facility];  Service: Endoscopy;;  . COLONOSCOPY WITH PROPOFOL N/A 07/22/2018   Procedure: COLONOSCOPY WITH PROPOFOL;  Surgeon: Wilford Corner, MD;  Location: [facility] END[facility];  Service: Endoscopy;  Laterality: N/A;  . SUBMUCOSAL INJECTION  07/22/2018   Procedure: SUBMUCOSAL INJECTION;  Surgeon: Wilford Corner, MD;  Location: [facility] END[facility];  Service: Endoscopy;;  tattoo    I have reviewed the social history and family history with the patient and they are unchanged from previous note.  ALLERGIES:  is allergic to ciprofloxacin and lisinopril.  MEDICATIONS:  Current Outpatient Medications  Medication Sig Dispense Refill  . amLODipine (NORVASC) 10 MG tablet Take 1 tablet (10 mg total) by mouth daily. 30 tablet 0  . feeding supplement, ENSURE ENLIVE, (ENSURE ENLIVE) LIQD Take 237 mLs by mouth daily. 237 mL 0  . loratadine (CLARITIN) 10 MG tablet Take 10 mg by mouth daily.    . Multiple Vitamin (MULTIVITAMIN WITH MINERALS) TABS tablet Take 1 tablet by mouth daily.    Marland Kitchen nystatin (MYCOSTATIN) 100000 UNIT/ML suspension Take 5 mLs (500,000 Units total) by mouth 4 (four) times daily. 60 mL 0  . omeprazole (PRILOSEC) 20 MG capsule Take 20 mg by mouth daily.    . ondansetron (ZOFRAN) 8 MG tablet Take 1 tablet (8 mg total) by mouth every 8 (eight) hours as needed for  nausea or vomiting. 20 tablet 0  . ondansetron (ZOFRAN-ODT) 8 MG disintegrating tablet Take 1 tablet (8 mg total) by mouth every 8 (eight) hours as needed for nausea or vomiting. 18 tablet 2  . oxyCODONE (OXY IR/ROXICODONE) 5 MG immediate release tablet Take 1 tablet (5 mg total) by mouth every 6 (six) hours as needed for moderate pain. 60 tablet 0  . polyethylene glycol (MIRALAX / GLYCOLAX) packet Take 17 g by mouth daily as needed for moderate constipation. 14 each 0  . prochlorperazine (COMPAZINE) 10 MG tablet Take 1 tablet (10 mg total) by mouth every 6 (six) hours as needed (NAUSEA). 20 tablet 2  . protein supplement shake (PREMIER PROTEIN) LIQD Take 325 mLs (11 oz total) by mouth 2 (two) times daily between meals.  0   No current facility-administered medications for this visit.     PHYSICAL EXAMINATION: ECOG PERFORMANCE STATUS: 3 - Symptomatic, >50% confined to bed  Vitals:   08/10/18 1024  BP: 126/71  Pulse: (!) 113  Resp: 18  Temp: 98.7 F (37.1 C)  SpO2: 97%   Filed Weights   08/10/18 1024  Weight: 193 lb (87.5 kg)    GENERAL:alert, no distress and comfortable SKIN: skin color, texture, turgor are normal, no rashes  or significant lesions EYES: normal, Conjunctiva are pink and non-injected, sclera clear OROPHARYNX:no exudate, no erythema and lips, buccal mucosa, and tongue normal (+) Mild oral thrush NECK: supple, thyroid normal size, non-tender, without nodularity LYMPH:  no palpable lymphadenopathy in the cervical, axillary or inguinal LUNGS: clear to auscultation and percussion with normal breathing effort (+) base of lungs not fully open HEART: regular rate & rhythm and no murmurs (+) b/l lower extremity edema ABDOMEN:abdomen soft, non-tender and normal bowel sounds (+) abdominal bloating (+) RUQ tenderness Musculoskeletal:no cyanosis of digits and no clubbing  NEURO: alert & oriented x 3 with fluent speech, no focal motor/sensory deficits  LABORATORY DATA:  I  have reviewed the data as listed CBC Latest Ref Rng & Units 08/10/2018 08/06/2018 08/05/2018  WBC 4.0 - 10.5 K/uL 14.6(H) 14.5(H) 16.4(H)  Hemoglobin 13.0 - 17.0 g/dL 8.8(L) 8.4(L) 8.1(L)  Hematocrit 39.0 - 52.0 % 26.5(L) 25.8(L) 24.9(L)  Platelets 150 - 400 K/uL 453(H) 399 408(H)     CMP Latest Ref Rng & Units 08/10/2018 08/06/2018 08/05/2018  Glucose 70 - 99 mg/dL 94 97 118(H)  BUN 6 - 20 mg/dL 14 29(H) 34(H)  Creatinine 0.61 - 1.24 mg/dL 0.84 0.90 0.91  Sodium 135 - 145 mmol/L 135 139 137  Potassium 3.5 - 5.1 mmol/L 3.7 4.0 4.2  Chloride 98 - 111 mmol/L 106 109 107  CO2 22 - 32 mmol/L 21(L) 23 21(L)  Calcium 8.9 - 10.3 mg/dL 9.9 9.7 9.6  Total Protein 6.5 - 8.1 g/dL 6.3(L) 6.0(L) 6.2(L)  Total Bilirubin 0.3 - 1.2 mg/dL 15.7(HH) 11.2(H) 9.5(H)  Alkaline Phos 38 - 126 U/L 324(H) 197(H) 164(H)  AST 15 - 41 U/L 101(H) 120(H) 119(H)  ALT 0 - 44 U/L 70(H) 75(H) 69(H)      RADIOGRAPHIC STUDIES: I have personally reviewed the radiological images as listed and agreed with the findings in the report. No results found.   ASSESSMENT & PLAN:  JAKYLAN RON is a 52 y.o. male with history of HTN.   1. Right colon Cancer with diffuse metastasis to liver, stage IV  -I previously reviewed imaging studies and labs with him in person.  Unfortunately his CT scan showed diffuse liver lesions, and liver biopsy confirmed metastasis. -His tumor showed normal expression of MMR, he is not a candidate for immunotherapy.  Foundation one genomic testing has been requested on his tumor, results are pending.  Due to the primary site is in the right colon, he would not benefit much from EGFR inhibitor, even if he has KRAS/NRAS wild type.  Given the rapid progression of his cancer, I suspect that this could be be BRAF mutated tumor. -He has been seen by general surgeon Dr. Tera Helper. He does not have significant bowel obstruction, bleeding, giving his metastatic disease, upfront hemicolectomy is probably not  indicated at this point, I  Agree with Dr Tera Helper.  -He unfortunately has developed rapid deterioration of his liver function, bilirubin went to above 10 with a few weeks of his cancer diagnosis, imaging showed no evidence of biliary dilatation. -He received 1 dose FOLFOX at 50% dose reduction on 08/03/18 due to hyperbilirubinemia secondary to cancer. HE will continue every 2 weeks if he can. I did not think he is a candidate for FOLFOXIRI  --He had PICC line placed.  -Labs reviewed, WBC at 14.6, Hg at 8.8, PLT 455K, ANC 11.4. CMP and CEA are still pending.  -Plan for cycle 2 next week. -I encouraged him to stay active, drink at  least 40 ounces of liquid a day and eat adequately.  -will set up home palliative care  -F/u next week  2. Abdominal pain, Constipation secondary to #1 -On Oxycodone, currently controlling pain.  -I refilled his pain med on 08/10/18 -I enocuregd him to take Miralax for his constipation   3. B/l pleural effusion  -Etiology is unclear, malignant effusion is not ruled out, although he has no evidence of pulmonary metastasis on the scan -Base on lungs are not fully open on today's exam. I encouraged him to practice deep breathing to open lungs.   4. Transaminitis and hyperbilirubinemia -Secondary to diffuse liver metastasis, no evidence of biliary obstruction   5. Hypercalcemia -Secondary to malignancy -improved now after IVF  -07/25/18 bone scan negative for mets -Continue to monitor   6. Oral Thrush -He is on Nystatin Mouthwash, has improved since hospitalization  7. Goal of care discussion  -We again discussed the incurable nature of his cancer, and the overall family poor prognosis,due to his rapid worsening of liver function -The patient understands the goal of care is palliative. -if his liver function continue worsening, will likely switch him to comfort care and hospice   PLAN:  -lab and FOLFOX on 11/20 -F/u with me on 11/22    No  problem-specific Assessment & Plan notes found for this encounter.   No orders of the defined types were placed in this encounter.  All questions were answered. The patient knows to call the clinic with any problems, questions or concerns. No barriers to learning was detected. I spent 20 minutes counseling the patient face to face. The total time spent in the appointment was 25 minutes and more than 50% was on counseling and review of test results     Truitt Merle, MD 08/10/2018   I, Joslyn Devon, am acting as scribe for Truitt Merle, MD.   I have reviewed the above documentation for accuracy and completeness, and I agree with the above.

## 2018-08-11 ENCOUNTER — Ambulatory Visit: Payer: BLUE CROSS/BLUE SHIELD

## 2018-08-14 NOTE — Progress Notes (Signed)
PA for oxycodone has been submitted.

## 2018-08-14 NOTE — Progress Notes (Signed)
Jeffery  [phone] [phone]) [phone]  Clinic Follow up Note   Patient Care Team: Jeffery Roman, Jeffery Sa, MD as PCP - General (Family Medicine)   Date of Service:  08/15/2018  CC: F/u for metastatic colon cancer   SUMMARY OF ONCOLOGIC HISTORY: Oncology History   Cancer Staging Cancer of right colon [facility]) Staging form: Colon and Rectum, AJCC 8th Edition - Clinical: Stage IVA (cTX, cNX, pM1a) - Signed by Jeffery Merle, MD on 08/10/2018       Cancer of right colon ([place])   07/18/2018 Imaging    CT AP IMPRESSION: 1. Acute pancreatitis without necrosis. 2. Eccentric ascending colon wall thickening concerning for primary neoplasm. Multiple hepatic metastasis. Recommend colonoscopy and/or PET-CT on non emergent basis.    07/22/2018 Initial Biopsy    Diagnosis Colon, biopsy, ascending - ADENOCARCINOMA - SEE COMMENT    07/22/2018 Procedure    Impression:  - Preparation of the colon was fair. - Malignant partially obstructing tumor in the ascending colon. Biopsied. Tattooed. - Internal hemorrhoids. - The examined portion of the ileum was normal.     07/23/2018 Imaging    MR ABD MRCP IMPRESSION: 1. There are innumerable peripherally enhancing liver lesions most concerning for diffuse metastatic disease. 2. Mild inflammatory changes of the pancreas compatible with pancreatitis. No complications identified. 3. Ascending colon mass concerning for primary colonic neoplasm. A soft tissue nodule is noted within the right lower quadrant of the abdomen suspicious for ileocolic mesenteric adenopathy. 4. Bilateral pleural effusions.     07/23/2018 Imaging    IMPRESSION: 1. No findings suspicious for metastatic disease in the chest. 2. Small dependent bilateral pleural effusions with associated dependent lower lobe atelectasis. 3. Redemonstration of innumerable hypodense liver masses suspicious for liver metastases.     07/24/2018 Pathology Results   Diagnosis Liver, needle/core biopsy, left lobe mass - METASTATIC ADENOCARCINOMA TO LIVER, CONSISTENT WITH PATIENT'S CLINICAL HISTORY OF PRIMARY COLONIC ADENOCARCINOMA.    07/25/2018 Initial Diagnosis    Cancer of right colon ([place])    07/25/2018 Imaging    IMPRESSION: No scintigraphic evidence of osseous metastases.    08/03/2018 -  Chemotherapy    FOLOFX at 50% dose reduction every 2 weeks. First dose given in hospital on 08/03/18    08/10/2018 Cancer Staging    Staging form: Colon and Rectum, AJCC 8th Edition - Clinical: Stage IVA (cTX, cNX, pM1a) - Signed by Jeffery Merle, MD on 08/10/2018    CURRENT THERAPY   FOLFOX every 2 weeks at 50% dose reduction started 08/03/18, transition to hospice   INTERVAL HISTORY:  Jeffery Roman is here for a follow up of his metastatic colon cancer. He is here with his wife. He remains to be extremely fatigued, spends most time in chair and bed at home, states that he feels nauseated when he eats. He has low appetite and eats little. He noticed bilateral leg edema. He says the his mouth is dry.   REVIEW OF SYSTEMS:   Constitutional: Denies fevers, chills or abnormal weight loss Eyes: Denies blurriness of vision Ears, nose, mouth, throat, and face: Denies mucositis or sore throat (+) dry mouth Respiratory: Denies cough, dyspnea or wheezes Cardiovascular: Denies palpitation, chest discomfort (+) lower extremity swelling Gastrointestinal:  Denies heartburn or change in bowel habits (+) nausea  Skin: Denies abnormal skin rashes Lymphatics: Denies new lymphadenopathy or easy bruising Neurological:Denies numbness, tingling or new weaknesses Behavioral/Psych: Mood is stable, no new changes  All other systems were reviewed with the patient  and are negative.  MEDICAL HISTORY:  Past Medical History:  Diagnosis Date  . Hypertension     SURGICAL HISTORY: Past Surgical History:  Procedure Laterality Date  . BIOPSY  07/22/2018   Procedure: BIOPSY;   Surgeon: Jeffery Corner, MD;  Location: [facility];  Service: Endoscopy;;  . COLONOSCOPY WITH PROPOFOL N/A 07/22/2018   Procedure: COLONOSCOPY WITH PROPOFOL;  Surgeon: Jeffery Corner, MD;  Location: [facility];  Service: Endoscopy;  Laterality: N/A;  . SUBMUCOSAL INJECTION  07/22/2018   Procedure: SUBMUCOSAL INJECTION;  Surgeon: Jeffery Corner, MD;  Location: [facility];  Service: Endoscopy;;  tattoo    I have reviewed the social history and family history with the patient and they are unchanged from previous note.  ALLERGIES:  is allergic to ciprofloxacin and lisinopril.  MEDICATIONS:  Current Outpatient Medications  Medication Sig Dispense Refill  . amLODipine (NORVASC) 10 MG tablet Take 1 tablet (10 mg total) by mouth daily. 30 tablet 0  . dexamethasone (DECADRON) 4 MG tablet Take 1 tablet (4 mg total) by mouth daily. 7 tablet 0  . feeding supplement, ENSURE ENLIVE, (ENSURE ENLIVE) LIQD Take 237 mLs by mouth daily. 237 mL 0  . loratadine (CLARITIN) 10 MG tablet Take 10 mg by mouth daily.    . Multiple Vitamin (MULTIVITAMIN WITH MINERALS) TABS tablet Take 1 tablet by mouth daily.    Marland Kitchen nystatin (MYCOSTATIN) 100000 UNIT/ML suspension Take 5 mLs (500,000 Units total) by mouth 4 (four) times daily. 60 mL 0  . omeprazole (PRILOSEC) 20 MG capsule Take 20 mg by mouth daily.    . ondansetron (ZOFRAN) 8 MG tablet Take 1 tablet (8 mg total) by mouth every 8 (eight) hours as needed for nausea or vomiting. 20 tablet 0  . ondansetron (ZOFRAN-ODT) 8 MG disintegrating tablet Take 1 tablet (8 mg total) by mouth every 8 (eight) hours as needed for nausea or vomiting. 18 tablet 2  . oxyCODONE (OXY IR/ROXICODONE) 5 MG immediate release tablet Take 1 tablet (5 mg total) by mouth every 6 (six) hours as needed for moderate pain. 90 tablet 0  . polyethylene glycol (MIRALAX / GLYCOLAX) packet Take 17 g by mouth daily as needed for moderate constipation. 14 each 0  . prochlorperazine (COMPAZINE) 10  MG tablet Take 1 tablet (10 mg total) by mouth every 6 (six) hours as needed (NAUSEA). 20 tablet 2  . protein supplement shake (PREMIER PROTEIN) LIQD Take 325 mLs (11 oz total) by mouth 2 (two) times daily between meals.  0   No current facility-administered medications for this visit.     PHYSICAL EXAMINATION: ECOG PERFORMANCE STATUS: 3 - Symptomatic, >50% confined to bed Vitals: BP 123/67 Pulse 115 and RR 18 GENERAL: chronic ill and fatigued appearing, drowsy, answer questions appropriately  SKIN: skin color, texture, turgor are normal, no rashes or significant lesions (+) severe jaundice  EYES: normal, Conjunctiva are pink and non-injected, sclera clear OROPHARYNX:no exudate, no erythema and lips, buccal mucosa, and tongue normal  NECK: supple, thyroid normal size, non-tender, without nodularity LYMPH:  no palpable lymphadenopathy in the cervical, axillary or inguinal LUNGS: clear to auscultation and percussion with normal breathing effort  HEART: regular rate & rhythm and no murmurs (+) b/l lower extremity edema ABDOMEN:abdomen soft, non-tender and normal bowel sounds (+) abdominal bloating (+) RUQ tenderness Musculoskeletal:no cyanosis of digits and no clubbing  NEURO: alert & oriented x 3 with fluent speech, no focal motor/sensory deficits  LABORATORY DATA:  I have reviewed the data as listed CBC  Latest Ref Rng & Units 08/15/2018 08/10/2018 08/06/2018  WBC 4.0 - 10.5 K/uL 15.0(H) 14.6(H) 14.5(H)  Hemoglobin 13.0 - 17.0 g/dL 9.1(L) 8.8(L) 8.4(L)  Hematocrit 39.0 - 52.0 % 27.0(L) 26.5(L) 25.8(L)  Platelets 150 - 400 K/uL 355 453(H) 399     CMP Latest Ref Rng & Units 08/15/2018 08/10/2018 08/06/2018  Glucose 70 - 99 mg/dL 83 94 97  BUN 6 - 20 mg/dL 12 14 29(H)  Creatinine 0.61 - 1.24 mg/dL 0.91 0.84 0.90  Sodium 135 - 145 mmol/L 135 135 139  Potassium 3.5 - 5.1 mmol/L 3.9 3.7 4.0  Chloride 98 - 111 mmol/L 103 106 109  CO2 22 - 32 mmol/L 21(L) 21(L) 23  Calcium 8.9 - 10.3  mg/dL 11.3(H) 9.9 9.7  Total Protein 6.5 - 8.1 g/dL 6.5 6.3(L) 6.0(L)  Total Bilirubin 0.3 - 1.2 mg/dL 17.4(HH) 15.7(HH) 11.2(H)  Alkaline Phos 38 - 126 U/L 329(H) 324(H) 197(H)  AST 15 - 41 U/L 126(H) 101(H) 120(H)  ALT 0 - 44 U/L 72(H) 70(H) 75(H)      PROCEDURES  07/22/2018 Colonoscopy Findings: The perianal and digital rectal examinations were normal. A fungating and infiltrative partially obstructing large mass was found in the ascending colon. The mass was partially circumferential (involving two-thirds of the lumen circumference). No bleeding was present. Biopsies were taken with a cold forceps for histology. Estimated blood loss was minimal. Area (distal edge successfully tattooed; unsuccessful adequate tattooing of proximal edge) was tattooed with an injection of 5 mL of [place] ink. Internal hemorrhoids were found during retroflexion. The hemorrhoids were medium-sized and Grade I (internal hemorrhoids that do not prolapse). The terminal ileum appeared normal.  Impression - Preparation of the colon was fair. - Malignant partially obstructing tumor in the ascending colon. Biopsied. Tattooed. - Internal hemorrhoids. - The examined portion of the ileum was normal.  PATHOLOGY  07/24/2018 Surgical Pathology Diagnosis Liver, needle/core biopsy, left lobe mass - METASTATIC ADENOCARCINOMA TO LIVER, CONSISTENT WITH PATIENT'S CLINICAL HISTORY OF PRIMARY COLONIC ADENOCARCINOMA.nformation Specimen(s) Obtained: Liver, needle/core biopsy, left lobe mass Specimen Clinical Information Metastatic colorectal carcinoma [rd] Gross Received in formalin are three fragmented cores of tan [place] and tan white soft tissue ranging from 1.5 to 2.3 cm, submitted entirely in two cassettes. (AK:gt, 07/25/18)   07/22/2018 Surgical Pathology Diagnosis Colon, biopsy, ascending - ADENOCARCINOMA - SEE COMMENT Microscopic Comment Dr. Saralyn Pilar reviewed the case and agrees with the above  diagnosis. Dr. Michail Sermon was paged on July 24, 2018.l Information Specimen(s) Obtained: Colon, biopsy, ascending Specimen Clinical Information ascending colon mass, r/o malignancy (kp) Gross Received in formalin are tan, soft tissue fragments that are submitted in toto. Number: multiple, Size: 0.1 to 0.5 cm, (1 B) ( AK:ah 07/23/18 )    RADIOGRAPHIC STUDIES: I have personally reviewed the radiological images as listed and agreed with the findings in the report. No results found.   07/31/2018 CT AP  IMPRESSION: 1. Mild worsening of diffuse liver metastases and right lower quadrant mesenteric lymphadenopathy. 2. Ascending colon mass as previously described. 3. Nearly completely resolved pancreatic inflammation. 4. Left greater than right basilar lung opacities which may reflect pneumonia or atelectasis. 5. Persistent small volume ascites.  07/25/2018 Bone Scan IMPRESSION: No scintigraphic evidence of osseous metastases.   07/23/2018 CT Chest IMPRESSION: 1. No findings suspicious for metastatic disease in the chest. 2. Small dependent bilateral pleural effusions with associated dependent lower lobe atelectasis. 3. Redemonstration of innumerable hypodense liver masses suspicious for liver metastases.  07/23/2018 MRI Abdomen IMPRESSION: 1. There  are innumerable peripherally enhancing liver lesions most concerning for diffuse metastatic disease. 2. Mild inflammatory changes of the pancreas compatible with pancreatitis. No complications identified. 3. Ascending colon mass concerning for primary colonic neoplasm. A soft tissue nodule is noted within the right lower quadrant of the abdomen suspicious for ileocolic mesenteric adenopathy. 4. Bilateral pleural effusions   07/23/2018 CT AP IMPRESSION: 1. Acute pancreatitis without necrosis. 2. Eccentric ascending colon wall thickening concerning for primary neoplasm. Multiple hepatic metastasis. Recommend colonoscopy  and/or PET-CT on non emergent basis. 3. Acute findings discussed with and reconfirmed by Dr.MOLPUS on 07/18/2018 at 11:42 pm.    ASSESSMENT & PLAN:  Jeffery Roman is a 52 y.o. male with history of HTN.   1. 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  Unfortunately his CT scan showed diffuse liver lesions, and liver biopsy confirmed metastasis. -His tumor showed normal expression of MMR, he is not a candidate for immunotherapy.  -Foundation one genomic testing showed K-ras mutation, no other targetable mutation, discussed with patient and his wife today. --He unfortunately has developed rapid deterioration of his liver function, bilirubin went to above 10 with a few weeks of his cancer diagnosis, imaging showed no evidence of biliary dilatation. -He received 1 dose FOLFOX at 50% dose reduction on 08/03/18 due to hyperbilirubinemia secondary to cancer.  -Labs reviewed, WBC at 15.0, Hg at 9.1, PLT 355K, ANC 11.7. CMP showed Ca 11.3 Albumin 1.7 AST 126 and ALT 72 and ALP 329 Total bilirubin 17.4. -his PS has further decreased, he is extremely weak, and bedbound. -I recommend stopping chemotherapy, due to worsening liver function and performance status. -Unfortunately there is no other effective treatment options for his cancer.  Stenosis is extremely poor, I anticipate his life expectancy will be a few weeks to a month.  -We discussed focusing on comfort care and hospice. I had long conversation with his wife in my office after seeing pt in the infusion room, all questions ansered. Both pt and his wife agreed hospice referral  -F/u next week if needed   2. Abdominal pain, Constipation secondary to #1 -On Oxycodone, currently controlling pain.  -I refilled his pain med on 08/10/18 -I enocuregd him to take Miralax for his constipation   3. B/l pleural effusion  -Etiology is unclear,  malignant effusion is not ruled out, although he has no evidence of pulmonary metastasis on the scan -Base on lungs are not fully open on today's exam. I encouraged him to practice deep breathing to open lungs.   4. Transaminitis and hyperbilirubinemia -Secondary to diffuse liver metastasis, no evidence of biliary obstruction -continue to get worse   5. Hypercalcemia -Secondary to malignancy -improved now after IVF  -07/25/18 bone scan negative for mets -Continue to monitor   6. Oral Thrush -He is on Nystatin Mouthwash, has improved since hospitalization  7. Goal of care discussion  -We again discussed the incurable nature of his cancer, and the overall family poor prognosis,due to his rapid worsening of liver function -The patient understands the goal of care is palliative. -we plan to switch him to comfort care and hospice, he agrees    PLAN:  -stop chemo due to worsening liver function and perform status -I will give IVF today -hospice referral today -I refilled oxycodone  -I will prescribe steroids dexa 2m daily for 7 days for appetite and energy boost  -see me  next week with IVF if pt desires     No problem-specific Assessment & Plan notes found for this encounter.   No orders of the defined types were placed in this encounter.  All questions were answered. The patient knows to call the clinic with any problems, questions or concerns. No barriers to learning was detected. I spent 30 minutes counseling the patient face to face. The total time spent in the appointment was 40 minutes and more than 50% was on counseling and review of test results  I, Noor Dweik am acting as scribe for Dr. Truitt Roman.  I have reviewed the above documentation for accuracy and completeness, and I agree with the above.      Jeffery Merle, MD 08/15/2018

## 2018-08-15 ENCOUNTER — Encounter (HOSPITAL_COMMUNITY): Payer: Self-pay | Admitting: Hematology

## 2018-08-15 ENCOUNTER — Telehealth: Payer: Self-pay

## 2018-08-15 ENCOUNTER — Inpatient Hospital Stay: Payer: BLUE CROSS/BLUE SHIELD

## 2018-08-15 ENCOUNTER — Encounter: Payer: Self-pay | Admitting: Hematology

## 2018-08-15 ENCOUNTER — Inpatient Hospital Stay: Payer: BLUE CROSS/BLUE SHIELD | Admitting: Nutrition

## 2018-08-15 ENCOUNTER — Inpatient Hospital Stay (HOSPITAL_BASED_OUTPATIENT_CLINIC_OR_DEPARTMENT_OTHER): Payer: BLUE CROSS/BLUE SHIELD | Admitting: Hematology

## 2018-08-15 VITALS — BP 123/67 | HR 115 | Temp 98.4°F | Resp 18

## 2018-08-15 DIAGNOSIS — J90 Pleural effusion, not elsewhere classified: Secondary | ICD-10-CM

## 2018-08-15 DIAGNOSIS — C182 Malignant neoplasm of ascending colon: Secondary | ICD-10-CM

## 2018-08-15 DIAGNOSIS — B370 Candidal stomatitis: Secondary | ICD-10-CM

## 2018-08-15 DIAGNOSIS — C787 Secondary malignant neoplasm of liver and intrahepatic bile duct: Secondary | ICD-10-CM | POA: Diagnosis not present

## 2018-08-15 DIAGNOSIS — G893 Neoplasm related pain (acute) (chronic): Secondary | ICD-10-CM | POA: Diagnosis not present

## 2018-08-15 DIAGNOSIS — K5900 Constipation, unspecified: Secondary | ICD-10-CM

## 2018-08-15 DIAGNOSIS — R740 Nonspecific elevation of levels of transaminase and lactic acid dehydrogenase [LDH]: Secondary | ICD-10-CM

## 2018-08-15 LAB — CMP (CANCER CENTER ONLY)
ALBUMIN: 1.7 g/dL — AB (ref 3.5–5.0)
ALK PHOS: 329 U/L — AB (ref 38–126)
ALT: 72 U/L — ABNORMAL HIGH (ref 0–44)
ANION GAP: 11 (ref 5–15)
AST: 126 U/L — ABNORMAL HIGH (ref 15–41)
BUN: 12 mg/dL (ref 6–20)
CALCIUM: 11.3 mg/dL — AB (ref 8.9–10.3)
CHLORIDE: 103 mmol/L (ref 98–111)
CO2: 21 mmol/L — ABNORMAL LOW (ref 22–32)
CREATININE: 0.91 mg/dL (ref 0.61–1.24)
Glucose, Bld: 83 mg/dL (ref 70–99)
Potassium: 3.9 mmol/L (ref 3.5–5.1)
SODIUM: 135 mmol/L (ref 135–145)
Total Bilirubin: 17.4 mg/dL (ref 0.3–1.2)
Total Protein: 6.5 g/dL (ref 6.5–8.1)

## 2018-08-15 LAB — CBC WITH DIFFERENTIAL (CANCER CENTER ONLY)
Abs Immature Granulocytes: 0.17 10*3/uL — ABNORMAL HIGH (ref 0.00–0.07)
BASOS ABS: 0 10*3/uL (ref 0.0–0.1)
Basophils Relative: 0 %
EOS ABS: 0.1 10*3/uL (ref 0.0–0.5)
EOS PCT: 1 %
HCT: 27 % — ABNORMAL LOW (ref 39.0–52.0)
Hemoglobin: 9.1 g/dL — ABNORMAL LOW (ref 13.0–17.0)
Immature Granulocytes: 1 %
Lymphocytes Relative: 9 %
Lymphs Abs: 1.3 10*3/uL (ref 0.7–4.0)
MCH: 26.7 pg (ref 26.0–34.0)
MCHC: 33.7 g/dL (ref 30.0–36.0)
MCV: 79.2 fL — ABNORMAL LOW (ref 80.0–100.0)
Monocytes Absolute: 1.7 10*3/uL — ABNORMAL HIGH (ref 0.1–1.0)
Monocytes Relative: 11 %
NEUTROS PCT: 78 %
NRBC: 0.3 % — AB (ref 0.0–0.2)
Neutro Abs: 11.7 10*3/uL — ABNORMAL HIGH (ref 1.7–7.7)
PLATELETS: 355 10*3/uL (ref 150–400)
RBC: 3.41 MIL/uL — AB (ref 4.22–5.81)
RDW: 19.4 % — AB (ref 11.5–15.5)
WBC: 15 10*3/uL — AB (ref 4.0–10.5)

## 2018-08-15 MED ORDER — SODIUM CHLORIDE 0.9 % IV SOLN
INTRAVENOUS | Status: AC
  Administered 2018-08-15: 10:00:00 via INTRAVENOUS
  Filled 2018-08-15 (×2): qty 250

## 2018-08-15 MED ORDER — DEXAMETHASONE 4 MG PO TABS: 4.0000 mg | ORAL_TABLET | Freq: Every day | ORAL | 0 refills | Status: AC

## 2018-08-15 MED ORDER — OXYCODONE HCL 5 MG PO TABS: 5.0000 mg | ORAL_TABLET | Freq: Four times a day (QID) | ORAL | 0 refills | Status: AC | PRN

## 2018-08-15 NOTE — Telephone Encounter (Signed)
Faxed urgent referral to [facility] to [phone] with OV note and demographics, called office to let them know sending.   Left voice message for patient's wife Santiago Glad with above information, also Dr. Burr Medico sent in steroid and refill on pain medication. Scheduling will be calling with the appointment for fluids.

## 2018-08-15 NOTE — Progress Notes (Signed)
Patient was identified to be at risk for malnutrition secondary to poor appetite and weight loss.  Patient is a 52 year old male diagnosed with metastatic colon cancer.  He is a patient of Dr. Burr Medico.  Past medical history includes hypertension.  Medications include multivitamin, Zofran, MiraLAX, and Senokot-S.  Labs were reviewed.  Height: 5 feet 11 inches. Weight: 193 pounds November 15.  This reflects lower extremity edema. BMI: 26.92.  Patient reports he is trying to eat smaller meals more often. He drinks Ensure and Premier protein when able. He is following a low fiber diet. He is concerned with his lower leg swelling.   Reports MiraLAX caused vomiting.   He is trying to drink adequate water. Patient declines nutrition focused physical exam.  I was unable to obtain a good diet history. Patient has rapid worsening of liver function, which if continues, patient will switch care to comfort care and hospice.  Expect protein calorie malnutrition.  Encouraged adequate protein intake.  Recommended patient continue protein shakes as tolerated.  Provided coupons for Ensure and my contact information for questions.  Patient will call me if he has any questions or concerns.  **Disclaimer: This note was dictated with voice recognition software. Similar sounding words can inadvertently be transcribed and this note may contain transcription errors which may not have been corrected upon publication of note.**

## 2018-08-15 NOTE — Patient Instructions (Signed)
[facility] Discharge Instructions for Patients Receiving Chemotherapy  Today you received the following chemotherapy agents Irinotecan, Oxaliplatin, Leucovorin, Fluorouracil  To help prevent nausea and vomiting after your treatment, we encourage you to take your nausea medication as prescribed.   If you develop nausea and vomiting that is not controlled by your nausea medication, call the clinic.   BELOW ARE SYMPTOMS THAT SHOULD BE REPORTED IMMEDIATELY:  *FEVER GREATER THAN 100.5 F  *CHILLS WITH OR WITHOUT FEVER  NAUSEA AND VOMITING THAT IS NOT CONTROLLED WITH YOUR NAUSEA MEDICATION  *UNUSUAL SHORTNESS OF BREATH  *UNUSUAL BRUISING OR BLEEDING  TENDERNESS IN MOUTH AND THROAT WITH OR WITHOUT PRESENCE OF ULCERS  *URINARY PROBLEMS  *BOWEL PROBLEMS  UNUSUAL RASH Items with * indicate a potential emergency and should be followed up as soon as possible.  Feel free to call the clinic should you have any questions or concerns. The clinic phone number is [phone].  Please show the [facility] at check-in to the Emergency Department and triage nurse.   Oxaliplatin Injection What is this medicine? OXALIPLATIN (ox AL i PLA tin) is a chemotherapy drug. It targets fast dividing cells, like cancer cells, and causes these cells to die. This medicine is used to treat cancers of the colon and rectum, and many other cancers. This medicine may be used for other purposes; ask your health care provider or pharmacist if you have questions. COMMON BRAND NAME(S): Eloxatin What should I tell my health care provider before I take this medicine? They need to know if you have any of these conditions: -kidney disease -an unusual or allergic reaction to oxaliplatin, other chemotherapy, other medicines, foods, dyes, or preservatives -pregnant or trying to get pregnant -breast-feeding How should I use this medicine? This drug is given as an infusion into a vein. It is  administered in a hospital or clinic by a specially trained health care professional. Talk to your pediatrician regarding the use of this medicine in children. Special care may be needed. Overdosage: If you think you have taken too much of this medicine contact a poison control center or emergency room at once. NOTE: This medicine is only for you. Do not share this medicine with others. What if I miss a dose? It is important not to miss a dose. Call your doctor or health care professional if you are unable to keep an appointment. What may interact with this medicine? -medicines to increase blood counts like filgrastim, pegfilgrastim, sargramostim -probenecid -some antibiotics like amikacin, gentamicin, neomycin, polymyxin B, streptomycin, tobramycin -zalcitabine Talk to your doctor or health care professional before taking any of these medicines: -acetaminophen -aspirin -ibuprofen -ketoprofen -naproxen This list may not describe all possible interactions. Give your health care provider a list of all the medicines, herbs, non-prescription drugs, or dietary supplements you use. Also tell them if you smoke, drink alcohol, or use illegal drugs. Some items may interact with your medicine. What should I watch for while using this medicine? Your condition will be monitored carefully while you are receiving this medicine. You will need important blood work done while you are taking this medicine. This medicine can make you more sensitive to cold. Do not drink cold drinks or use ice. Cover exposed skin before coming in contact with cold temperatures or cold objects. When out in cold weather wear warm clothing and cover your mouth and nose to warm the air that goes into your lungs. Tell your doctor if you get sensitive to the  cold. This drug may make you feel generally unwell. This is not uncommon, as chemotherapy can affect healthy cells as well as cancer cells. Report any side effects. Continue your  course of treatment even though you feel ill unless your doctor tells you to stop. In some cases, you may be given additional medicines to help with side effects. Follow all directions for their use. Call your doctor or health care professional for advice if you get a fever, chills or sore throat, or other symptoms of a cold or flu. Do not treat yourself. This drug decreases your body's ability to fight infections. Try to avoid being around people who are sick. This medicine may increase your risk to bruise or bleed. Call your doctor or health care professional if you notice any unusual bleeding. Be careful brushing and flossing your teeth or using a toothpick because you may get an infection or bleed more easily. If you have any dental work done, tell your dentist you are receiving this medicine. Avoid taking products that contain aspirin, acetaminophen, ibuprofen, naproxen, or ketoprofen unless instructed by your doctor. These medicines may hide a fever. Do not become pregnant while taking this medicine. Women should inform their doctor if they wish to become pregnant or think they might be pregnant. There is a potential for serious side effects to an unborn child. Talk to your health care professional or pharmacist for more information. Do not breast-feed an infant while taking this medicine. Call your doctor or health care professional if you get diarrhea. Do not treat yourself. What side effects may I notice from receiving this medicine? Side effects that you should report to your doctor or health care professional as soon as possible: -allergic reactions like skin rash, itching or hives, swelling of the face, lips, or tongue -low blood counts - This drug may decrease the number of white blood cells, red blood cells and platelets. You may be at increased risk for infections and bleeding. -signs of infection - fever or chills, cough, sore throat, pain or difficulty passing urine -signs of decreased  platelets or bleeding - bruising, pinpoint red spots on the skin, black, tarry stools, nosebleeds -signs of decreased red blood cells - unusually weak or tired, fainting spells, lightheadedness -breathing problems -chest pain, pressure -cough -diarrhea -jaw tightness -mouth sores -nausea and vomiting -pain, swelling, redness or irritation at the injection site -pain, tingling, numbness in the hands or feet -problems with balance, talking, walking -redness, blistering, peeling or loosening of the skin, including inside the mouth -trouble passing urine or change in the amount of urine Side effects that usually do not require medical attention (report to your doctor or health care professional if they continue or are bothersome): -changes in vision -constipation -hair loss -loss of appetite -metallic taste in the mouth or changes in taste -stomach pain This list may not describe all possible side effects. Call your doctor for medical advice about side effects. You may report side effects to FDA at 1-800-FDA-1088. Where should I keep my medicine? This drug is given in a hospital or clinic and will not be stored at home. NOTE: This sheet is a summary. It may not cover all possible information. If you have questions about this medicine, talk to your doctor, pharmacist, or health care provider.  2018 Elsevier/Gold Standard (2008-04-08 17:22:47)  Irinotecan injection What is this medicine? IRINOTECAN (ir in oh TEE kan ) is a chemotherapy drug. It is used to treat colon and rectal cancer. This medicine  may be used for other purposes; ask your health care provider or pharmacist if you have questions. COMMON BRAND NAME(S): Camptosar What should I tell my health care provider before I take this medicine? They need to know if you have any of these conditions: -blood disorders -dehydration -diarrhea -infection (especially a virus infection such as chickenpox, cold sores, or herpes) -liver  disease -low blood counts, like low white cell, platelet, or red cell counts -recent or ongoing radiation therapy -an unusual or allergic reaction to irinotecan, sorbitol, other chemotherapy, other medicines, foods, dyes, or preservatives -pregnant or trying to get pregnant -breast-feeding How should I use this medicine? This drug is given as an infusion into a vein. It is administered in a hospital or clinic by a specially trained health care professional. Talk to your pediatrician regarding the use of this medicine in children. Special care may be needed. Overdosage: If you think you have taken too much of this medicine contact a poison control center or emergency room at once. NOTE: This medicine is only for you. Do not share this medicine with others. What if I miss a dose? It is important not to miss your dose. Call your doctor or health care professional if you are unable to keep an appointment. What may interact with this medicine? Do not take this medicine with any of the following medications: -atazanavir -certain medicines for fungal infections like itraconazole and ketoconazole -St. John's Wort This medicine may also interact with the following medications: -dexamethasone -diuretics -laxatives -medicines for seizures like carbamazepine, mephobarbital, phenobarbital, phenytoin, primidone -medicines to increase blood counts like filgrastim, pegfilgrastim, sargramostim -prochlorperazine -vaccines This list may not describe all possible interactions. Give your health care provider a list of all the medicines, herbs, non-prescription drugs, or dietary supplements you use. Also tell them if you smoke, drink alcohol, or use illegal drugs. Some items may interact with your medicine. What should I watch for while using this medicine? Your condition will be monitored carefully while you are receiving this medicine. You will need important blood work done while you are taking this  medicine. This drug may make you feel generally unwell. This is not uncommon, as chemotherapy can affect healthy cells as well as cancer cells. Report any side effects. Continue your course of treatment even though you feel ill unless your doctor tells you to stop. In some cases, you may be given additional medicines to help with side effects. Follow all directions for their use. You may get drowsy or dizzy. Do not drive, use machinery, or do anything that needs mental alertness until you know how this medicine affects you. Do not stand or sit up quickly, especially if you are an older patient. This reduces the risk of dizzy or fainting spells. Call your doctor or health care professional for advice if you get a fever, chills or sore throat, or other symptoms of a cold or flu. Do not treat yourself. This drug decreases your body's ability to fight infections. Try to avoid being around people who are sick. This medicine may increase your risk to bruise or bleed. Call your doctor or health care professional if you notice any unusual bleeding. Be careful brushing and flossing your teeth or using a toothpick because you may get an infection or bleed more easily. If you have any dental work done, tell your dentist you are receiving this medicine. Avoid taking products that contain aspirin, acetaminophen, ibuprofen, naproxen, or ketoprofen unless instructed by your doctor. These medicines may hide  a fever. Do not become pregnant while taking this medicine. Women should inform their doctor if they wish to become pregnant or think they might be pregnant. There is a potential for serious side effects to an unborn child. Talk to your health care professional or pharmacist for more information. Do not breast-feed an infant while taking this medicine. What side effects may I notice from receiving this medicine? Side effects that you should report to your doctor or health care professional as soon as  possible: -allergic reactions like skin rash, itching or hives, swelling of the face, lips, or tongue -low blood counts - this medicine may decrease the number of white blood cells, red blood cells and platelets. You may be at increased risk for infections and bleeding. -signs of infection - fever or chills, cough, sore throat, pain or difficulty passing urine -signs of decreased platelets or bleeding - bruising, pinpoint red spots on the skin, black, tarry stools, blood in the urine -signs of decreased red blood cells - unusually weak or tired, fainting spells, lightheadedness -breathing problems -chest pain -diarrhea -feeling faint or lightheaded, falls -flushing, runny nose, sweating during infusion -mouth sores or pain -pain, swelling, redness or irritation where injected -pain, swelling, warmth in the leg -pain, tingling, numbness in the hands or feet -problems with balance, talking, walking -stomach cramps, pain -trouble passing urine or change in the amount of urine -vomiting as to be unable to hold down drinks or food -yellowing of the eyes or skin Side effects that usually do not require medical attention (report to your doctor or health care professional if they continue or are bothersome): -constipation -hair loss -headache -loss of appetite -nausea, vomiting -stomach upset This list may not describe all possible side effects. Call your doctor for medical advice about side effects. You may report side effects to FDA at 1-800-FDA-1088. Where should I keep my medicine? This drug is given in a hospital or clinic and will not be stored at home. NOTE: This sheet is a summary. It may not cover all possible information. If you have questions about this medicine, talk to your doctor, pharmacist, or health care provider.  2018 Elsevier/Gold Standard (2013-03-11 16:29:32)   Leucovorin injection What is this medicine? LEUCOVORIN (loo koe VOR in) is used to prevent or treat the  harmful effects of some medicines. This medicine is used to treat anemia caused by a low amount of folic acid in the body. It is also used with 5-fluorouracil (5-FU) to treat colon cancer. This medicine may be used for other purposes; ask your health care provider or pharmacist if you have questions. What should I tell my health care provider before I take this medicine? They need to know if you have any of these conditions: -anemia from low levels of vitamin B-12 in the blood -an unusual or allergic reaction to leucovorin, folic acid, other medicines, foods, dyes, or preservatives -pregnant or trying to get pregnant -breast-feeding How should I use this medicine? This medicine is for injection into a muscle or into a vein. It is given by a health care professional in a hospital or clinic setting. Talk to your pediatrician regarding the use of this medicine in children. Special care may be needed. Overdosage: If you think you have taken too much of this medicine contact a poison control center or emergency room at once. NOTE: This medicine is only for you. Do not share this medicine with others. What if I miss a dose? This does not apply.  What may interact with this medicine? -capecitabine -fluorouracil -phenobarbital -phenytoin -primidone -trimethoprim-sulfamethoxazole This list may not describe all possible interactions. Give your health care provider a list of all the medicines, herbs, non-prescription drugs, or dietary supplements you use. Also tell them if you smoke, drink alcohol, or use illegal drugs. Some items may interact with your medicine. What should I watch for while using this medicine? Your condition will be monitored carefully while you are receiving this medicine. This medicine may increase the side effects of 5-fluorouracil, 5-FU. Tell your doctor or health care professional if you have diarrhea or mouth sores that do not get better or that get worse. What side effects  may I notice from receiving this medicine? Side effects that you should report to your doctor or health care professional as soon as possible: -allergic reactions like skin rash, itching or hives, swelling of the face, lips, or tongue -breathing problems -fever, infection -mouth sores -unusual bleeding or bruising -unusually weak or tired Side effects that usually do not require medical attention (report to your doctor or health care professional if they continue or are bothersome): -constipation or diarrhea -loss of appetite -nausea, vomiting This list may not describe all possible side effects. Call your doctor for medical advice about side effects. You may report side effects to FDA at 1-800-FDA-1088. Where should I keep my medicine? This drug is given in a hospital or clinic and will not be stored at home. NOTE: This sheet is a summary. It may not cover all possible information. If you have questions about this medicine, talk to your doctor, pharmacist, or health care provider.  2018 Else[employer]/Gold Standard (2008-03-18 16:50:29)    Fluorouracil, 5-FU injection What is this medicine? FLUOROURACIL, 5-FU (flure oh YOOR a sil) is a chemotherapy drug. It slows the growth of cancer cells. This medicine is used to treat many types of cancer like breast cancer, colon or rectal cancer, pancreatic cancer, and stomach cancer. This medicine may be used for other purposes; ask your health care provider or pharmacist if you have questions. COMMON BRAND NAME(S): Adrucil What should I tell my health care provider before I take this medicine? They need to know if you have any of these conditions: -blood disorders -dihydropyrimidine dehydrogenase (DPD) deficiency -infection (especially a virus infection such as chickenpox, cold sores, or herpes) -kidney disease -liver disease -malnourished, poor nutrition -recent or ongoing radiation therapy -an unusual or allergic reaction to fluorouracil, other  chemotherapy, other medicines, foods, dyes, or preservatives -pregnant or trying to get pregnant -breast-feeding How should I use this medicine? This drug is given as an infusion or injection into a vein. It is administered in a hospital or clinic by a specially trained health care professional. Talk to your pediatrician regarding the use of this medicine in children. Special care may be needed. Overdosage: If you think you have taken too much of this medicine contact a poison control center or emergency room at once. NOTE: This medicine is only for you. Do not share this medicine with others. What if I miss a dose? It is important not to miss your dose. Call your doctor or health care professional if you are unable to keep an appointment. What may interact with this medicine? -allopurinol -cimetidine -dapsone -digoxin -hydroxyurea -leucovorin -levamisole -medicines for seizures like ethotoin, fosphenytoin, phenytoin -medicines to increase blood counts like filgrastim, pegfilgrastim, sargramostim -medicines that treat or prevent blood clots like warfarin, enoxaparin, and dalteparin -methotrexate -metronidazole -pyrimethamine -some other chemotherapy drugs like busulfan, cisplatin,  estramustine, vinblastine -trimethoprim -trimetrexate -vaccines Talk to your doctor or health care professional before taking any of these medicines: -acetaminophen -aspirin -ibuprofen -ketoprofen -naproxen This list may not describe all possible interactions. Give your health care provider a list of all the medicines, herbs, non-prescription drugs, or dietary supplements you use. Also tell them if you smoke, drink alcohol, or use illegal drugs. Some items may interact with your medicine. What should I watch for while using this medicine? Visit your doctor for checks on your progress. This drug may make you feel generally unwell. This is not uncommon, as chemotherapy can affect healthy cells as well as  cancer cells. Report any side effects. Continue your course of treatment even though you feel ill unless your doctor tells you to stop. In some cases, you may be given additional medicines to help with side effects. Follow all directions for their use. Call your doctor or health care professional for advice if you get a fever, chills or sore throat, or other symptoms of a cold or flu. Do not treat yourself. This drug decreases your body's ability to fight infections. Try to avoid being around people who are sick. This medicine may increase your risk to bruise or bleed. Call your doctor or health care professional if you notice any unusual bleeding. Be careful brushing and flossing your teeth or using a toothpick because you may get an infection or bleed more easily. If you have any dental work done, tell your dentist you are receiving this medicine. Avoid taking products that contain aspirin, acetaminophen, ibuprofen, naproxen, or ketoprofen unless instructed by your doctor. These medicines may hide a fever. Do not become pregnant while taking this medicine. Women should inform their doctor if they wish to become pregnant or think they might be pregnant. There is a potential for serious side effects to an unborn child. Talk to your health care professional or pharmacist for more information. Do not breast-feed an infant while taking this medicine. Men should inform their doctor if they wish to father a child. This medicine may lower sperm counts. Do not treat diarrhea with over the counter products. Contact your doctor if you have diarrhea that lasts more than 2 days or if it is severe and watery. This medicine can make you more sensitive to the sun. Keep out of the sun. If you cannot avoid being in the sun, wear protective clothing and use sunscreen. Do not use sun lamps or tanning beds/booths. What side effects may I notice from receiving this medicine? Side effects that you should report to your doctor  or health care professional as soon as possible: -allergic reactions like skin rash, itching or hives, swelling of the face, lips, or tongue -low blood counts - this medicine may decrease the number of white blood cells, red blood cells and platelets. You may be at increased risk for infections and bleeding. -signs of infection - fever or chills, cough, sore throat, pain or difficulty passing urine -signs of decreased platelets or bleeding - bruising, pinpoint red spots on the skin, black, tarry stools, blood in the urine -signs of decreased red blood cells - unusually weak or tired, fainting spells, lightheadedness -breathing problems -changes in vision -chest pain -mouth sores -nausea and vomiting -pain, swelling, redness at site where injected -pain, tingling, numbness in the hands or feet -redness, swelling, or sores on hands or feet -stomach pain -unusual bleeding Side effects that usually do not require medical attention (report to your doctor or health care professional  if they continue or are bothersome): -changes in finger or toe nails -diarrhea -dry or itchy skin -hair loss -headache -loss of appetite -sensitivity of eyes to the light -stomach upset -unusually teary eyes This list may not describe all possible side effects. Call your doctor for medical advice about side effects. You may report side effects to FDA at 1-800-FDA-1088. Where should I keep my medicine? This drug is given in a hospital or clinic and will not be stored at home. NOTE: This sheet is a summary. It may not cover all possible information. If you have questions about this medicine, talk to your doctor, pharmacist, or health care provider.  2018 [employer]/Gold Standard (2008-01-16 13:53:16)

## 2018-08-16 ENCOUNTER — Telehealth: Payer: Self-pay | Admitting: Hematology

## 2018-08-16 NOTE — Telephone Encounter (Signed)
Scheduled appt per 11/20 sch message - unable to leave message for patient - pt to get an updated schedule when he comes in for pump d/c

## 2018-08-17 ENCOUNTER — Inpatient Hospital Stay: Payer: BLUE CROSS/BLUE SHIELD

## 2018-08-18 NOTE — Progress Notes (Signed)
Jeffery Roman  [phone] [phone]) [phone]  Clinic Follow up Note   Patient Care Team: Alroy Dust, L.Marlou Sa, MD as PCP - General (Family Medicine) 08/20/2018   Chief Complaint: F/u on colon cancer  SUMMARY OF ONCOLOGIC HISTORY: Oncology History   Cancer Staging Cancer of right colon [facility]) Staging form: Colon and Rectum, AJCC 8th Edition - Clinical: Stage IVA (cTX, cNX, pM1a) - Signed by Truitt Merle, MD on 08/10/2018       Cancer of right colon ([place])   07/18/2018 Imaging    CT AP IMPRESSION: 1. Acute pancreatitis without necrosis. 2. Eccentric ascending colon wall thickening concerning for primary neoplasm. Multiple hepatic metastasis. Recommend colonoscopy and/or PET-CT on non emergent basis.    07/22/2018 Initial Biopsy    Diagnosis Colon, biopsy, ascending - ADENOCARCINOMA - SEE COMMENT    07/22/2018 Procedure    Impression:  - Preparation of the colon was fair. - Malignant partially obstructing tumor in the ascending colon. Biopsied. Tattooed. - Internal hemorrhoids. - The examined portion of the ileum was normal.     07/23/2018 Imaging    MR ABD MRCP IMPRESSION: 1. There are innumerable peripherally enhancing liver lesions most concerning for diffuse metastatic disease. 2. Mild inflammatory changes of the pancreas compatible with pancreatitis. No complications identified. 3. Ascending colon mass concerning for primary colonic neoplasm. A soft tissue nodule is noted within the right lower quadrant of the abdomen suspicious for ileocolic mesenteric adenopathy. 4. Bilateral pleural effusions.     07/23/2018 Imaging    IMPRESSION: 1. No findings suspicious for metastatic disease in the chest. 2. Small dependent bilateral pleural effusions with associated dependent lower lobe atelectasis. 3. Redemonstration of innumerable hypodense liver masses suspicious for liver metastases.     07/24/2018 Pathology Results    Diagnosis Liver,  needle/core biopsy, left lobe mass - METASTATIC ADENOCARCINOMA TO LIVER, CONSISTENT WITH PATIENT'S CLINICAL HISTORY OF PRIMARY COLONIC ADENOCARCINOMA.    07/25/2018 Initial Diagnosis    Cancer of right colon ([place])    07/25/2018 Imaging    IMPRESSION: No scintigraphic evidence of osseous metastases.    08/03/2018 -  Chemotherapy    FOLOFX at 50% dose reduction every 2 weeks. First dose given in hospital on 08/03/18    08/10/2018 Cancer Staging    Staging form: Colon and Rectum, AJCC 8th Edition - Clinical: Stage IVA (cTX, cNX, pM1a) - Signed by Truitt Merle, MD on 08/10/2018    CURRENT THERAPY Hospice/Comfort care  INTERVAL HISTORY: Jeffery Roman is a 52 y.o. male who is here for follow-up. He is here with [name]. He came in a wheelchair, appears to be lethargic.  He is arousable, answer some simple questions, and his speech is slurred.  He has been doing poorly at home since last visit,  he cannot swallow his medications. He is nauseated, and vomited his pain medication yesterday. He is constipated and has not had a BM in 3-4 days. He tried Miralax with no relief. His appetite is diminished, he has been eating and drinking very little.  He urinates a few times a day. He recently fell while trying to get up.  Pertinent positives and negatives of review of systems are listed and detailed within the above HPI.   REVIEW OF SYSTEMS:   Constitutional: Denies fevers, chills or abnormal weight loss (+) loss of appetite  Eyes: Denies blurriness of vision Ears, nose, mouth, throat, and face: Denies mucositis or sore throat Respiratory: Denies cough, dyspnea or wheezes Cardiovascular: Denies palpitation,  chest discomfort or lower extremity swelling Gastrointestinal:  Denies heartburn (+) dysphagia (+) nausea (+) constipation Skin: Denies abnormal skin rashes Lymphatics: Denies new lymphadenopathy or easy bruising Neurological:Denies numbness, tingling or new weaknesses Behavioral/Psych:  Mood is stable, no new changes  All other systems were reviewed with the patient and are negative.  MEDICAL HISTORY:  Past Medical History:  Diagnosis Date  . Hypertension     SURGICAL HISTORY: Past Surgical History:  Procedure Laterality Date  . BIOPSY  07/22/2018   Procedure: BIOPSY;  Surgeon: Wilford Corner, MD;  Location: [facility];  Service: Endoscopy;;  . COLONOSCOPY WITH PROPOFOL N/A 07/22/2018   Procedure: COLONOSCOPY WITH PROPOFOL;  Surgeon: Wilford Corner, MD;  Location: [facility];  Service: Endoscopy;  Laterality: N/A;  . SUBMUCOSAL INJECTION  07/22/2018   Procedure: SUBMUCOSAL INJECTION;  Surgeon: Wilford Corner, MD;  Location: [facility];  Service: Endoscopy;;  tattoo    I have reviewed the social history and family history with the patient and they are unchanged from previous note.  ALLERGIES:  is allergic to ciprofloxacin and lisinopril.  MEDICATIONS:  Current Outpatient Medications  Medication Sig Dispense Refill  . amLODipine (NORVASC) 10 MG tablet Take 1 tablet (10 mg total) by mouth daily. 30 tablet 0  . dexamethasone (DECADRON) 4 MG tablet Take 1 tablet (4 mg total) by mouth daily. 7 tablet 0  . feeding supplement, ENSURE ENLIVE, (ENSURE ENLIVE) LIQD Take 237 mLs by mouth daily. 237 mL 0  . loratadine (CLARITIN) 10 MG tablet Take 10 mg by mouth daily.    . Multiple Vitamin (MULTIVITAMIN WITH MINERALS) TABS tablet Take 1 tablet by mouth daily.    Marland Kitchen nystatin (MYCOSTATIN) 100000 UNIT/ML suspension Take 5 mLs (500,000 Units total) by mouth 4 (four) times daily. 60 mL 0  . omeprazole (PRILOSEC) 20 MG capsule Take 20 mg by mouth daily.    . ondansetron (ZOFRAN) 8 MG tablet Take 1 tablet (8 mg total) by mouth every 8 (eight) hours as needed for nausea or vomiting. 20 tablet 0  . ondansetron (ZOFRAN-ODT) 8 MG disintegrating tablet Take 1 tablet (8 mg total) by mouth every 8 (eight) hours as needed for nausea or vomiting. 18 tablet 2  . oxyCODONE (OXY  IR/ROXICODONE) 5 MG immediate release tablet Take 1 tablet (5 mg total) by mouth every 6 (six) hours as needed for moderate pain. (Patient taking differently: Take 10 mg by mouth every 6 (six) hours as needed for moderate pain. ) 90 tablet 0  . polyethylene glycol (MIRALAX / GLYCOLAX) packet Take 17 g by mouth daily as needed for moderate constipation. 14 each 0  . prochlorperazine (COMPAZINE) 10 MG tablet Take 1 tablet (10 mg total) by mouth every 6 (six) hours as needed (NAUSEA). 20 tablet 2  . protein supplement shake (PREMIER PROTEIN) LIQD Take 325 mLs (11 oz total) by mouth 2 (two) times daily between meals.  0   No current facility-administered medications for this visit.     PHYSICAL EXAMINATION: ECOG PERFORMANCE STATUS: 4 - Bedbound  Vitals:   08/20/18 1254  BP: 126/72  Pulse: (!) 125  Resp: 17  Temp: 98.5 F (36.9 C)  SpO2: 96%   Filed Weights   08/20/18 1254  Weight: 180 lb 11.2 oz (82 kg)    GENERAL: Lethargic (+) on wheelchair SKIN: skin color, texture, turgor are normal, no rashes or significant lesions (+) pale/jaundiced  EYES: normal, Conjunctiva are pink and non-injected, sclera clear OROPHARYNX:no exudate, no erythema and lips, buccal mucosa,  and tongue normal  NECK: supple, thyroid normal size, non-tender, without nodularity LYMPH:  no palpable lymphadenopathy in the cervical, axillary or inguinal LUNGS: clear to auscultation and percussion with normal breathing effort HEART: regular rate & rhythm and no murmurs and no lower extremity edema ABDOMEN:abdomen soft, (+) tenderness in the right upper quadrant Musculoskeletal:no cyanosis of digits and no clubbing  NEURO: alert & oriented x 3 with fluent speech, no focal motor/sensory deficits  LABORATORY DATA:  I have reviewed the data as listed CBC Latest Ref Rng & Units 08/20/2018 08/15/2018 08/10/2018  WBC 4.0 - 10.5 K/uL 11.2(H) 15.0(H) 14.6(H)  Hemoglobin 13.0 - 17.0 g/dL 9.2(L) 9.1(L) 8.8(L)  Hematocrit  39.0 - 52.0 % 28.3(L) 27.0(L) 26.5(L)  Platelets 150 - 400 K/uL 322 355 453(H)     CMP Latest Ref Rng & Units 08/20/2018 08/15/2018 08/10/2018  Glucose 70 - 99 mg/dL 44(LL) 83 94  BUN 6 - 20 mg/dL 27(H) 12 14  Creatinine 0.61 - 1.24 mg/dL 1.18 0.91 0.84  Sodium 135 - 145 mmol/L 141 135 135  Potassium 3.5 - 5.1 mmol/L 4.3 3.9 3.7  Chloride 98 - 111 mmol/L 105 103 106  CO2 22 - 32 mmol/L 22 21(L) 21(L)  Calcium 8.9 - 10.3 mg/dL 13.2(HH) 11.3(H) 9.9  Total Protein 6.5 - 8.1 g/dL 6.5 6.5 6.3(L)  Total Bilirubin 0.3 - 1.2 mg/dL 17.8(HH) 17.4(HH) 15.7(HH)  Alkaline Phos 38 - 126 U/L 309(H) 329(H) 324(H)  AST 15 - 41 U/L 246(HH) 126(H) 101(H)  ALT 0 - 44 U/L 97(H) 72(H) 70(H)   Tumor Markers CEA 08/10/2018: 198.05  RADIOGRAPHIC STUDIES: I have personally reviewed the radiological images as listed and agreed with the findings in the report. No results found.   ASSESSMENT & PLAN:  Jeffery Roman is a 52 y.o. male with history of  1. 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  He received 1 FOLFOX with 50% dose reduction in the hospital 2 weeks ago. Unfortunately, due to his poor performance in the liver failure, chemotherapy was stopped, and he was enrolled to hospice last week -Labs reviewed, CBC showed Hg 9.2 WBC 11.2.  CMP showed significant hypercalcemia, hyperglycemia.  -Patient is clearly deteriorating rapidly, likely will pass away in the next few days to a week.  Due to his extremely poor performance status and multiple symptoms, I recommend inpatient hospice.  Patient and his wife agreed.  I spoke with his hospice nurse, and arranged admission for today.  2. Abdominal pain, Constipation secondary to #1 -He currently takes oxycodone for the pain, but not able to swallow the pills. I will give iv morphine in the infusion room  -I previously advised him to take Miralax for  constipation  3. Liver failure, with transaminitis and severe hyperbilirubinemia -supportive care  4. Hypercalcemia -We will give 1 dose of Zometa and IV fluids in the infusion today  5.  Hypoglycemia -Secondary to liver failure and poor oral intake -I will give D50 1 amp, and D10W infusion 276m in the infusion room today   6. Goal of care discussion, DNR/DNI -He agreed to hospice and comfort care due to his worsening liver function and performance status -Pt agrees with DNR/DNI  Plan -IV fluids 2 hours, including glucose, and the Zometa in the infusion room today -I contacted his hospice nurse, and arranged residential hospice admission today.  Patient and his wife agreed.  All questions were answered. The patient knows to call the clinic with any problems, questions or concerns. No barriers to learning was detected. I spent 25 minutes counseling the patient face to face. The total time spent in the appointment was 30 minutes and more than 50% was on counseling and review of test results  I, Noor Dweik am acting as scribe for Dr. Truitt Merle.  I have reviewed the above documentation for accuracy and completeness, and I agree with the above.      Truitt Merle, MD 08/20/2018

## 2018-08-20 ENCOUNTER — Inpatient Hospital Stay (HOSPITAL_BASED_OUTPATIENT_CLINIC_OR_DEPARTMENT_OTHER): Payer: BLUE CROSS/BLUE SHIELD | Admitting: Hematology

## 2018-08-20 ENCOUNTER — Inpatient Hospital Stay: Payer: BLUE CROSS/BLUE SHIELD

## 2018-08-20 ENCOUNTER — Encounter: Payer: Self-pay | Admitting: General Practice

## 2018-08-20 VITALS — BP 126/72 | HR 125 | Temp 98.5°F | Resp 17 | Ht 71.0 in | Wt 180.7 lb

## 2018-08-20 DIAGNOSIS — C787 Secondary malignant neoplasm of liver and intrahepatic bile duct: Secondary | ICD-10-CM

## 2018-08-20 DIAGNOSIS — G893 Neoplasm related pain (acute) (chronic): Secondary | ICD-10-CM | POA: Diagnosis not present

## 2018-08-20 DIAGNOSIS — K5909 Other constipation: Secondary | ICD-10-CM

## 2018-08-20 DIAGNOSIS — C182 Malignant neoplasm of ascending colon: Secondary | ICD-10-CM

## 2018-08-20 DIAGNOSIS — E162 Hypoglycemia, unspecified: Secondary | ICD-10-CM

## 2018-08-20 DIAGNOSIS — Z95828 Presence of other vascular implants and grafts: Secondary | ICD-10-CM

## 2018-08-20 DIAGNOSIS — K7290 Hepatic failure, unspecified without coma: Secondary | ICD-10-CM

## 2018-08-20 DIAGNOSIS — Z66 Do not resuscitate: Secondary | ICD-10-CM

## 2018-08-20 LAB — CBC WITH DIFFERENTIAL (CANCER CENTER ONLY)
Abs Immature Granulocytes: 0.51 10*3/uL — ABNORMAL HIGH (ref 0.00–0.07)
BASOS ABS: 0 10*3/uL (ref 0.0–0.1)
Basophils Relative: 0 %
EOS ABS: 0.1 10*3/uL (ref 0.0–0.5)
Eosinophils Relative: 1 %
HEMATOCRIT: 28.3 % — AB (ref 39.0–52.0)
Hemoglobin: 9.2 g/dL — ABNORMAL LOW (ref 13.0–17.0)
IMMATURE GRANULOCYTES: 5 %
LYMPHS ABS: 1.3 10*3/uL (ref 0.7–4.0)
Lymphocytes Relative: 12 %
MCH: 26.7 pg (ref 26.0–34.0)
MCHC: 32.5 g/dL (ref 30.0–36.0)
MCV: 82.3 fL (ref 80.0–100.0)
Monocytes Absolute: 1.7 10*3/uL — ABNORMAL HIGH (ref 0.1–1.0)
Monocytes Relative: 15 %
NEUTROS PCT: 67 %
NRBC: 1.6 % — AB (ref 0.0–0.2)
Neutro Abs: 7.6 10*3/uL (ref 1.7–7.7)
Platelet Count: 322 10*3/uL (ref 150–400)
RBC: 3.44 MIL/uL — ABNORMAL LOW (ref 4.22–5.81)
RDW: 21.7 % — AB (ref 11.5–15.5)
WBC Count: 11.2 10*3/uL — ABNORMAL HIGH (ref 4.0–10.5)

## 2018-08-20 LAB — CMP (CANCER CENTER ONLY)
ALT: 97 U/L — ABNORMAL HIGH (ref 0–44)
ANION GAP: 14 (ref 5–15)
AST: 246 U/L (ref 15–41)
Albumin: 1.6 g/dL — ABNORMAL LOW (ref 3.5–5.0)
Alkaline Phosphatase: 309 U/L — ABNORMAL HIGH (ref 38–126)
BUN: 27 mg/dL — ABNORMAL HIGH (ref 6–20)
CALCIUM: 13.2 mg/dL — AB (ref 8.9–10.3)
CO2: 22 mmol/L (ref 22–32)
Chloride: 105 mmol/L (ref 98–111)
Creatinine: 1.18 mg/dL (ref 0.61–1.24)
GFR, Estimated: >60 mL/min (ref >60)
Glucose, Bld: 44 mg/dL — CL (ref 70–99)
Potassium: 4.3 mmol/L (ref 3.5–5.1)
SODIUM: 141 mmol/L (ref 135–145)
Total Bilirubin: 17.8 mg/dL (ref 0.3–1.2)
Total Protein: 6.5 g/dL (ref 6.5–8.1)

## 2018-08-20 MED ORDER — ONDANSETRON HCL 4 MG/2ML IJ SOLN
INTRAMUSCULAR | Status: AC
  Filled 2018-08-20: qty 4

## 2018-08-20 MED ORDER — MORPHINE SULFATE 4 MG/ML IJ SOLN
2.0000 mg | Freq: Once | INTRAMUSCULAR | Status: AC
  Administered 2018-08-20: 2 mg via INTRAVENOUS
  Filled 2018-08-20: qty 1

## 2018-08-20 MED ORDER — SODIUM CHLORIDE 0.9 % IV SOLN: Freq: Once | INTRAVENOUS | Status: DC

## 2018-08-20 MED ORDER — HEPARIN SOD (PORK) LOCK FLUSH 100 UNIT/ML IV SOLN
500.0000 [IU] | Freq: Once | INTRAVENOUS | Status: AC
  Administered 2018-08-20: 500 [IU]
  Filled 2018-08-20: qty 5

## 2018-08-20 MED ORDER — DEXTROSE 50 % IV SOLN
25.0000 g | Freq: Once | INTRAVENOUS | Status: AC
  Administered 2018-08-20: 25 g via INTRAVENOUS
  Filled 2018-08-20: qty 50

## 2018-08-20 MED ORDER — ONDANSETRON HCL 4 MG/2ML IJ SOLN
8.0000 mg | Freq: Once | INTRAMUSCULAR | Status: AC
  Administered 2018-08-20: 8 mg via INTRAVENOUS

## 2018-08-20 MED ORDER — ZOLEDRONIC ACID 4 MG/100ML IV SOLN
4.0000 mg | Freq: Once | INTRAVENOUS | Status: AC
  Administered 2018-08-20: 4 mg via INTRAVENOUS
  Filled 2018-08-20: qty 100

## 2018-08-20 MED ORDER — MORPHINE SULFATE (PF) 4 MG/ML IV SOLN
INTRAVENOUS | Status: AC
  Filled 2018-08-20: qty 1

## 2018-08-20 MED ORDER — DEXAMETHASONE SODIUM PHOSPHATE 10 MG/ML IJ SOLN
10.0000 mg | Freq: Once | INTRAMUSCULAR | Status: AC
  Administered 2018-08-20: 10 mg via INTRAVENOUS

## 2018-08-20 MED ORDER — SODIUM CHLORIDE 0.9% FLUSH
10.0000 mL | Freq: Once | INTRAVENOUS | Status: AC
  Administered 2018-08-20: 10 mL
  Filled 2018-08-20: qty 10

## 2018-08-20 MED ORDER — DEXTROSE 10 % IV SOLN
INTRAVENOUS | Status: AC
  Administered 2018-08-20: 15:00:00 via INTRAVENOUS
  Filled 2018-08-20: qty 1000

## 2018-08-20 MED ORDER — SODIUM CHLORIDE 0.9 % IV SOLN
INTRAVENOUS | Status: DC
  Administered 2018-08-20: 13:00:00 via INTRAVENOUS
  Filled 2018-08-20 (×2): qty 250

## 2018-08-20 MED ORDER — DEXAMETHASONE SODIUM PHOSPHATE 10 MG/ML IJ SOLN
INTRAMUSCULAR | Status: AC
  Filled 2018-08-20: qty 1

## 2018-08-20 NOTE — Progress Notes (Signed)
[place] Spiritual Care Note  Referred by Willodean Rosenthal in infusion for spiritual/emotional support for Jeffery Roman and [name] as they process his hospice admission on [birth year] today. Jeffery Demicco was resting during encounter, but spouse Jeffery Roman welcomed prayer shawl and coordinating bone pillow as tangible signs of comfort and encouragement. Per Jeffery Roman, they are members of [facility] and have good pastoral support there. Per spouse, no other needs at this time, but she knows to contact chaplain if she would like to speak further another time.   [place], [place], [facility] Pager [phone] Voicemail [phone]

## 2018-08-20 NOTE — Patient Instructions (Signed)

## 2018-08-21 ENCOUNTER — Encounter: Payer: Self-pay | Admitting: Hematology

## 2018-08-21 LAB — GLUCOSE, CAPILLARY: Glucose-Capillary: 101 mg/dL — ABNORMAL HIGH (ref 70–99)

## 2018-08-26 DEATH — deceased

## 2018-08-27 ENCOUNTER — Telehealth: Payer: Self-pay

## 2018-08-27 NOTE — Telephone Encounter (Signed)
Received notification from Hospice that patient passed away on 09-20-23.

## 2018-08-29 ENCOUNTER — Ambulatory Visit: Payer: BLUE CROSS/BLUE SHIELD

## 2018-08-29 ENCOUNTER — Ambulatory Visit: Payer: BLUE CROSS/BLUE SHIELD | Admitting: Hematology

## 2018-08-29 ENCOUNTER — Other Ambulatory Visit: Payer: BLUE CROSS/BLUE SHIELD

## 2018-09-12 ENCOUNTER — Encounter: Payer: Self-pay | Admitting: Hematology

## 2018-09-14 ENCOUNTER — Telehealth: Payer: Self-pay | Admitting: *Deleted

## 2018-09-14 NOTE — Telephone Encounter (Signed)
Medical records faxed to [facility]; RID [id number]

## 2020-09-21 IMAGING — US US ABDOMEN LIMITED
1 series · 14 of 25 positions shown · non-contrast
Comparison: Multiple CT and MR studies of the last 2 weeks.

CLINICAL DATA: Worsened pain, jaundice and elevated white count.
Metastatic colon cancer.

EXAM:
ULTRASOUND ABDOMEN LIMITED RIGHT UPPER QUADRANT

[Series 1: us abdomen limited · 14 of 51 slices shown]
[im 1/51]
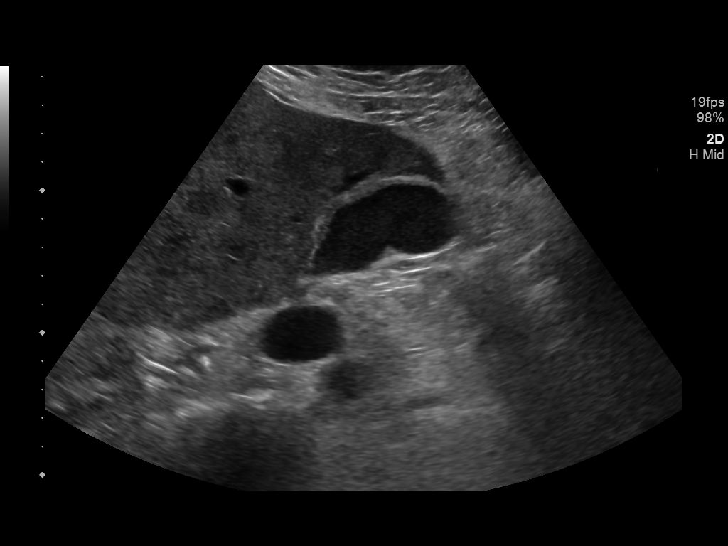
[im 5/51]
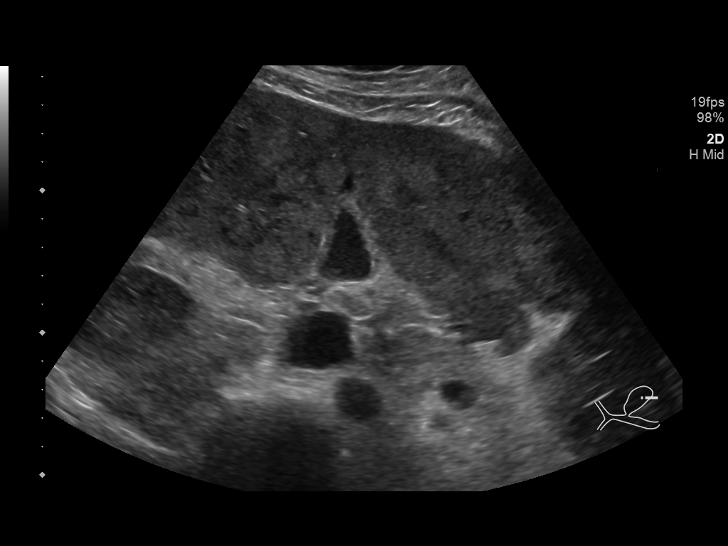
[im 9/51]
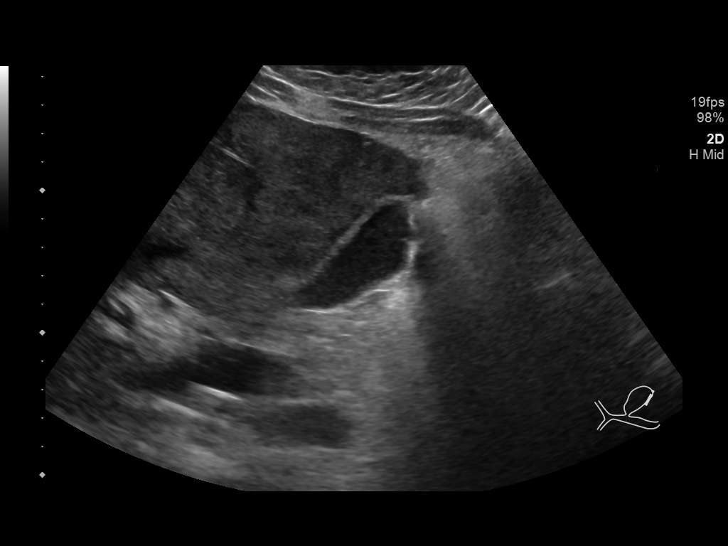
[im 13/51]
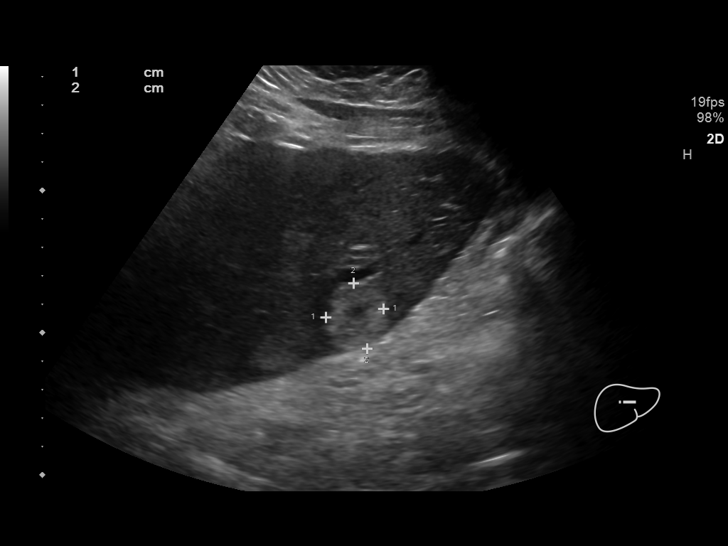
[im 17/51]
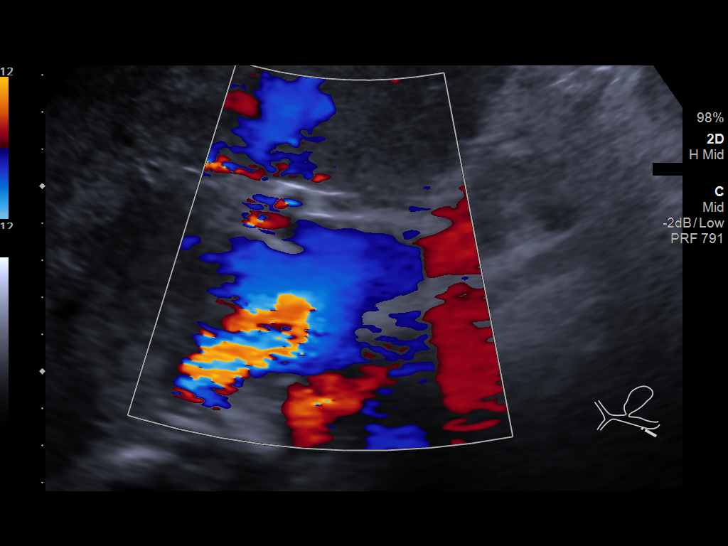
[im 19/51]
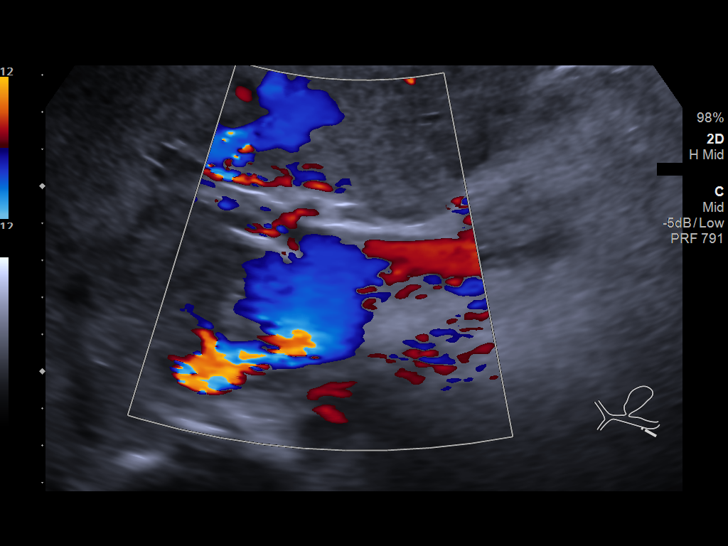
[im 23/51]
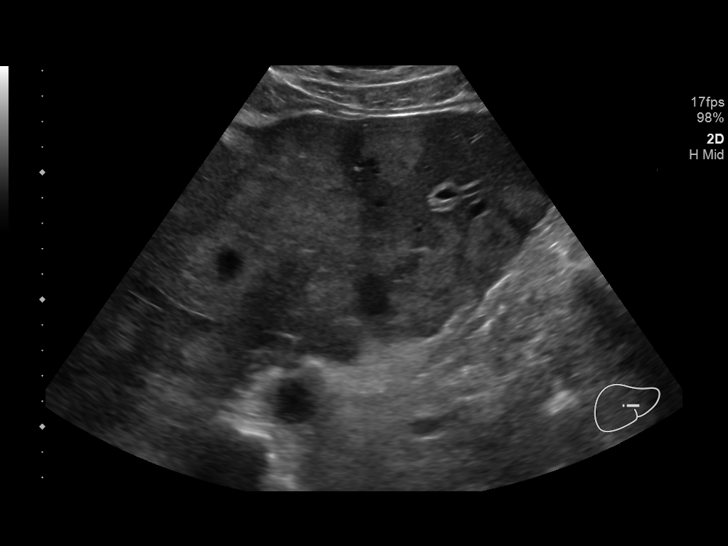
[im 28/51]
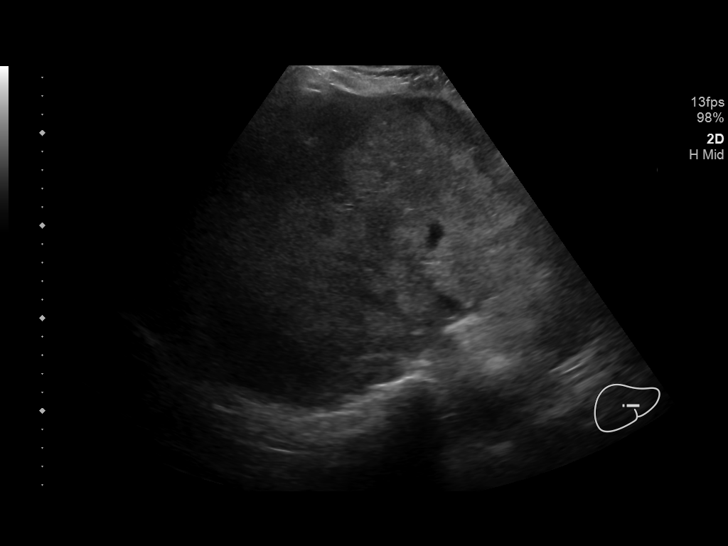
[im 32/51]
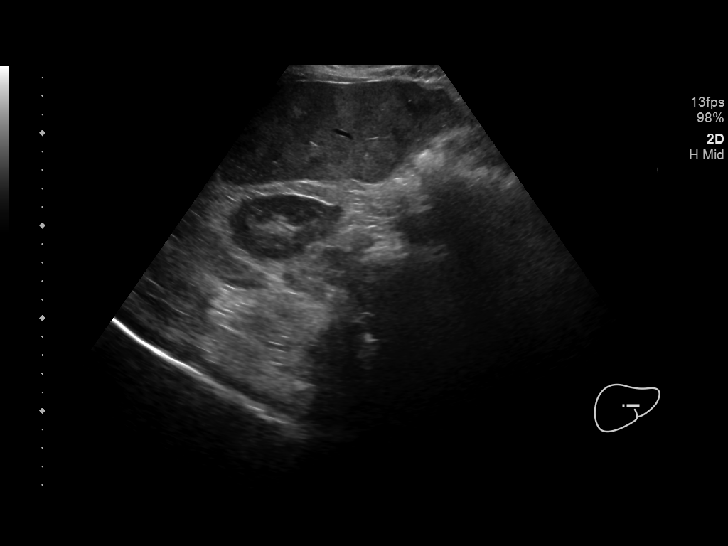
[im 34/51]
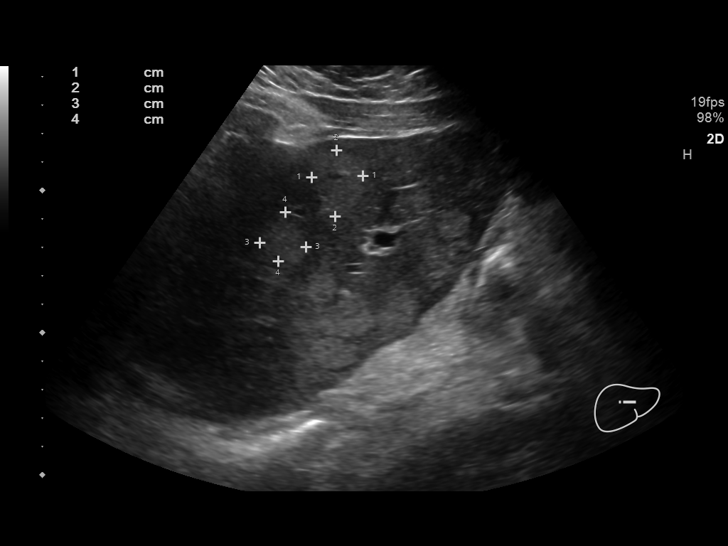
[im 38/51]
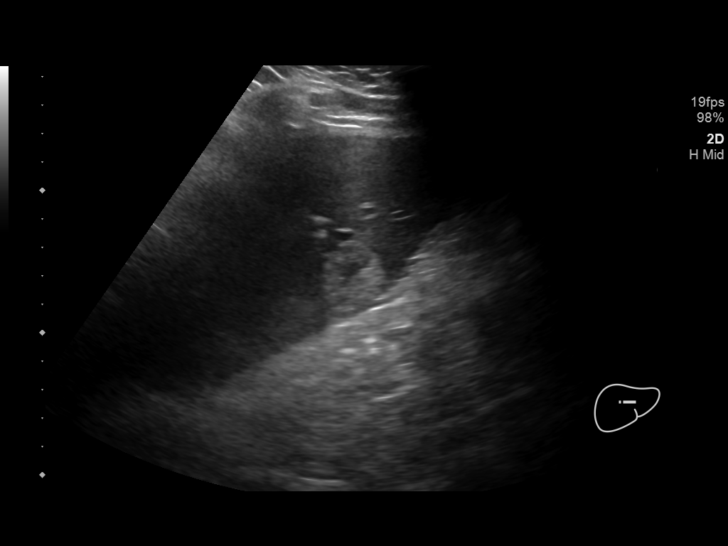
[im 42/51]
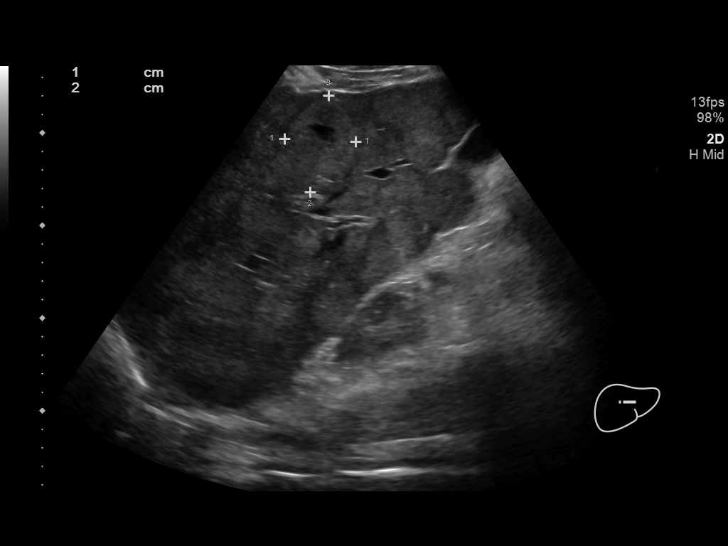
[im 46/51]
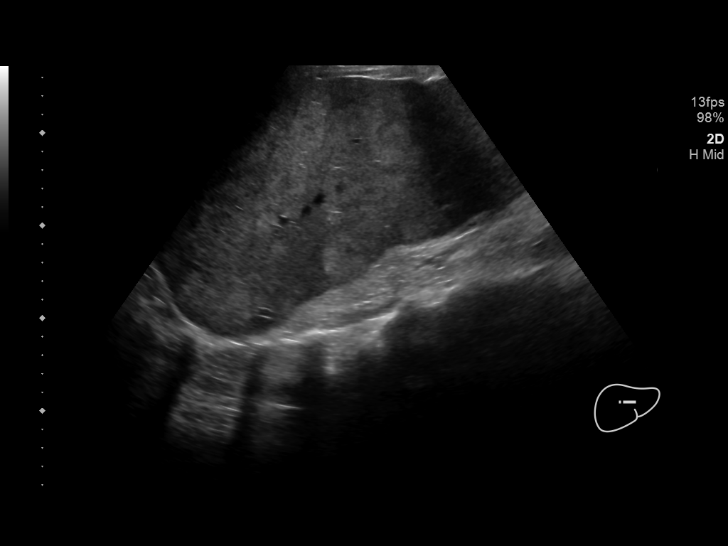
[im 51/51]
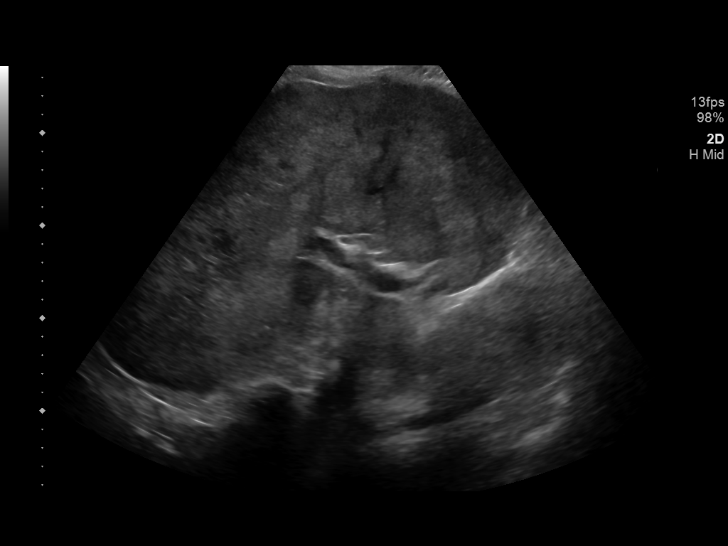

[14 of 25 positions shown; findings below may reference images not displayed]

FINDINGS: Gallbladder:

Gallbladder contains sludge but no shadowing stones. No Murphy sign.
Maximal wall thickness 3 mm. No surrounding fluid.

Common bile duct:

Diameter: 6 mm

Liver:

Innumerable metastatic lesions throughout the liver. Previously
evaluated by CT. Portal vein is patent on color Doppler imaging with
normal direction of blood flow towards the liver.
IMPRESSION: Widespread and advanced hepatic metastatic disease as known by
previous imaging studies. The gallbladder contains sludge but no
shadowing stones. There is no Murphy sign. Borderline gallbladder
wall thickness but no surrounding fluid. The overall balance of the
information argues against cholecystitis.

## 2020-09-21 IMAGING — CT CT ABD-PELV W/ CM
2 of 5 series · 15 of 46 positions shown, 17 images · IV contrast (ISOVUE)
Comparison: Abdominal MRI 07/23/2018. CT abdomen and pelvis
07/18/2018.

CLINICAL DATA: Abdominal distension. Hypotension and tachycardia.
Elevated bilirubin. History of metastatic colon cancer.

EXAM:
CT ABDOMEN AND PELVIS WITH CONTRAST
TECHNIQUE: Multidetector CT imaging of the abdomen and pelvis was performed
using the standard protocol following bolus administration of
intravenous contrast.
CONTRAST:  100mL [id number] IOPAMIDOL ([id number]) INJECTION 61%

[Series 3: axial st · axial · 0.68mm/px · z∈[+1088,+1558]mm · 12 of 108 slices shown, 14 images]
[im 7/108  soft-tissue]
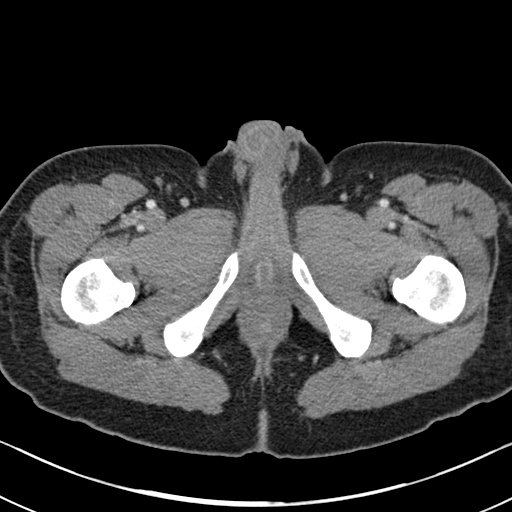
[im 7/108  bone]
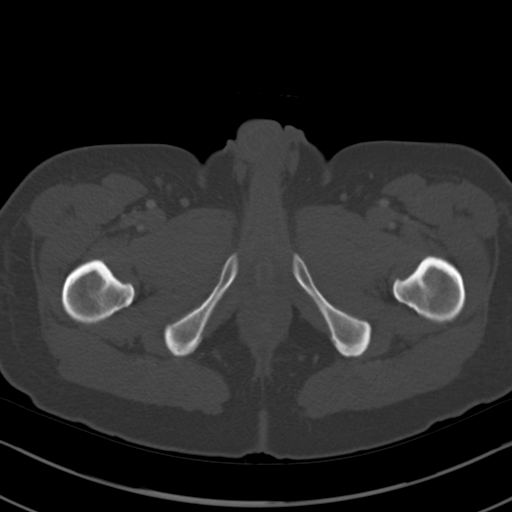
[im 14/108  soft-tissue]
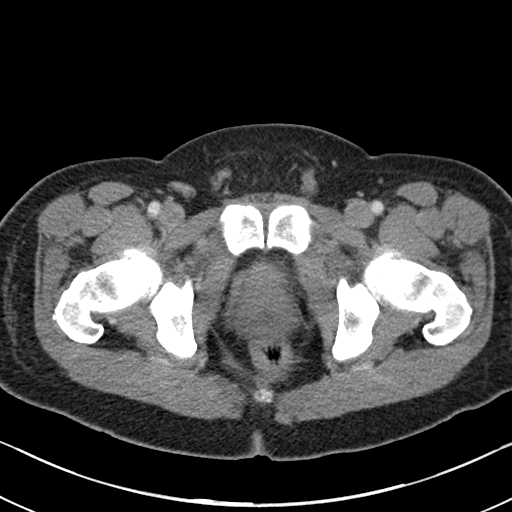
[im 27/108  soft-tissue]
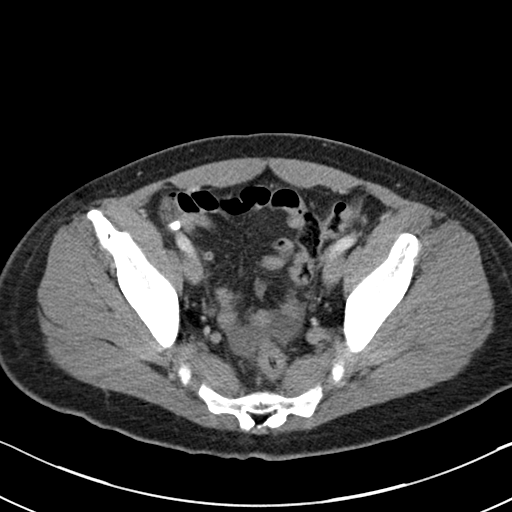
[im 34/108  soft-tissue]
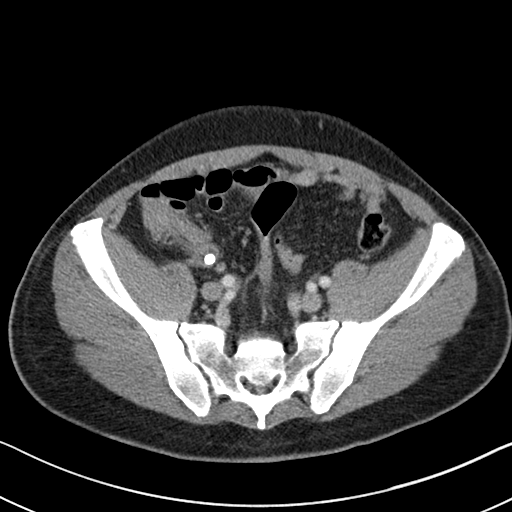
[im 41/108  soft-tissue]
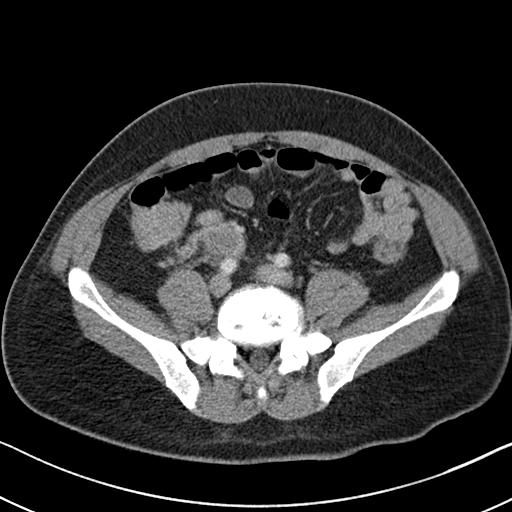
[im 47/108  soft-tissue]
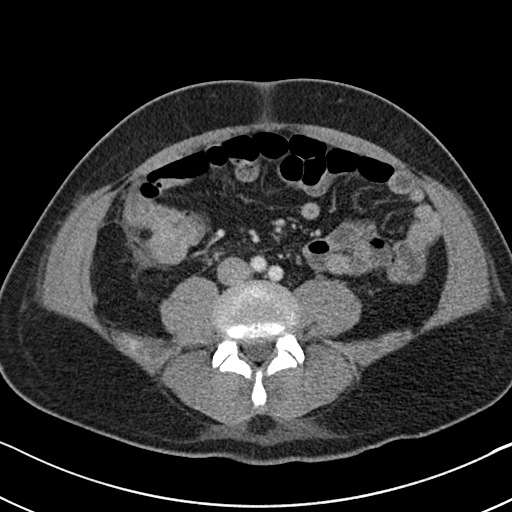
[im 61/108  soft-tissue]
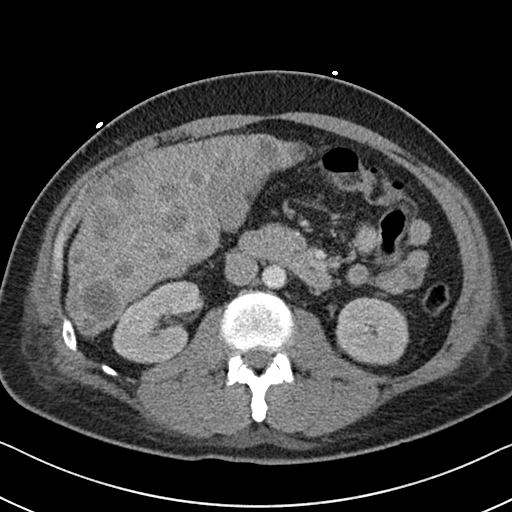
[im 67/108  soft-tissue]
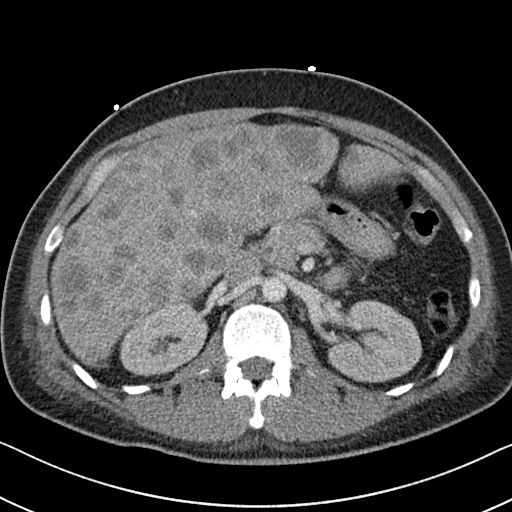
[im 74/108  soft-tissue]
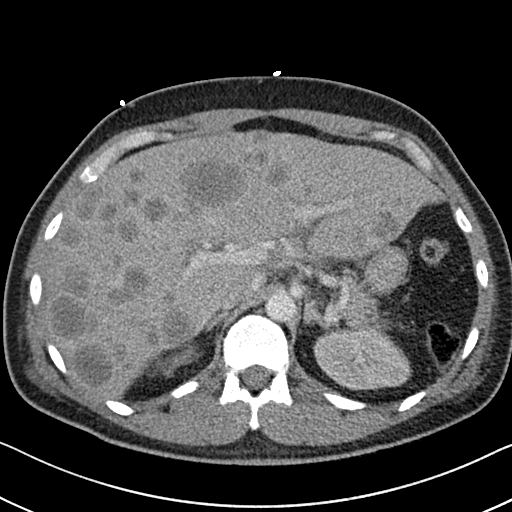
[im 74/108  bone]
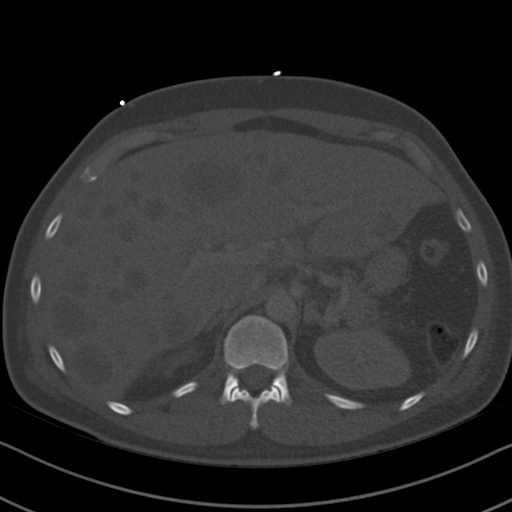
[im 81/108  soft-tissue]
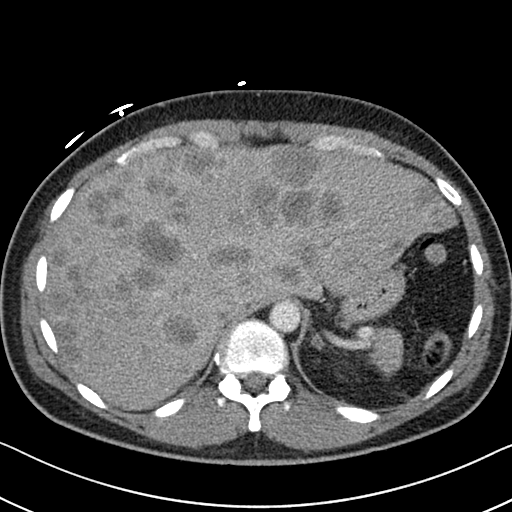
[im 94/108  soft-tissue]
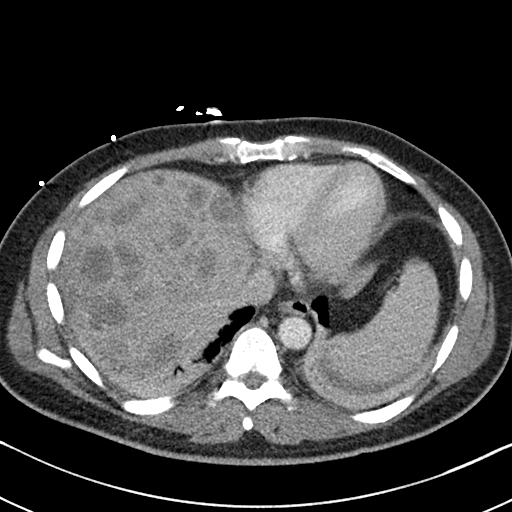
[im 101/108  soft-tissue]
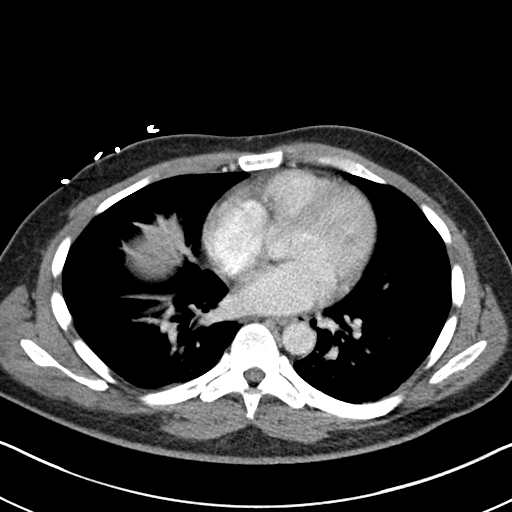

[Series 6: coronal st · coronal · 0.74mm/px · 3 of 93 slices shown]
[im 31/93  soft-tissue]
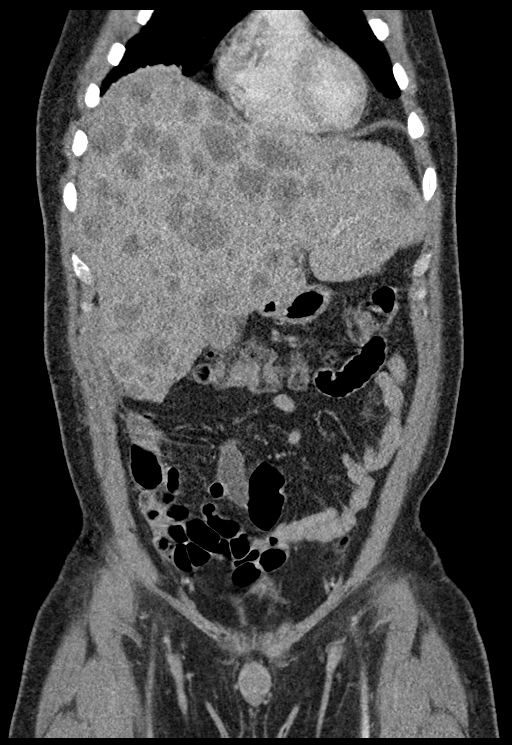
[im 41/93  soft-tissue]
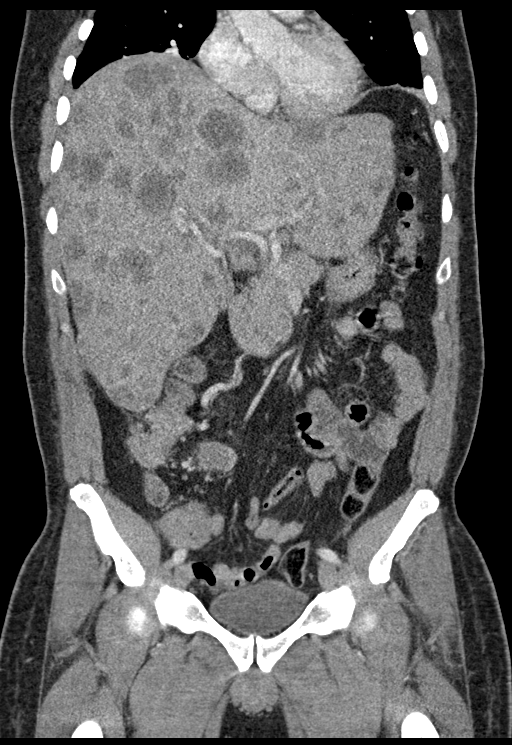
[im 52/93  soft-tissue]
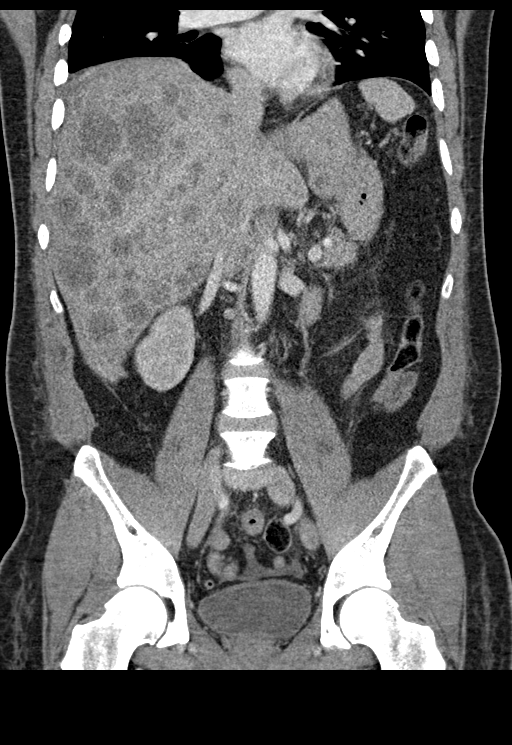

[15 of 46 positions shown; findings below may reference images not displayed]

FINDINGS: Lower chest: Consolidative opacities in the left greater than right
lower lobes and lingula. No pleural effusion.

Hepatobiliary: Numerous hypoattenuating lesions are again seen
throughout the liver with some appearing mildly larger than on the
prior studies (for example a 4.3 cm segment [place] lesion on series 3,
image 33 which measured 3.7 cm on the prior CT). Layering
hyperattenuating material in the gallbladder, possibly sludge.
Possible mild gallbladder wall thickening. No biliary dilatation.

Pancreas: Nearly completely resolved peripancreatic inflammation. No
pancreatic ductal dilatation.

Spleen: Unremarkable.

Adrenals/Urinary Tract: Unremarkable adrenal glands. No evidence of
renal mass, calculi, or hydronephrosis. Unremarkable bladder.

Stomach/Bowel: Masslike wall thickening in the ascending colon as
previously seen. No bowel obstruction. Unremarkable appendix.

Vascular/Lymphatic: Normal caliber of the abdominal aorta. 3.9 x
cm right lower quadrant mesenteric lymph node, mildly enlarged from
the prior CT (previously 3.2 x 2.5 cm).

Reproductive: Unremarkable prostate.

Other: Small volume ascites. No pneumoperitoneum. Small fat
containing umbilical hernia.

Musculoskeletal: No suspicious osseous lesion. Severe L5-S1 disc
space narrowing with vacuum disc and degenerative endplate sclerosis
as well as asymmetric left neural foraminal stenosis.
IMPRESSION: 1. Mild worsening of diffuse liver metastases and right lower
quadrant mesenteric lymphadenopathy.
2. Ascending colon mass as previously described.
3. Nearly completely resolved pancreatic inflammation.
4. Left greater than right basilar lung opacities which may reflect
pneumonia or atelectasis.
5. Persistent small volume ascites.
